# Patient Record
Sex: Male | Born: 1970
Health system: Southern US, Community
[De-identification: ages and names within clinical notes are randomized; demographics above are authoritative.]

## PROBLEM LIST (undated history)

## (undated) DIAGNOSIS — E663 Overweight: Secondary | ICD-10-CM

## (undated) DIAGNOSIS — R739 Hyperglycemia, unspecified: Secondary | ICD-10-CM

## (undated) DIAGNOSIS — I483 Typical atrial flutter: Secondary | ICD-10-CM

## (undated) DIAGNOSIS — I4891 Unspecified atrial fibrillation: Secondary | ICD-10-CM

## (undated) DIAGNOSIS — E785 Hyperlipidemia, unspecified: Secondary | ICD-10-CM

## (undated) DIAGNOSIS — I10 Essential (primary) hypertension: Secondary | ICD-10-CM

## (undated) HISTORY — DX: Overweight: E66.3

## (undated) HISTORY — PX: OTHER SURGICAL HISTORY: SHX169

## (undated) HISTORY — DX: Essential (primary) hypertension: I10

## (undated) HISTORY — DX: Hyperlipidemia, unspecified: E78.5

## (undated) HISTORY — DX: Typical atrial flutter: I48.3

## (undated) HISTORY — DX: Hyperglycemia, unspecified: R73.9

---

## 2000-09-11 ENCOUNTER — Emergency Department (HOSPITAL_COMMUNITY): Admission: EM | Admit: 2000-09-11 | Discharge: 2000-09-11 | Payer: Self-pay | Admitting: *Deleted

## 2001-10-15 ENCOUNTER — Emergency Department (HOSPITAL_COMMUNITY): Admission: EM | Admit: 2001-10-15 | Discharge: 2001-10-15 | Payer: Self-pay | Admitting: *Deleted

## 2001-10-15 ENCOUNTER — Encounter: Payer: Self-pay | Admitting: *Deleted

## 2008-09-03 ENCOUNTER — Emergency Department (HOSPITAL_COMMUNITY): Admission: EM | Admit: 2008-09-03 | Discharge: 2008-09-03 | Payer: Self-pay | Admitting: Emergency Medicine

## 2010-11-04 ENCOUNTER — Emergency Department (HOSPITAL_COMMUNITY)
Admission: EM | Admit: 2010-11-04 | Discharge: 2010-11-04 | Disposition: A | Discharge status: Home / Self Care | Payer: 59 | Attending: Emergency Medicine | Admitting: Emergency Medicine

## 2010-11-04 DIAGNOSIS — Y990 Civilian activity done for income or pay: Secondary | ICD-10-CM | POA: Insufficient documentation

## 2010-11-04 DIAGNOSIS — S01501A Unspecified open wound of lip, initial encounter: Secondary | ICD-10-CM | POA: Insufficient documentation

## 2010-11-04 DIAGNOSIS — W268XXA Contact with other sharp object(s), not elsewhere classified, initial encounter: Secondary | ICD-10-CM | POA: Insufficient documentation

## 2010-11-04 DIAGNOSIS — S0180XA Unspecified open wound of other part of head, initial encounter: Secondary | ICD-10-CM | POA: Insufficient documentation

## 2016-04-08 ENCOUNTER — Ambulatory Visit (INDEPENDENT_AMBULATORY_CARE_PROVIDER_SITE_OTHER): Payer: 59 | Admitting: Primary Care

## 2016-04-08 ENCOUNTER — Ambulatory Visit (INDEPENDENT_AMBULATORY_CARE_PROVIDER_SITE_OTHER)
Admission: RE | Admit: 2016-04-08 | Discharge: 2016-04-08 | Disposition: A | Discharge status: Home / Self Care | Payer: 59 | Source: Ambulatory Visit | Attending: Primary Care | Admitting: Primary Care

## 2016-04-08 ENCOUNTER — Encounter: Payer: Self-pay | Admitting: Primary Care

## 2016-04-08 VITALS — BP 152/92 | HR 94 | Temp 98.7°F | Ht 67.0 in | Wt 212.4 lb

## 2016-04-08 DIAGNOSIS — I10 Essential (primary) hypertension: Secondary | ICD-10-CM

## 2016-04-08 DIAGNOSIS — E119 Type 2 diabetes mellitus without complications: Secondary | ICD-10-CM | POA: Insufficient documentation

## 2016-04-08 DIAGNOSIS — R0781 Pleurodynia: Secondary | ICD-10-CM

## 2016-04-08 DIAGNOSIS — R739 Hyperglycemia, unspecified: Secondary | ICD-10-CM | POA: Diagnosis not present

## 2016-04-08 LAB — HEMOGLOBIN A1C: Hgb A1c MFr Bld: 8 % — ABNORMAL HIGH (ref 4.6–6.5)

## 2016-04-08 NOTE — Progress Notes (Signed)
Pre visit review using our clinic review tool, if applicable. No additional management support is needed unless otherwise documented below in the visit note. 

## 2016-04-08 NOTE — Progress Notes (Signed)
Subjective:    Patient ID: Kevin Branch, male    DOB: August 13, 1970, 46 y.o.   MRN: [id number]  HPI  Mr. Lamb is a 46 year old male who presents today to establish care and discuss the problems mentioned below. Will obtain old records.  1) Elevated Blood Pressure Reading: History of elevated blood pressure readings for the past two years. He has a physical through the fire department annually, had BP checked in February which was 140's/80's. He's checking his BP at home and is getting readings of 140-150's/90's. His BP in the clinic today is 152/92.   He endorses an unhealthy lifestyle for 2-3 years, but recently has changed his lifestyle over the past several weeks with healthy diet and exercise. He's already lost 10 pounds. He's recently stopped drinking soda, cut back on fast/fried foods, and is exercising. He denies chest pain, dizziness, fatigue, shortness of breath.  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 He's not sure if he was fasting during these labs. He denies polyuria, dizziness, numbness/tingling.  3) Rib Pain: Located to left rib since a fall during Thanksgiving 2017. He was diagnosed with a contusion. He continues to experience pain to the left lateral ribs. He did have a bad cold in February that caused increased pain to his rib. The pain to his rib is constant.   Review of Systems  Constitutional: Negative for fatigue.  Eyes: Negative for visual disturbance.  Respiratory: Negative for shortness of breath.   Cardiovascular: Negative for chest pain.  Musculoskeletal: Positive for arthralgias.  Neurological: Negative for dizziness and headaches.       Past Medical History:  Diagnosis Date  . Essential hypertension   . Hyperglycemia   . Hyperlipidemia      Social History   Social History  . Marital status: Married    Spouse name: N/A  . Number of children: N/A  . Years of education: N/A    Occupational History  . Not on file.   Social History Main Topics  . Smoking status: Smoker, Current Status Unknown    Types: Cigars  . Smokeless tobacco: Never Used  . Alcohol use Yes  . Drug use: Unknown  . Sexual activity: Not on file   Other Topics Concern  . Not on file   Social History Narrative   Married.   6 children.   Works for the [employer].   Enjoys hunting and fishing.     No past surgical history on file.  Family History  Problem Relation Age of Onset  . Hyperlipidemia Mother   . Heart disease Mother   . Diabetes Mother   . Hyperlipidemia Father     No Known Allergies  No current outpatient prescriptions on file prior to visit.   No current facility-administered medications on file prior to visit.     BP (!) 152/92   Pulse 94   Temp 98.7 F (37.1 C) (Oral)   Ht 5\' 7"  (1.702 m)   Wt 212 lb 6.4 oz (96.3 kg)   SpO2 95%   BMI 33.27 kg/m    Objective:   Physical Exam  Constitutional: He is oriented to person, place, and time. He appears well-nourished.  Neck: Neck supple.  Cardiovascular: Normal rate and regular rhythm.   Pulmonary/Chest: Effort normal and breath sounds normal. He has no wheezes. He has no rales.  Neurological: He is alert and oriented to person, place, and time.  Skin: Skin is warm and dry.  Psychiatric: He has a normal mood and affect.          Assessment & Plan:  Rib Pain:  Located to left lateral ribs since Fall 2017. Exam today with mild tenderness with deep palpation. Suspect recent viral illness exacerbated symptoms. Will check xray today.   Morrie Sheldonlark,Devani Odonnel Kendal, NP

## 2016-04-08 NOTE — Patient Instructions (Addendum)
Complete lab work and xray prior to leaving today. I will notify you of your results once received.   Continue to monitor your blood pressure, record your readings. Make sure you check your blood pressure around the same time of day no more than once daily.  Schedule a follow up visit in three months for re-evaluation of your blood pressure.   It was a pleasure to meet you today! Please don't hesitate to call me with any questions. Welcome to [facility]!   DASH Eating Plan DASH stands for "Dietary Approaches to Stop Hypertension." The DASH eating plan is a healthy eating plan that has been shown to reduce high blood pressure (hypertension). It may also reduce your risk for type 2 diabetes, heart disease, and stroke. The DASH eating plan may also help with weight loss. What are tips for following this plan? General guidelines   Avoid eating more than 2,300 mg (milligrams) of salt (sodium) a day. If you have hypertension, you may need to reduce your sodium intake to 1,500 mg a day.  Limit alcohol intake to no more than 1 drink a day for nonpregnant women and 2 drinks a day for men. One drink equals 12 oz of beer, 5 oz of wine, or 1 oz of hard liquor.  Work with your health care provider to maintain a healthy body weight or to lose weight. Ask what an ideal weight is for you.  Get at least 30 minutes of exercise that causes your heart to beat faster (aerobic exercise) most days of the week. Activities may include walking, swimming, or biking.  Work with your health care provider or diet and nutrition specialist (dietitian) to adjust your eating plan to your individual calorie needs. Reading food labels   Check food labels for the amount of sodium per serving. Choose foods with less than 5 percent of the Daily Value of sodium. Generally, foods with less than 300 mg of sodium per serving fit into this eating plan.  To find whole grains, look for the word "whole" as the first word in the  ingredient list. Shopping   Buy products labeled as "low-sodium" or "no salt added."  Buy fresh foods. Avoid canned foods and premade or frozen meals. Cooking   Avoid adding salt when cooking. Use salt-free seasonings or herbs instead of table salt or sea salt. Check with your health care provider or pharmacist before using salt substitutes.  Do not fry foods. Cook foods using healthy methods such as baking, boiling, grilling, and broiling instead.  Cook with heart-healthy oils, such as olive, canola, soybean, or sunflower oil. Meal planning    Eat a balanced diet that includes:  5 or more servings of fruits and vegetables each day. At each meal, try to fill half of your plate with fruits and vegetables.  Up to 6-8 servings of whole grains each day.  Less than 6 oz of lean meat, poultry, or fish each day. A 3-oz serving of meat is about the same size as a deck of cards. One egg equals 1 oz.  2 servings of low-fat dairy each day.  A serving of nuts, seeds, or beans 5 times each week.  Heart-healthy fats. Healthy fats called Omega-3 fatty acids are found in foods such as flaxseeds and coldwater fish, like sardines, salmon, and mackerel.  Limit how much you eat of the following:  Canned or prepackaged foods.  Food that is high in trans fat, such as fried foods.  Food that is high  in saturated fat, such as fatty meat.  Sweets, desserts, sugary drinks, and other foods with added sugar.  Full-fat dairy products.  Do not salt foods before eating.  Try to eat at least 2 vegetarian meals each week.  Eat more home-cooked food and less restaurant, buffet, and fast food.  When eating at a restaurant, ask that your food be prepared with less salt or no salt, if possible. What foods are recommended? The items listed may not be a complete list. Talk with your dietitian about what dietary choices are best for you. Grains  Whole-grain or whole-wheat bread. Whole-grain or  whole-wheat pasta. Brown rice. Kevin Branch. Quinoa. Bulgur. Whole-grain and low-sodium cereals. Pita bread. Low-fat, low-sodium crackers. Whole-wheat flour tortillas. Vegetables  Fresh or frozen vegetables (raw, steamed, roasted, or grilled). Low-sodium or reduced-sodium tomato and vegetable juice. Low-sodium or reduced-sodium tomato sauce and tomato paste. Low-sodium or reduced-sodium canned vegetables. Fruits  All fresh, dried, or frozen fruit. Canned fruit in natural juice (without added sugar). Meat and other protein foods  Skinless chicken or Kevin Branch. Ground chicken or Kevin Branch. Pork with fat trimmed off. Fish and seafood. Egg whites. Dried beans, peas, or lentils. Unsalted nuts, nut butters, and seeds. Unsalted canned beans. Lean cuts of beef with fat trimmed off. Low-sodium, lean deli meat. Dairy  Low-fat (1%) or fat-free (skim) milk. Fat-free, low-fat, or reduced-fat cheeses. Nonfat, low-sodium ricotta or cottage cheese. Low-fat or nonfat yogurt. Low-fat, low-sodium cheese. Fats and oils  Soft margarine without trans fats. Vegetable oil. Low-fat, reduced-fat, or light mayonnaise and salad dressings (reduced-sodium). Canola, safflower, olive, soybean, and sunflower oils. Avocado. Seasoning and other foods  Herbs. Spices. Seasoning mixes without salt. Unsalted popcorn and pretzels. Fat-free sweets. What foods are not recommended? The items listed may not be a complete list. Talk with your dietitian about what dietary choices are best for you. Grains  Baked goods made with fat, such as croissants, muffins, or some breads. Dry pasta or rice meal packs. Vegetables  Creamed or fried vegetables. Vegetables in a cheese sauce. Regular canned vegetables (not low-sodium or reduced-sodium). Regular canned tomato sauce and paste (not low-sodium or reduced-sodium). Regular tomato and vegetable juice (not low-sodium or reduced-sodium). Kevin Branch. Olives. Fruits  Canned fruit in a light or heavy syrup. Fried  fruit. Fruit in cream or butter sauce. Meat and other protein foods  Fatty cuts of meat. Ribs. Fried meat. Kevin Branch. Sausage. Bologna and other processed lunch meats. Salami. Fatback. Hotdogs. Bratwurst. Salted nuts and seeds. Canned beans with added salt. Canned or smoked fish. Whole eggs or egg yolks. Chicken or Kevin Branch with skin. Dairy  Whole or 2% milk, cream, and half-and-half. Whole or full-fat cream cheese. Whole-fat or sweetened yogurt. Full-fat cheese. Nondairy creamers. Whipped toppings. Processed cheese and cheese spreads. Fats and oils  Butter. Stick margarine. Lard. Shortening. Ghee. Bacon fat. Tropical oils, such as coconut, palm kernel, or palm oil. Seasoning and other foods  Salted popcorn and pretzels. Onion salt, garlic salt, seasoned salt, table salt, and sea salt. Worcestershire sauce. Tartar sauce. Barbecue sauce. Teriyaki sauce. Soy sauce, including reduced-sodium. Steak sauce. Canned and packaged gravies. Fish sauce. Oyster sauce. Cocktail sauce. Horseradish that you find on the shelf. Ketchup. Mustard. Meat flavorings and tenderizers. Bouillon cubes. Hot sauce and Tabasco sauce. Premade or packaged marinades. Premade or packaged taco seasonings. Relishes. Regular salad dressings. Where to find more information:  National Heart, Lung, and Blood Institute: PopSteam.iswww.nhlbi.nih.gov  American Heart Association: www.heart.org Summary  The DASH eating plan is a healthy  eating plan that has been shown to reduce high blood pressure (hypertension). It may also reduce your risk for type 2 diabetes, heart disease, and stroke.  With the DASH eating plan, you should limit salt (sodium) intake to 2,300 mg a day. If you have hypertension, you may need to reduce your sodium intake to 1,500 mg a day.  When on the DASH eating plan, aim to eat more fresh fruits and vegetables, whole grains, lean proteins, low-fat dairy, and heart-healthy fats.  Work with your health care provider or diet and  nutrition specialist (dietitian) to adjust your eating plan to your individual calorie needs. This information is not intended to replace advice given to you by your health care provider. Make sure you discuss any questions you have with your health care provider. Document Released: 12/18/2010 Document Revised: 12/23/2015 Document Reviewed: 12/23/2015 Elsevier Interactive Patient Education  2017 [employer].

## 2016-04-08 NOTE — Assessment & Plan Note (Signed)
Glucose of 153 on labs, thinks he might have fasted. Will check A1C today. Encouraged healthy lifestyle through exercise and diet. Continue to monitor.

## 2016-04-08 NOTE — Assessment & Plan Note (Signed)
Above goal during home readings and also today. He is very motivated to work on weight loss through healthy diet and exercise. Will allow him three months to work on this, follow up in three months for re-evaluation.

## 2016-04-09 ENCOUNTER — Other Ambulatory Visit: Payer: Self-pay | Admitting: Primary Care

## 2016-04-09 DIAGNOSIS — E119 Type 2 diabetes mellitus without complications: Secondary | ICD-10-CM

## 2016-04-09 MED ORDER — METFORMIN HCL 500 MG PO TABS: 500.0000 mg | ORAL_TABLET | Freq: Two times a day (BID) | ORAL | 1 refills | Status: DC

## 2016-07-03 ENCOUNTER — Ambulatory Visit (INDEPENDENT_AMBULATORY_CARE_PROVIDER_SITE_OTHER): Payer: 59 | Admitting: Primary Care

## 2016-07-03 ENCOUNTER — Encounter: Payer: Self-pay | Admitting: Primary Care

## 2016-07-03 VITALS — BP 142/82 | HR 83 | Temp 98.0°F | Ht 67.0 in | Wt 196.4 lb

## 2016-07-03 DIAGNOSIS — E119 Type 2 diabetes mellitus without complications: Secondary | ICD-10-CM

## 2016-07-03 DIAGNOSIS — E785 Hyperlipidemia, unspecified: Secondary | ICD-10-CM

## 2016-07-03 DIAGNOSIS — I10 Essential (primary) hypertension: Secondary | ICD-10-CM

## 2016-07-03 LAB — LIPID PANEL
Cholesterol: 151 mg/dL (ref 0–200)
HDL: 45.2 mg/dL (ref >39.00)
LDL Cholesterol: 92 mg/dL (ref 0–99)
NonHDL: 105.85
Total CHOL/HDL Ratio: 3
Triglycerides: 70 mg/dL (ref 0.0–149.0)
VLDL: 14 mg/dL (ref 0.0–40.0)

## 2016-07-03 LAB — MICROALBUMIN / CREATININE URINE RATIO
Creatinine,U: 26.7 mg/dL
Microalb Creat Ratio: 19.5 mg/g (ref 0.0–30.0)
Microalb, Ur: 5.2 mg/dL — ABNORMAL HIGH (ref 0.0–1.9)

## 2016-07-03 LAB — HEMOGLOBIN A1C: Hgb A1c MFr Bld: 6.2 % (ref 4.6–6.5)

## 2016-07-03 NOTE — Assessment & Plan Note (Signed)
Improved with weight loss, not quite at goal. If urine microalbumin negative then will have him continue to work on diet and exercise; if positive then add in ACE.  Follow up in 3 months for BP check. Home readings have significantly improved.

## 2016-07-03 NOTE — Patient Instructions (Signed)
Complete lab work prior to leaving today. I will notify you of your results once received.   Continue to monitor your blood pressure.  Congratulations on your weight loss!  Continue exercising. You should be getting 150 minutes of moderate intensity exercise weekly.  Continue to work on improvements in your diet.  Follow up in 3 months for blood pressure check.  It was a pleasure to see you today!

## 2016-07-03 NOTE — Assessment & Plan Note (Signed)
Due for repeat A1C today. Never filled Metformin. Commended him on his weight loss through healthy diet and exercise. Urine microalbumin pending. Foot exam unremarkable. Lipids pending.

## 2016-07-03 NOTE — Progress Notes (Signed)
Subjective:    Patient ID: Basilio CairoSidney R Gonce, male    DOB: 01-29-1970, 46 y.o.   MRN: [id number]  HPI  Mr. Elliot GurneyWoody is a 46 year old male who presents today for follow up.  1) Essential Hypertension: Evaluated last in late March 2018 with elevated readings. He wanted to work on lifestyle changes rather than starting medication and is here today for follow up.  His BP in the office today is 142/82. He's also lost 16 pounds through healthy diet and regular exercise. He is still very motivated to further lower his BP. He's been checking his BP at home which is running 130's/70-80's. He denies headaches, chest pain, shortness of breath.   Wt Readings from Last 3 Encounters:  07/03/16 196 lb 6.4 oz (89.1 kg)  04/08/16 212 lb 6.4 oz (96.3 kg)     BP Readings from Last 3 Encounters:  07/03/16 (!) 142/82  04/08/16 (!) 152/92    2) Type 2 Diabetes: Currently prescribed Metformin 500 mg BID. Last A1C of 8.0, no prior history of diabetes. He's working on lifestyle changes through healthy diet/exercise and has lost 16 pounds. He is due for repeat A1C today. He's checking his blood sugars twice daily. His fasting sugars were running 80-90's when working out, now running 110-115 when he doesn't work out. He's taking natural supplements. He never filled the Metformin as prescribed. He's stopped dipping tobacco and drinking alcohol since his last visit. He denies numbness/tingling, fatigue, polyuria.  Review of Systems  Constitutional: Negative for fatigue.  Respiratory: Negative for shortness of breath.   Cardiovascular: Negative for chest pain.  Neurological: Negative for dizziness, numbness and headaches.       Past Medical History:  Diagnosis Date  . Essential hypertension   . Hyperglycemia   . Hyperlipidemia      Social History   Social History  . Marital status: Married    Spouse name: N/A  . Number of children: N/A  . Years of education: N/A   Occupational History  . Not on file.     Social History Main Topics  . Smoking status: Former Smoker    Types: Cigars  . Smokeless tobacco: Never Used  . Alcohol use Yes  . Drug use: Unknown  . Sexual activity: Not on file   Other Topics Concern  . Not on file   Social History Narrative   Married.   6 children.   Works for the [employer].   Enjoys hunting and fishing.     No past surgical history on file.  Family History  Problem Relation Age of Onset  . Hyperlipidemia Mother   . Heart disease Mother   . Diabetes Mother   . Hyperlipidemia Father     No Known Allergies  Current Outpatient Prescriptions on File Prior to Visit  Medication Sig Dispense Refill  . metFORMIN (GLUCOPHAGE) 500 MG tablet Take 1 tablet (500 mg total) by mouth 2 (two) times daily with a meal. (Patient not taking: Reported on 07/03/2016) 180 tablet 1   No current facility-administered medications on file prior to visit.     BP (!) 142/82   Pulse 83   Temp 98 F (36.7 C) (Oral)   Ht 5\' 7"  (1.702 m)   Wt 196 lb 6.4 oz (89.1 kg)   SpO2 96%   BMI 30.76 kg/m    Objective:   Physical Exam  Constitutional: He appears well-nourished.  Neck: Neck supple.  Cardiovascular: Normal rate and regular rhythm.  Pulmonary/Chest: Effort normal and breath sounds normal.  Skin: Skin is warm and dry.  Fungal involvement to nearly all toenails          Assessment & Plan:

## 2016-07-03 NOTE — Assessment & Plan Note (Addendum)
LDL from labs in February 2018 slightly above goal. Repeat lipids pending.

## 2016-07-08 ENCOUNTER — Encounter: Payer: Self-pay | Admitting: *Deleted

## 2016-09-16 ENCOUNTER — Ambulatory Visit: Payer: 59 | Admitting: Primary Care

## 2016-09-22 ENCOUNTER — Encounter: Payer: Self-pay | Admitting: Primary Care

## 2016-09-22 ENCOUNTER — Ambulatory Visit (INDEPENDENT_AMBULATORY_CARE_PROVIDER_SITE_OTHER): Payer: 59 | Admitting: Primary Care

## 2016-09-22 VITALS — BP 140/78 | HR 75 | Temp 98.1°F | Ht 67.0 in | Wt 210.1 lb

## 2016-09-22 DIAGNOSIS — I10 Essential (primary) hypertension: Secondary | ICD-10-CM

## 2016-09-22 MED ORDER — LISINOPRIL 10 MG PO TABS: 10.0000 mg | ORAL_TABLET | Freq: Every day | ORAL | 0 refills | Status: DC

## 2016-09-22 NOTE — Progress Notes (Signed)
   Subjective:    Patient ID: Kevin Branch, male    DOB: 06/22/1970, 46 y.o.   MRN: [id number]  HPI  Kevin Branch is a 46 year old male who presents today who presents today for follow up of hypertension. Originally evaluated in March 2018 with numerous elevated readings, including home readings. He wanted to work on weight loss through diet and exercise rather than starting medication. During his follow up visit in June 2018 his blood pressure was still slightly above goal, but overall improved. It was recommended he start a low dose lisinopril given positive urine microalbumin, diabetes, and elevated blood pressure readings. He chose not to start lisinopril   His BP in the office today is 140/78. He has put on some weight since his last visit as he had a back injury and has not been exercising. He has also made poor choices with his diet. He's checking his BP infrequently at home, last reading two days ago was 138/82. He denies chest pain, dizziness, shortness of breath, headaches.  Review of Systems  Eyes: Negative for visual disturbance.  Respiratory: Negative for shortness of breath.   Cardiovascular: Negative for chest pain.  Neurological: Negative for dizziness and headaches.       Past Medical History:  Diagnosis Date  . Essential hypertension   . Hyperglycemia   . Hyperlipidemia      Social History   Social History  . Marital status: Married    Spouse name: N/A  . Number of children: N/A  . Years of education: N/A   Occupational History  . Not on file.   Social History Main Topics  . Smoking status: Former Smoker    Types: Cigars  . Smokeless tobacco: Never Used  . Alcohol use Yes  . Drug use: Unknown  . Sexual activity: Not on file   Other Topics Concern  . Not on file   Social History Narrative   Married.   6 children.   Works for the [employer].   Enjoys hunting and fishing.     No past surgical history on file.  Family History    Problem Relation Age of Onset  . Hyperlipidemia Mother   . Heart disease Mother   . Diabetes Mother   . Hyperlipidemia Father     No Known Allergies  No current outpatient prescriptions on file prior to visit.   No current facility-administered medications on file prior to visit.     BP 140/78   Pulse 75   Temp 98.1 F (36.7 C) (Oral)   Ht 5\' 7"  (1.702 m)   Wt 210 lb 1.9 oz (95.3 kg)   SpO2 96%   BMI 32.91 kg/m    Objective:   Physical Exam  Constitutional: He appears well-nourished.  Neck: Neck supple.  Cardiovascular: Normal rate and regular rhythm.   Pulmonary/Chest: Effort normal and breath sounds normal.  Skin: Skin is warm and dry.          Assessment & Plan:

## 2016-09-22 NOTE — Patient Instructions (Addendum)
Start lisinopril 10 mg tablets for high blood pressure and kidney protection. Take 1 tablet by mouth every morning.  Check your blood pressure daily, around the same time of day, for the next 2 weeks.  Ensure that you have rested for 30 minutes prior to checking your blood pressure. Record your readings and I'll call you in two weeks for readings.  Continue to work on weight loss through healthy diet and exercise.   Schedule a follow up visit in 3 months for re-evaluation of diabetes and blood pressure.  It was a pleasure to see you today!  Diabetes Mellitus and Food It is important for you to manage your blood sugar (glucose) level. Your blood glucose level can be greatly affected by what you eat. Eating healthier foods in the appropriate amounts throughout the day at about the same time each day will help you control your blood glucose level. It can also help slow or prevent worsening of your diabetes mellitus. Healthy eating may even help you improve the level of your blood pressure and reach or maintain a healthy weight. General recommendations for healthful eating and cooking habits include:  Eating meals and snacks regularly. Avoid going long periods of time without eating to lose weight.  Eating a diet that consists mainly of plant-based foods, such as fruits, vegetables, nuts, legumes, and whole grains.  Using low-heat cooking methods, such as baking, instead of high-heat cooking methods, such as deep frying.  Work with your dietitian to make sure you understand how to use the Nutrition Facts information on food labels. How can food affect me? Carbohydrates Carbohydrates affect your blood glucose level more than any other type of food. Your dietitian will help you determine how many carbohydrates to eat at each meal and teach you how to count carbohydrates. Counting carbohydrates is important to keep your blood glucose at a healthy level, especially if you are using insulin or taking  certain medicines for diabetes mellitus. Alcohol Alcohol can cause sudden decreases in blood glucose (hypoglycemia), especially if you use insulin or take certain medicines for diabetes mellitus. Hypoglycemia can be a life-threatening condition. Symptoms of hypoglycemia (sleepiness, dizziness, and disorientation) are similar to symptoms of having too much alcohol. If your health care provider has given you approval to drink alcohol, do so in moderation and use the following guidelines:  Women should not have more than one drink per day, and men should not have more than two drinks per day. One drink is equal to: ? 12 oz of beer. ? 5 oz of wine. ? 1 oz of hard liquor.  Do not drink on an empty stomach.  Keep yourself hydrated. Have water, diet soda, or unsweetened iced tea.  Regular soda, juice, and other mixers might contain a lot of carbohydrates and should be counted.  What foods are not recommended? As you make food choices, it is important to remember that all foods are not the same. Some foods have fewer nutrients per serving than other foods, even though they might have the same number of calories or carbohydrates. It is difficult to get your body what it needs when you eat foods with fewer nutrients. Examples of foods that you should avoid that are high in calories and carbohydrates but low in nutrients include:  Trans fats (most processed foods list trans fats on the Nutrition Facts label).  Regular soda.  Juice.  Candy.  Sweets, such as cake, pie, doughnuts, and cookies.  Fried foods.  What foods can I  eat? Eat nutrient-rich foods, which will nourish your body and keep you healthy. The food you should eat also will depend on several factors, including:  The calories you need.  The medicines you take.  Your weight.  Your blood glucose level.  Your blood pressure level.  Your cholesterol level.  You should eat a variety of foods, including:  Protein. ? Lean  cuts of meat. ? Proteins low in saturated fats, such as fish, egg whites, and beans. Avoid processed meats.  Fruits and vegetables. ? Fruits and vegetables that may help control blood glucose levels, such as apples, mangoes, and yams.  Dairy products. ? Choose fat-free or low-fat dairy products, such as milk, yogurt, and cheese.  Grains, bread, pasta, and rice. ? Choose whole grain products, such as multigrain bread, whole oats, and brown rice. These foods may help control blood pressure.  Fats. ? Foods containing healthful fats, such as nuts, avocado, olive oil, canola oil, and fish.  Does everyone with diabetes mellitus have the same meal plan? Because every person with diabetes mellitus is different, there is not one meal plan that works for everyone. It is very important that you meet with a dietitian who will help you create a meal plan that is just right for you. This information is not intended to replace advice given to you by your health care provider. Make sure you discuss any questions you have with your health care provider. Document Released: 09/25/2004 Document Revised: 06/06/2015 Document Reviewed: 11/25/2012 Elsevier Interactive Patient Education  2017 [employer]

## 2016-09-22 NOTE — Assessment & Plan Note (Signed)
Above goal in the office today, has not been exercising or eating healthy. Discussed long term effects of hypertension and kidney damage from diabetes. He is agreeable to starting lisinopril, sent to pharmacy. Will call him for readings in 2 weeks. BMP in 2 weeks. Follow up in 3 months.

## 2016-10-06 ENCOUNTER — Telehealth: Payer: Self-pay | Admitting: Primary Care

## 2016-10-06 NOTE — Telephone Encounter (Addendum)
-----   Message from Doreene Nest, NP sent at 09/22/2016  3:04 PM EDT ----- Regarding: BP Please check on patient's BP since we started Lisinopril.Marland Kitchen

## 2016-10-06 NOTE — Telephone Encounter (Signed)
Per DPR, left detail message of Kate's comments for patient to call back. 

## 2016-10-09 ENCOUNTER — Encounter: Payer: Self-pay | Admitting: *Deleted

## 2016-10-09 NOTE — Telephone Encounter (Signed)
Letter mailed

## 2017-01-14 ENCOUNTER — Ambulatory Visit: Payer: 59 | Admitting: Primary Care

## 2017-06-27 IMAGING — DX DG RIBS 2V*L*
4 series · 5 of 5 positions shown · non-contrast
Comparison: None.

CLINICAL DATA: Left rib pain after fall last year.

EXAM:
LEFT RIBS - 2 VIEW

[Series 2: rib obl · 0.14mm/px · 2 of 2 slices shown]
[im 1/2]
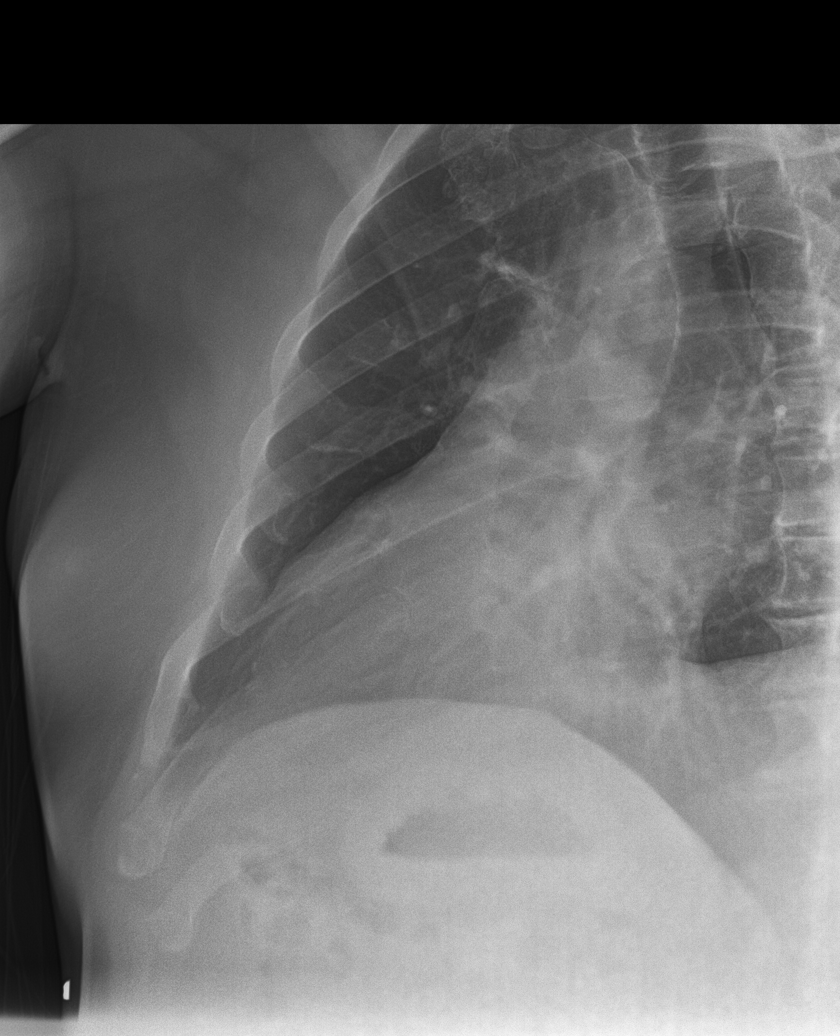
[im 2/2]
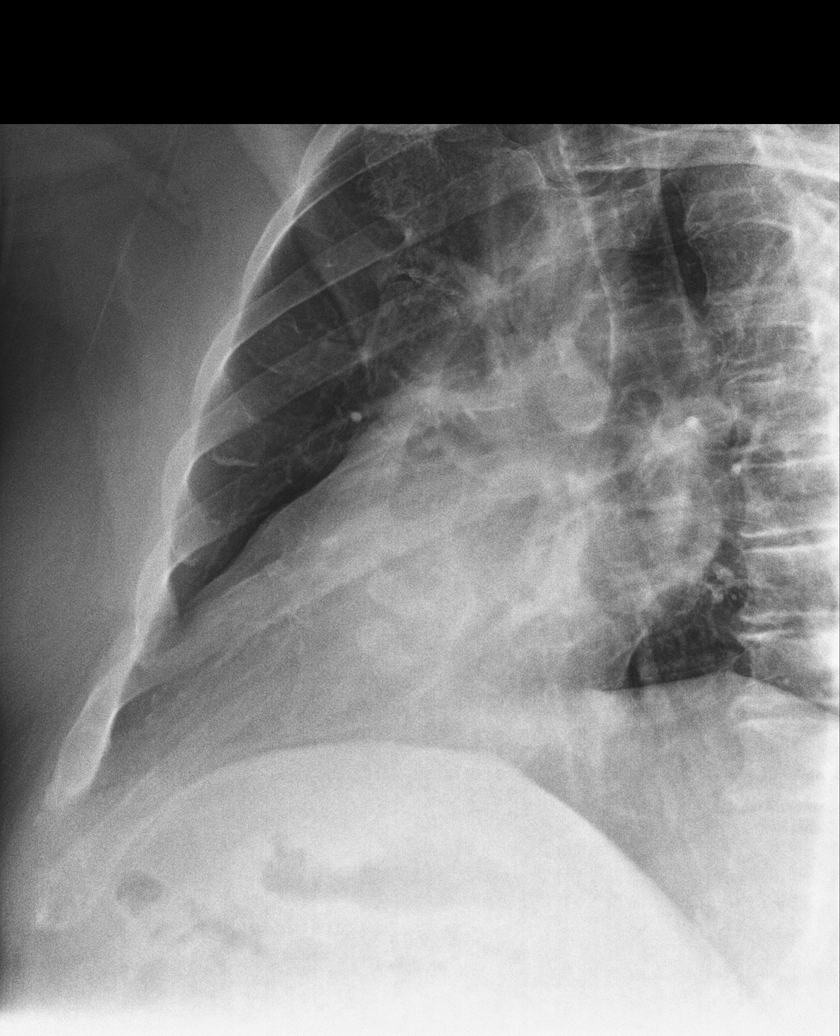

[chest pa]
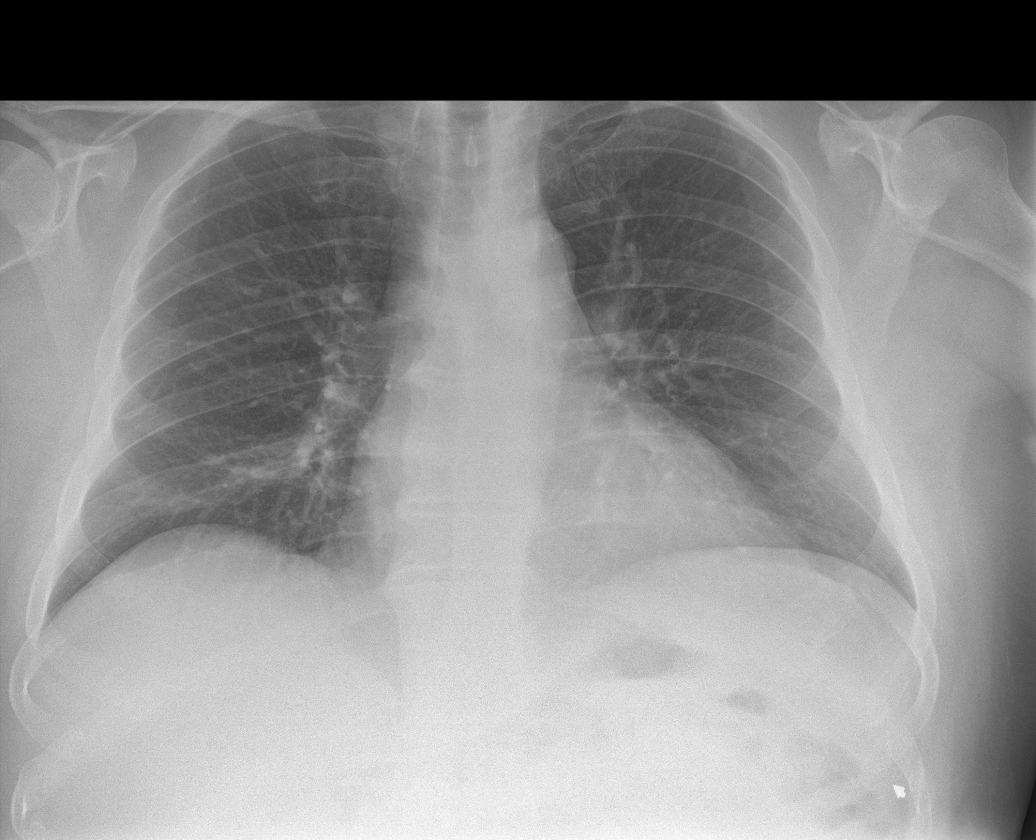

[rib ap (1 of 2)]
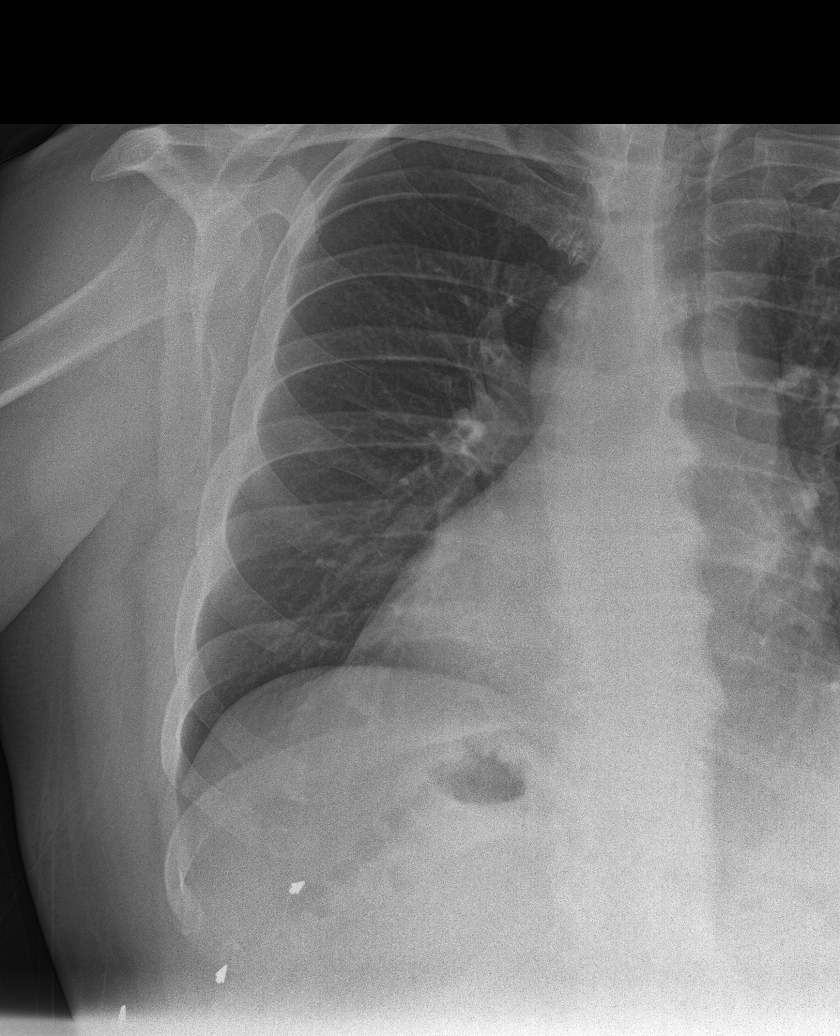

[rib ap (2 of 2)]
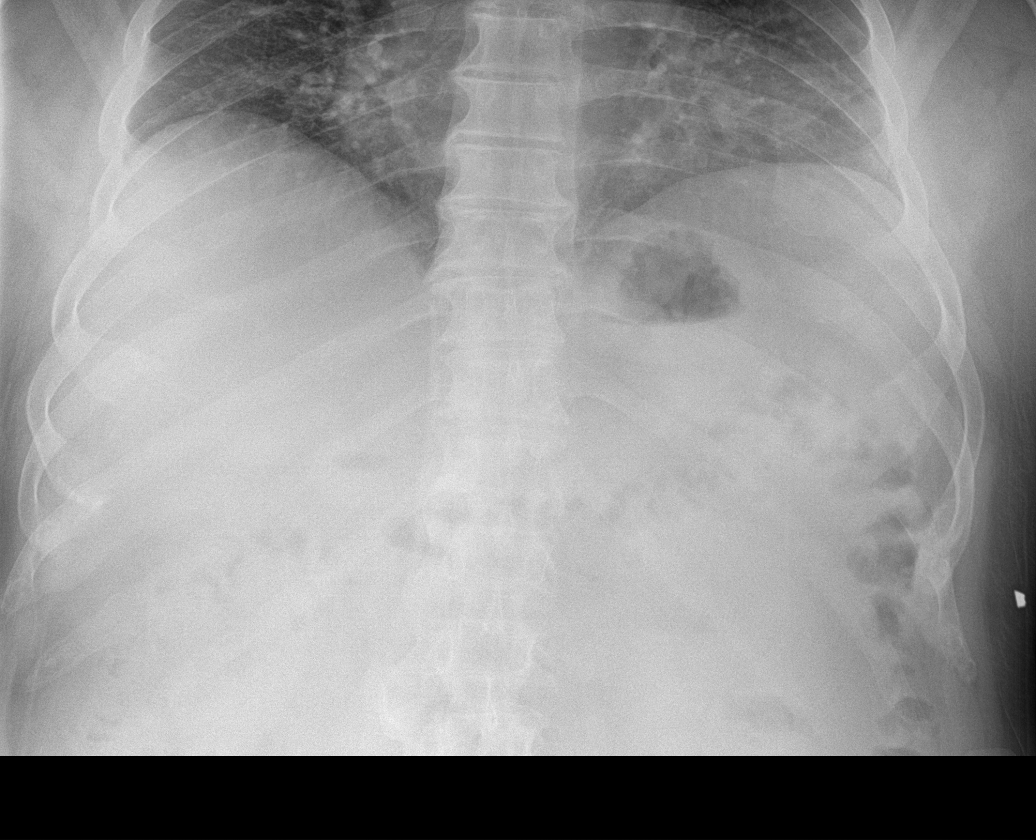

[5 of 5 positions shown; findings below may reference images not displayed]

FINDINGS: There is the suggestion of possibly minimally displaced fracture
involving the lateral portion of the left eleventh rib. No
pneumothorax or pleural effusion is noted.
IMPRESSION: Possible minimally displaced eleventh eleventh rib fracture of
indeterminate age.

## 2017-09-14 DIAGNOSIS — B0239 Other herpes zoster eye disease: Secondary | ICD-10-CM | POA: Diagnosis not present

## 2017-09-20 DIAGNOSIS — B0239 Other herpes zoster eye disease: Secondary | ICD-10-CM | POA: Diagnosis not present

## 2017-09-22 DIAGNOSIS — B0239 Other herpes zoster eye disease: Secondary | ICD-10-CM | POA: Diagnosis not present

## 2017-09-27 DIAGNOSIS — B0239 Other herpes zoster eye disease: Secondary | ICD-10-CM | POA: Diagnosis not present

## 2018-03-10 DIAGNOSIS — N529 Male erectile dysfunction, unspecified: Secondary | ICD-10-CM | POA: Diagnosis not present

## 2019-02-10 ENCOUNTER — Emergency Department (HOSPITAL_COMMUNITY): Payer: 59

## 2019-02-10 ENCOUNTER — Emergency Department (HOSPITAL_COMMUNITY)
Admission: EM | Admit: 2019-02-10 | Discharge: 2019-02-10 | Disposition: A | Discharge status: Home / Self Care | Payer: 59 | Attending: Emergency Medicine | Admitting: Emergency Medicine

## 2019-02-10 ENCOUNTER — Other Ambulatory Visit: Payer: Self-pay

## 2019-02-10 DIAGNOSIS — I483 Typical atrial flutter: Secondary | ICD-10-CM | POA: Insufficient documentation

## 2019-02-10 DIAGNOSIS — E119 Type 2 diabetes mellitus without complications: Secondary | ICD-10-CM | POA: Insufficient documentation

## 2019-02-10 DIAGNOSIS — I10 Essential (primary) hypertension: Secondary | ICD-10-CM | POA: Diagnosis not present

## 2019-02-10 DIAGNOSIS — Z79899 Other long term (current) drug therapy: Secondary | ICD-10-CM | POA: Insufficient documentation

## 2019-02-10 DIAGNOSIS — Z7901 Long term (current) use of anticoagulants: Secondary | ICD-10-CM | POA: Insufficient documentation

## 2019-02-10 DIAGNOSIS — R002 Palpitations: Secondary | ICD-10-CM | POA: Diagnosis present

## 2019-02-10 LAB — BASIC METABOLIC PANEL
Anion gap: 8 (ref 5–15)
BUN: 12 mg/dL (ref 6–20)
CO2: 25 mmol/L (ref 22–32)
Calcium: 8.6 mg/dL — ABNORMAL LOW (ref 8.9–10.3)
Chloride: 104 mmol/L (ref 98–111)
Creatinine, Ser: 0.47 mg/dL — ABNORMAL LOW (ref 0.61–1.24)
GFR calc Af Amer: >60 mL/min (ref >60)
GFR calc non Af Amer: >60 mL/min (ref >60)
Glucose, Bld: 248 mg/dL — ABNORMAL HIGH (ref 70–99)
Potassium: 3.6 mmol/L (ref 3.5–5.1)
Sodium: 137 mmol/L (ref 135–145)

## 2019-02-10 LAB — CBC
HCT: 39.6 % (ref 39.0–52.0)
Hemoglobin: 13.7 g/dL (ref 13.0–17.0)
MCH: 31.6 pg (ref 26.0–34.0)
MCHC: 34.6 g/dL (ref 30.0–36.0)
MCV: 91.5 fL (ref 80.0–100.0)
Platelets: 171 10*3/uL (ref 150–400)
RBC: 4.33 MIL/uL (ref 4.22–5.81)
RDW: 13.2 % (ref 11.5–15.5)
WBC: 5.4 10*3/uL (ref 4.0–10.5)
nRBC: 0 % (ref 0.0–0.2)

## 2019-02-10 LAB — TROPONIN I (HIGH SENSITIVITY): Troponin I (High Sensitivity): 17 ng/L (ref <18)

## 2019-02-10 LAB — MAGNESIUM: Magnesium: 1.9 mg/dL (ref 1.7–2.4)

## 2019-02-10 LAB — D-DIMER, QUANTITATIVE: D-Dimer, Quant: 0.43 ug/mL-FEU (ref 0.00–0.50)

## 2019-02-10 MED ORDER — SODIUM CHLORIDE 0.9% FLUSH
3.0000 mL | Freq: Once | INTRAVENOUS | Status: AC
  Administered 2019-02-10: 16:00:00 3 mL via INTRAVENOUS

## 2019-02-10 MED ORDER — METOPROLOL TARTRATE 25 MG PO TABS: 25.0000 mg | ORAL_TABLET | Freq: Once | ORAL | Status: DC

## 2019-02-10 MED ORDER — SODIUM CHLORIDE 0.9 % IV BOLUS
1000.0000 mL | Freq: Once | INTRAVENOUS | Status: AC
  Administered 2019-02-10: 1000 mL via INTRAVENOUS

## 2019-02-10 MED ORDER — ETOMIDATE 2 MG/ML IV SOLN
10.0000 mg | Freq: Once | INTRAVENOUS | Status: DC
  Filled 2019-02-10: qty 10

## 2019-02-10 MED ORDER — DILTIAZEM HCL 25 MG/5ML IV SOLN
20.0000 mg | Freq: Once | INTRAVENOUS | Status: DC
  Filled 2019-02-10: qty 5

## 2019-02-10 MED ORDER — ETOMIDATE 2 MG/ML IV SOLN
INTRAVENOUS | Status: AC | PRN
  Administered 2019-02-10: 10 mg via INTRAVENOUS

## 2019-02-10 MED ORDER — DILTIAZEM HCL ER COATED BEADS 120 MG PO CP24: 120.0000 mg | ORAL_CAPSULE | Freq: Every day | ORAL | 0 refills | Status: DC

## 2019-02-10 MED ORDER — DILTIAZEM HCL 30 MG PO TABS: 30.0000 mg | ORAL_TABLET | Freq: Two times a day (BID) | ORAL | 0 refills | Status: DC | PRN

## 2019-02-10 MED ORDER — APIXABAN 5 MG PO TABS: 5.0000 mg | ORAL_TABLET | Freq: Two times a day (BID) | ORAL | 0 refills | Status: DC

## 2019-02-10 MED ORDER — ADENOSINE 6 MG/2ML IV SOLN
6.0000 mg | Freq: Once | INTRAVENOUS | Status: AC
  Administered 2019-02-10: 6 mg via INTRAVENOUS
  Filled 2019-02-10: qty 2

## 2019-02-10 MED ORDER — APIXABAN 5 MG PO TABS
5.0000 mg | ORAL_TABLET | Freq: Two times a day (BID) | ORAL | Status: DC
  Filled 2019-02-10: qty 1

## 2019-02-10 NOTE — ED Triage Notes (Signed)
Pt here from home with c/o tachycardia and htn , no chest pain some sob ., just recently finished antibiotics

## 2019-02-10 NOTE — ED Provider Notes (Addendum)
Physical Exam  Ht 5\' 8"  (1.727 m)   Wt 88.9 kg   BMI 29.80 kg/m   Physical Exam  ED Course/Procedures   Clinical Course as of Feb 09 1798  Fri Feb 10, 2019  1615 After adenosine 6 mg, patient demonstrating flutter waves on strip, will give diltiazem bolus, c/s cards   [MT]  1619 Paged dr [name]   [MT]  [place] Chads vasc 1   [MT]  1648 Spoke to Dr. [name] who is okay with attempting cardioversion even with this time frame (3 days symptoms) given low chads vasc score (1).  Patient will need A/C after cardioversion if he is being discharged.  Patient consented for cardioversion and currently being consented for procedural sedation by Dr Kathrynn Humble. Labs resulted, Cr okay.   [MT]  2951 Trop suspect demand ischemia from tachycardia, low suspicion for ACS or PE given this clinical picture.   [MT]  1725 Successful cardioversion to sinus tachycardia rhythm HR 110   [MT]    Clinical Course User Index [MT] Wyvonnia Dusky, MD    .Sedation  Date/Time: 02/10/2019 6:01 PM Performed by: Varney Biles, MD Authorized by: Varney Biles, MD   Consent:    Consent obtained:  Written   Consent given by:  Patient   Risks discussed:  Allergic reaction, dysrhythmia, inadequate sedation, nausea, prolonged hypoxia resulting in organ damage, prolonged sedation necessitating reversal, respiratory compromise necessitating ventilatory assistance and intubation and vomiting   Alternatives discussed:  Analgesia without sedation, anxiolysis and regional anesthesia Universal protocol:    Procedure explained and questions answered to patient or proxy's satisfaction: yes     Relevant documents present and verified: yes     Test results available and properly labeled: yes     Imaging studies available: yes     Required blood products, implants, devices, and special equipment available: yes     Site/side marked: yes     Immediately prior to procedure a time out was called: yes     Patient  identity confirmation method:  Verbally with patient Indications:    Procedure performed:  Cardioversion   Procedure necessitating sedation performed by:  Different physician Pre-sedation assessment:    Time since last food or drink:  4 hours   ASA classification: class 2 - patient with mild systemic disease     Neck mobility: normal     Mouth opening:  3 or more finger widths   Thyromental distance:  4 finger widths   Mallampati score:  I - soft palate, uvula, fauces, pillars visible   Pre-sedation assessments completed and reviewed: airway patency, cardiovascular function, hydration status, mental status, nausea/vomiting, pain level, respiratory function and temperature     Pre-sedation assessment completed:  02/10/2019 5:10 PM Immediate pre-procedure details:    Reassessment: Patient reassessed immediately prior to procedure     Reviewed: vital signs, relevant labs/tests and NPO status     Verified: bag valve mask available, emergency equipment available, intubation equipment available, IV patency confirmed, oxygen available and suction available   Procedure details (see MAR for exact dosages):    Preoxygenation:  Nasal cannula   Sedation:  Propofol   Intended level of sedation: deep   Intra-procedure monitoring:  Blood pressure monitoring, cardiac monitor, continuous pulse oximetry, frequent LOC assessments, frequent vital sign checks and continuous capnometry   Intra-procedure events: none     Total Provider sedation time (minutes):  20 Post-procedure details:    Post-sedation assessment completed:  02/10/2019 6:01 PM   Attendance:  Constant attendance by certified staff until patient recovered     Recovery: Patient returned to pre-procedure baseline     Post-sedation assessments completed and reviewed: airway patency, cardiovascular function, hydration status, mental status, nausea/vomiting, pain level, respiratory function and temperature     Patient is stable for discharge or  admission: yes     Patient tolerance:  Tolerated well, no immediate complications    MDM   Assuming care of patient from Dr. Renaye Rakers.   Patient in the ED for palpitations and dib. Patient was given adenosine for his heart rate in the 150s and it appears that his underlying rhythm is a flutter.  Patient is still tachycardic.  Concerning findings are as following: None Important pending results are : All lab work-up and cardiology eval.  According to Dr. Renaye Rakers, plan is to get the labs and consult cardiology. He is amenable to patient getting cardioverted if cardiology approves. [place] vas score is 1.  Patient is quite sure that his symptoms started 3 days ago.  Patient had no complains, no concerns from the nursing side. Will continue to monitor.    Derwood Kaplan, MD 02/10/19 1633  6:02 PM Successfully cardioverted. We will order 25 metoprolol as patient is in sinus tachycardia now.  We will also order Eliquis.  Awaiting cardiology to see the patient.  Patient is neurovascularly intact.  Pharmacy has already counseled patient.   7 PM: Cardiology team has cleared the patient.  They recommend that we start him on diltiazem extended release with 30 mg tablet that can be taken as needed.  They are comfortable with the Eliquis that we have started in the ER.  They will give patient a follow-up.  He is stable for discharge at this time.    Derwood Kaplan, MD 02/10/19 [phone]

## 2019-02-10 NOTE — Discharge Instructions (Addendum)
You are seen in the ER for palpitations.  We have cardioverted you to normal rhythm.  Please take prescribed Eliquis to prevent stroke. Please take extended release diltiazem to keep your heart rate in control. Take the additional small dose diltiazem that is prescribed only if you are having palpitations with elevated heart rate.  Return to the ER immediately if you start having palpitations with severe chest pain, dizziness, near fainting.

## 2019-02-10 NOTE — ED Provider Notes (Addendum)
[facility] EMERGENCY DEPARTMENT Provider Note   CSN: [id number] Arrival date & time: 02/10/19  1504     History CC:  palpitations  Kevin Branch is a 49 y.o. male w/ hx of HTN presenting with palpitations and tachycardia.  Noted 3 days ago that his HR jumped to 150, per his apple watch.  It has been persistent at 150 since then.  Normally he runs 80bpm-130bpm with exercise.  No prior hx of arrhythmia or SVT or PE.  He does feel somewhat winded and more tired and notes some left sided chest pressure.  No hx of MI or angina.  No hemoptysis or asymmetric LE edema. Patient denies personal or family history of DVT or PE. No recent hormone use (including OCP); travel for >6 hours; prolonged immobilization for greater than 3 days; surgeries or trauma in the last 4 weeks; or malignancy with treatment within 6 months.  Does not smoke No family cardiac hx   HPI     Past Medical History:  Diagnosis Date  . Essential hypertension   . Hyperglycemia   . Hyperlipidemia     Patient Active Problem List   Diagnosis Date Noted  . Hyperlipidemia 07/03/2016  . Essential hypertension 04/08/2016  . Type 2 diabetes mellitus (HCC) 04/08/2016     Family History  Problem Relation Age of Onset  . Hyperlipidemia Mother   . Heart disease Mother   . Diabetes Mother   . Hyperlipidemia Father     Social History   Tobacco Use  . Smoking status: Former Smoker    Types: Cigars  . Smokeless tobacco: Never Used  Substance Use Topics  . Alcohol use: Yes  . Drug use: Not on file    Home Medications Prior to Admission medications   Medication Sig Start Date End Date Taking? Authorizing Provider  Ascorbic Acid (VITAMIN C) 1000 MG tablet Take 1,000 mg by mouth daily.   Yes [provider]  Multiple Vitamins-Minerals (ONE-A-DAY VITACRAVES ADULT PO) Take 1 tablet by mouth daily.   Yes [provider]  amoxicillin-clavulanate (AUGMENTIN) 875-125 MG tablet Take 1  tablet by mouth every 12 (twelve) hours. 01/31/19   [provider]  apixaban (ELIQUIS) 5 MG TABS tablet Take 1 tablet (5 mg total) by mouth 2 (two) times daily. 02/10/19 03/12/19  Derwood Kaplan, MD  diltiazem (CARDIZEM CD) 120 MG 24 hr capsule Take 1 capsule (120 mg total) by mouth daily. 02/10/19   Derwood Kaplan, MD  diltiazem (CARDIZEM) 30 MG tablet Take 1 tablet (30 mg total) by mouth 2 (two) times daily as needed (for palpitations with heart rate > 110 despite taking the extended release medicine). 02/10/19   Derwood Kaplan, MD  lisinopril (PRINIVIL,ZESTRIL) 10 MG tablet Take 1 tablet (10 mg total) by mouth daily. Patient not taking: Reported on 02/10/2019 09/22/16   Doreene Nest, NP    Allergies    Patient has no known allergies.  Review of Systems   Review of Systems  Constitutional: Negative for chills and fever.  Eyes: Negative for pain and visual disturbance.  Respiratory: Positive for shortness of breath. Negative for cough.   Cardiovascular: Positive for chest pain and palpitations. Negative for leg swelling.  Gastrointestinal: Negative for abdominal pain and vomiting.  Genitourinary: Negative for dysuria and hematuria.  Neurological: Negative for syncope and headaches.  Psychiatric/Behavioral: Negative for confusion.  All other systems reviewed and are negative.   Physical Exam Updated Vital Signs BP (!) 155/83   Pulse Marland Kitchen)  111   Temp 98.7 F (37.1 C) (Oral)   Resp (!) 29   Ht 5\' 8"  (1.727 m)   Wt 88.9 kg   SpO2 97%   BMI 29.80 kg/m   Physical Exam Vitals and nursing note reviewed.  Constitutional:      Appearance: He is well-developed.  HENT:     Head: Normocephalic and atraumatic.  Eyes:     Conjunctiva/sclera: Conjunctivae normal.  Cardiovascular:     Rate and Rhythm: Regular rhythm. Tachycardia present.     Pulses: Normal pulses.  Pulmonary:     Effort: Pulmonary effort is normal. No respiratory distress.     Breath sounds: Normal breath  sounds.  Abdominal:     Palpations: Abdomen is soft.     Tenderness: There is no abdominal tenderness.  Musculoskeletal:        General: No swelling or tenderness.     Cervical back: Neck supple.  Skin:    General: Skin is warm and dry.  Neurological:     General: No focal deficit present.     Mental Status: He is alert and oriented to person, place, and time.     ED Results / Procedures / Treatments   Labs (all labs ordered are listed, but only abnormal results are displayed) Labs Reviewed  BASIC METABOLIC PANEL - Abnormal; Notable for the following components:      Result Value   Glucose, Bld 248 (*)    Creatinine, Ser 0.47 (*)    Calcium 8.6 (*)    All other components within normal limits  CBC  MAGNESIUM  D-DIMER, QUANTITATIVE (NOT AT [facility])  TROPONIN I (HIGH SENSITIVITY)    EKG EKG Interpretation  Date/Time:  Friday February 10 2019 15:09:52 EST Ventricular Rate:  155 PR Interval:  84 QRS Duration: 92 QT Interval:  296 QTC Calculation: 475 R Axis:   31 Text Interpretation: Suspected A Flutter HR 150 Confirmed by Octaviano Glow [phone]) on 02/10/2019 3:16:12 PM   Radiology DG Chest 2 View  Result Date: 02/10/2019 CLINICAL DATA:  Shortness of breath and chest pain EXAM: CHEST - 2 VIEW COMPARISON:  April 08, 2016 FINDINGS: Lungs are clear. Heart size and pulmonary vascularity are normal. No adenopathy. Foci of degenerative change in the thoracic spine. IMPRESSION: Lungs clear.  Cardiac silhouette normal.  No adenopathy. Electronically Signed   By: Lowella Grip III M.D.   On: 02/10/2019 15:48    Procedures .Cardioversion  Date/Time: 02/10/2019 5:25 PM Performed by: Wyvonnia Dusky, MD Authorized by: Wyvonnia Dusky, MD   Consent:    Consent obtained:  Written   Consent given by:  Patient   Alternatives discussed:  Rate-control medication and alternative treatment Pre-procedure details:    Cardioversion basis:  Elective   Rhythm:  Atrial flutter    Electrode placement:  Anterior-posterior Patient sedated: Yes. Refer to sedation procedure documentation for details of sedation.  Attempt one:    Cardioversion mode:  Synchronous   Waveform:  Biphasic   Shock (Joules):  150   Shock outcome:  Conversion to normal sinus rhythm Post-procedure details:    Patient status:  Awake   Patient tolerance of procedure:  Tolerated well, no immediate complications   (including critical care time)  Medications Ordered in ED Medications  etomidate (AMIDATE) injection 10 mg (10 mg Intravenous Not Given 02/10/19 1722)  metoprolol tartrate (LOPRESSOR) tablet 25 mg (has no administration in time range)  apixaban (ELIQUIS) tablet 5 mg (has no administration in time range)  sodium chloride flush (NS) 0.9 % injection 3 mL (3 mLs Intravenous Given 02/10/19 1559)  adenosine (ADENOCARD) 6 MG/2ML injection 6 mg (6 mg Intravenous Given 02/10/19 1625)  sodium chloride 0.9 % bolus 1,000 mL (0 mLs Intravenous Stopped 02/10/19 1723)  etomidate (AMIDATE) injection (10 mg Intravenous Given 02/10/19 1717)    ED Course  I have reviewed the triage vital signs and the nursing notes.  Pertinent labs & imaging results that were available during my care of the patient were reviewed by me and considered in my medical decision making (see chart for details).  49 yo male presenting to ED with palpitations x 3 days (started Tuesday early afternoon), with HR 150 bpm at home on his apple watch.  Here with HR 150, likely A Flutter.  CHADSVASC score of 1.  Labs, electrolyte pending DDimer pending - lower risk for PE without hypoxia or tachypnea, but exertional dyspnea on history, will attempt r/u with dimer Trop ordered at intake, suspect some component of demand ischemia likely, but doubt ACS.  No cardiac risk factors  Low clinical suspicion for PTX or Aortic dissection  Clinical Course as of Feb 10 1903  Fri Feb 10, 2019  1615 After adenosine 6 mg, patient demonstrating  flutter waves on strip, will give diltiazem bolus, c/s cards   [MT]  1619 Paged dr Rosemary Holms   [MT]  1629 Chads vasc 1   [MT]  1648 Spoke to Dr. Rosemary Holms who is okay with attempting cardioversion even with this time frame (3 days symptoms) given low chads vasc score (1).  Patient will need A/C after cardioversion if he is being discharged.  Patient consented for cardioversion and currently being consented for procedural sedation by Dr Rhunette Croft. Labs resulted, Cr okay.   [MT]  1650 Trop suspect demand ischemia from tachycardia, low suspicion for ACS or PE given this clinical picture.   [MT]  1725 Successful cardioversion to sinus tachycardia rhythm HR 110   [MT]    Clinical Course User Index [MT] Travarus Trudo, Kermit Balo, MD    Final Clinical Impression(s) / ED Diagnoses Final diagnoses:  Typical atrial flutter [facility])    Rx / DC Orders ED Discharge Orders         Ordered    apixaban (ELIQUIS) 5 MG TABS tablet  2 times daily     02/10/19 1846    diltiazem (CARDIZEM CD) 120 MG 24 hr capsule  Daily     02/10/19 1846    diltiazem (CARDIZEM) 30 MG tablet  2 times daily PRN     02/10/19 1846           Terald Sleeper, MD 02/10/19 1856    Terald Sleeper, MD 02/10/19 1905

## 2019-02-10 NOTE — Progress Notes (Signed)
ANTICOAGULATION CONSULT NOTE - Initial Consult  Pharmacy Consult for Apixaban Indication: atrial fibrillation  No Known Allergies  Patient Measurements: Height: 5\' 8"  (172.7 cm) Weight: 196 lb (88.9 kg) IBW/kg (Calculated) : 68.4  Vital Signs: Temp: 98.7 F (37.1 C) (01/29 1714) Temp Source: Oral (01/29 1714) BP: 155/83 (01/29 1815) Pulse Rate: 111 (01/29 1815)  Labs: Recent Labs    02/10/19 1600  HGB 13.7  HCT 39.6  PLT 171  CREATININE 0.47*  TROPONINIHS 17    Estimated Creatinine Clearance: 121 mL/min (A) (by C-G formula based on SCr of 0.47 mg/dL (L)).   Medical History: Past Medical History:  Diagnosis Date  . Essential hypertension   . Hyperglycemia   . Hyperlipidemia     Assessment: 49 y.o. Male presenting with new onset of Afib. Patient was cardioverted and is now starting apixaban. Patient has never been on anticoagulation before. Patients CBC and Scr are all within normal limits. Education completed and AVS placed for new start apixaban.    Plan:  Apixaban 5 mg PO BID  Monitor CBC and renal function  53, PharmD  PGY1 Acute Care Pharmacy Resident 02/10/2019,6:28 PM
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
## 2019-02-22 ENCOUNTER — Encounter: Payer: Self-pay | Admitting: Cardiology

## 2019-02-22 ENCOUNTER — Ambulatory Visit: Payer: 59 | Admitting: Cardiology

## 2019-02-22 ENCOUNTER — Telehealth: Payer: Self-pay

## 2019-02-22 ENCOUNTER — Other Ambulatory Visit: Payer: Self-pay

## 2019-02-22 VITALS — BP 146/87 | HR 100 | Temp 98.7°F | Ht 68.0 in | Wt 206.0 lb

## 2019-02-22 DIAGNOSIS — I483 Typical atrial flutter: Secondary | ICD-10-CM | POA: Insufficient documentation

## 2019-02-22 DIAGNOSIS — Z13220 Encounter for screening for lipoid disorders: Secondary | ICD-10-CM | POA: Diagnosis not present

## 2019-02-22 DIAGNOSIS — I10 Essential (primary) hypertension: Secondary | ICD-10-CM | POA: Diagnosis not present

## 2019-02-22 MED ORDER — METOPROLOL TARTRATE 25 MG PO TABS: 25.0000 mg | ORAL_TABLET | Freq: Two times a day (BID) | ORAL | 3 refills | Status: DC

## 2019-02-22 MED ORDER — LISINOPRIL 10 MG PO TABS: 10.0000 mg | ORAL_TABLET | Freq: Every day | ORAL | 3 refills | Status: DC

## 2019-02-23 NOTE — Telephone Encounter (Signed)
LM for Kevin Branch at [facility] that the appt is stat

## 2019-02-28 ENCOUNTER — Other Ambulatory Visit (HOSPITAL_COMMUNITY)
Admission: RE | Admit: 2019-02-28 | Discharge: 2019-02-28 | Disposition: A | Discharge status: Home / Self Care | Payer: 59 | Source: Ambulatory Visit | Attending: Internal Medicine | Admitting: Internal Medicine

## 2019-02-28 ENCOUNTER — Telehealth (INDEPENDENT_AMBULATORY_CARE_PROVIDER_SITE_OTHER): Payer: 59 | Admitting: Internal Medicine

## 2019-02-28 ENCOUNTER — Other Ambulatory Visit: Payer: Self-pay

## 2019-02-28 ENCOUNTER — Encounter: Payer: Self-pay | Admitting: Internal Medicine

## 2019-02-28 VITALS — BP 136/86 | HR 87 | Ht 68.0 in | Wt 196.0 lb

## 2019-02-28 DIAGNOSIS — Z01812 Encounter for preprocedural laboratory examination: Secondary | ICD-10-CM | POA: Insufficient documentation

## 2019-02-28 DIAGNOSIS — Z20822 Contact with and (suspected) exposure to covid-19: Secondary | ICD-10-CM | POA: Insufficient documentation

## 2019-02-28 DIAGNOSIS — I483 Typical atrial flutter: Secondary | ICD-10-CM

## 2019-02-28 DIAGNOSIS — I10 Essential (primary) hypertension: Secondary | ICD-10-CM | POA: Diagnosis not present

## 2019-02-28 DIAGNOSIS — E663 Overweight: Secondary | ICD-10-CM | POA: Diagnosis not present

## 2019-02-28 LAB — SARS CORONAVIRUS 2 (TAT 6-24 HRS): SARS Coronavirus 2: NEGATIVE
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
## 2019-03-01 ENCOUNTER — Ambulatory Visit: Payer: 59

## 2019-03-01 ENCOUNTER — Other Ambulatory Visit: Payer: Self-pay

## 2019-03-01 DIAGNOSIS — I483 Typical atrial flutter: Secondary | ICD-10-CM

## 2019-03-02 NOTE — Progress Notes (Signed)
Attempted to call patient regarding instructions for tomorrows procedure.  No answer left voice mail for Instructions for the following items:  Arrival time 1200 Nothing to eat or drink after midnight No meds AM of procedure Responsible person to drive you home and stay with you for 24 hrs  Be sure take Eliquis doses today.

## 2019-03-03 ENCOUNTER — Ambulatory Visit (HOSPITAL_COMMUNITY): Payer: 59 | Admitting: Anesthesiology

## 2019-03-03 ENCOUNTER — Encounter (HOSPITAL_COMMUNITY)
Admission: RE | Disposition: A | Discharge status: Home / Self Care | Payer: Self-pay | Source: Home / Self Care | Attending: Internal Medicine

## 2019-03-03 ENCOUNTER — Ambulatory Visit (HOSPITAL_COMMUNITY)
Admission: RE | Admit: 2019-03-03 | Discharge: 2019-03-03 | Disposition: A | Discharge status: Home / Self Care | Payer: 59 | Attending: Internal Medicine | Admitting: Internal Medicine

## 2019-03-03 ENCOUNTER — Other Ambulatory Visit: Payer: Self-pay

## 2019-03-03 DIAGNOSIS — Z8249 Family history of ischemic heart disease and other diseases of the circulatory system: Secondary | ICD-10-CM | POA: Diagnosis not present

## 2019-03-03 DIAGNOSIS — F17290 Nicotine dependence, other tobacco product, uncomplicated: Secondary | ICD-10-CM | POA: Insufficient documentation

## 2019-03-03 DIAGNOSIS — Z6829 Body mass index (BMI) 29.0-29.9, adult: Secondary | ICD-10-CM | POA: Insufficient documentation

## 2019-03-03 DIAGNOSIS — Z79899 Other long term (current) drug therapy: Secondary | ICD-10-CM | POA: Diagnosis not present

## 2019-03-03 DIAGNOSIS — Z7901 Long term (current) use of anticoagulants: Secondary | ICD-10-CM | POA: Diagnosis not present

## 2019-03-03 DIAGNOSIS — I483 Typical atrial flutter: Secondary | ICD-10-CM | POA: Diagnosis not present

## 2019-03-03 DIAGNOSIS — E785 Hyperlipidemia, unspecified: Secondary | ICD-10-CM | POA: Diagnosis not present

## 2019-03-03 DIAGNOSIS — I10 Essential (primary) hypertension: Secondary | ICD-10-CM | POA: Diagnosis not present

## 2019-03-03 DIAGNOSIS — E663 Overweight: Secondary | ICD-10-CM | POA: Diagnosis not present

## 2019-03-03 DIAGNOSIS — I4892 Unspecified atrial flutter: Secondary | ICD-10-CM

## 2019-03-03 HISTORY — PX: A-FLUTTER ABLATION: EP1230

## 2019-03-03 SURGERY — A-FLUTTER ABLATION
Anesthesia: General

## 2019-03-03 MED ORDER — SODIUM CHLORIDE 0.9% FLUSH: 3.0000 mL | Freq: Two times a day (BID) | INTRAVENOUS | Status: DC

## 2019-03-03 MED ORDER — FENTANYL CITRATE (PF) 250 MCG/5ML IJ SOLN
INTRAMUSCULAR | Status: DC | PRN
  Administered 2019-03-03: 50 ug via INTRAVENOUS
  Administered 2019-03-03: 100 ug via INTRAVENOUS
  Administered 2019-03-03: 50 ug via INTRAVENOUS

## 2019-03-03 MED ORDER — PHENYLEPHRINE HCL-NACL 10-0.9 MG/250ML-% IV SOLN
INTRAVENOUS | Status: DC | PRN
  Administered 2019-03-03: 20 ug/min via INTRAVENOUS

## 2019-03-03 MED ORDER — SODIUM CHLORIDE 0.9 % IV SOLN: INTRAVENOUS | Status: DC | PRN

## 2019-03-03 MED ORDER — DEXAMETHASONE SODIUM PHOSPHATE 10 MG/ML IJ SOLN
INTRAMUSCULAR | Status: DC | PRN
  Administered 2019-03-03: 4 mg via INTRAVENOUS

## 2019-03-03 MED ORDER — SODIUM CHLORIDE 0.9% FLUSH: 3.0000 mL | INTRAVENOUS | Status: DC | PRN

## 2019-03-03 MED ORDER — LIDOCAINE 2% (20 MG/ML) 5 ML SYRINGE
INTRAMUSCULAR | Status: DC | PRN
  Administered 2019-03-03: 60 mg via INTRAVENOUS

## 2019-03-03 MED ORDER — PROPOFOL 500 MG/50ML IV EMUL
INTRAVENOUS | Status: DC | PRN
  Administered 2019-03-03: 150 ug/kg/min via INTRAVENOUS

## 2019-03-03 MED ORDER — ONDANSETRON HCL 4 MG/2ML IJ SOLN: 4.0000 mg | Freq: Four times a day (QID) | INTRAMUSCULAR | Status: DC | PRN

## 2019-03-03 MED ORDER — SODIUM CHLORIDE 0.9 % IV SOLN: 250.0000 mL | INTRAVENOUS | Status: DC | PRN

## 2019-03-03 MED ORDER — ACETAMINOPHEN 325 MG PO TABS
650.0000 mg | ORAL_TABLET | ORAL | Status: DC | PRN
  Filled 2019-03-03: qty 2

## 2019-03-03 MED ORDER — ONDANSETRON HCL 4 MG/2ML IJ SOLN
INTRAMUSCULAR | Status: DC | PRN
  Administered 2019-03-03: 4 mg via INTRAVENOUS

## 2019-03-03 MED ORDER — MIDAZOLAM HCL 2 MG/2ML IJ SOLN
INTRAMUSCULAR | Status: DC | PRN
  Administered 2019-03-03 (×2): 2 mg via INTRAVENOUS

## 2019-03-03 MED ORDER — SODIUM CHLORIDE 0.9 % IV SOLN: INTRAVENOUS | Status: DC

## 2019-03-03 MED ORDER — PROPOFOL 10 MG/ML IV BOLUS
INTRAVENOUS | Status: DC | PRN
  Administered 2019-03-03: 100 mg via INTRAVENOUS
  Administered 2019-03-03 (×2): 50 mg via INTRAVENOUS

## 2019-03-03 MED ORDER — HYDROCODONE-ACETAMINOPHEN 5-325 MG PO TABS
1.0000 | ORAL_TABLET | ORAL | Status: DC | PRN
  Administered 2019-03-03: 1 via ORAL

## 2019-03-03 MED ORDER — HYDROCODONE-ACETAMINOPHEN 5-325 MG PO TABS
ORAL_TABLET | ORAL | Status: AC
  Filled 2019-03-03: qty 1

## 2019-03-03 SURGICAL SUPPLY — 13 items
BAG SNAP BAND KOVER 36X36 (MISCELLANEOUS) ×2 IMPLANT
CATH EZ STEER NAV 8MM F-J CUR (ABLATOR) ×2 IMPLANT
CATH WEBSTER BI DIR CS D-F CRV (CATHETERS) ×2 IMPLANT
DEVICE CLOSURE PERCLS PRGLD 6F (VASCULAR PRODUCTS) ×2 IMPLANT
PACK EP LATEX FREE (CUSTOM PROCEDURE TRAY) ×1
PACK EP LF (CUSTOM PROCEDURE TRAY) ×1 IMPLANT
PAD PRO RADIOLUCENT 2001M-C (PAD) ×2 IMPLANT
PATCH CARTO3 (PAD) ×2 IMPLANT
PERCLOSE PROGLIDE 6F (VASCULAR PRODUCTS) ×4
SHEATH PINNACLE 7F 10CM (SHEATH) ×2 IMPLANT
SHEATH PINNACLE 8F 10CM (SHEATH) ×2 IMPLANT
SHEATH SWARTZ RAMP 8.5F 60CM (SHEATH) ×2 IMPLANT
SHIELD RADPAD SCOOP 12X17 (MISCELLANEOUS) ×2 IMPLANT

## 2019-03-03 NOTE — Interval H&P Note (Signed)
History and Physical Interval Note:  03/03/2019 8:51 AM  Kevin Branch  has presented today for surgery, with the diagnosis of Atrial flutter.  The various methods of treatment have been discussed with the patient and family. After consideration of risks, benefits and other options for treatment, the patient has consented to  Procedure(s): A-FLUTTER ABLATION (N/A) as a surgical intervention.  The patient's history has been reviewed, patient examined, no change in status, stable for surgery.  I have reviewed the patient's chart and labs.  Questions were answered to the patient's satisfaction.    He reports compliance with eliquis without interruption.  Hillis Range

## 2019-03-03 NOTE — Anesthesia Preprocedure Evaluation (Addendum)
Anesthesia Evaluation  Patient identified by MRN, date of birth, ID band Patient awake    Reviewed: Allergy & Precautions, NPO status , Patient's Chart, lab work & pertinent test results  Airway Mallampati: II  TM Distance: >3 FB Neck ROM: Full    Dental no notable dental hx.    Pulmonary neg pulmonary ROS, Current Smoker and Patient abstained from smoking.,    Pulmonary exam normal breath sounds clear to auscultation       Cardiovascular hypertension, Pt. on medications Normal cardiovascular exam+ dysrhythmias Atrial Fibrillation  Rhythm:Regular Rate:Normal     Neuro/Psych negative neurological ROS  negative psych ROS   GI/Hepatic negative GI ROS, Neg liver ROS,   Endo/Other  diabetes  Renal/GU negative Renal ROS  negative genitourinary   Musculoskeletal negative musculoskeletal ROS (+)   Abdominal   Peds negative pediatric ROS (+)  Hematology negative hematology ROS (+)   Anesthesia Other Findings   Reproductive/Obstetrics negative OB ROS                             Anesthesia Physical Anesthesia Plan  ASA: III  Anesthesia Plan: MAC   Post-op Pain Management:    Induction: Intravenous  PONV Risk Score and Plan: 1 and Ondansetron and Treatment may vary due to age or medical condition  Airway Management Planned: Simple Face Mask  Additional Equipment:   Intra-op Plan:   Post-operative Plan:   Informed Consent: I have reviewed the patients History and Physical, chart, labs and discussed the procedure including the risks, benefits and alternatives for the proposed anesthesia with the patient or authorized representative who has indicated his/her understanding and acceptance.     Dental advisory given  Plan Discussed with: CRNA  Anesthesia Plan Comments:        Anesthesia Quick Evaluation

## 2019-03-03 NOTE — Anesthesia Postprocedure Evaluation (Signed)
Anesthesia Post Note  Patient: Kevin Branch  Procedure(s) Performed: A-FLUTTER ABLATION (N/A )     Patient location during evaluation: PACU Anesthesia Type: General Level of consciousness: awake and alert Pain management: pain level controlled Vital Signs Assessment: post-procedure vital signs reviewed and stable Respiratory status: spontaneous breathing, nonlabored ventilation and respiratory function stable Cardiovascular status: blood pressure returned to baseline and stable Postop Assessment: no apparent nausea or vomiting Anesthetic complications: no    Last Vitals:  Vitals:   03/03/19 1152 03/03/19 1203  BP:  136/62  Pulse:  79  Resp:  10  Temp: 36.9 C   SpO2:  93%    Last Pain:  Vitals:   03/03/19 1204  TempSrc:   PainSc: 5                  Lowella Curb

## 2019-03-03 NOTE — Transfer of Care (Signed)
Immediate Anesthesia Transfer of Care Note  Patient: Kevin Branch  Procedure(s) Performed: A-FLUTTER ABLATION (N/A )  Patient Location: PACU and Cath Lab  Anesthesia Type:General  Level of Consciousness: awake, oriented and patient cooperative  Airway & Oxygen Therapy: Patient Spontanous Breathing and Patient connected to nasal cannula oxygen  Post-op Assessment: Report given to RN and Post -op Vital signs reviewed and stable  Post vital signs: Reviewed and stable  Last Vitals:  Vitals Value Taken Time  BP 132/60 03/03/19 1105  Temp    Pulse 80 03/03/19 1108  Resp 15 03/03/19 1108  SpO2 96 % 03/03/19 1108  Vitals shown include unvalidated device data.  Last Pain:  Vitals:   03/03/19 0808  TempSrc:   PainSc: 0-No pain      Patients Stated Pain Goal: 3 (03/03/19 9295)  Complications: No apparent anesthesia complications

## 2019-03-03 NOTE — Anesthesia Procedure Notes (Signed)
Procedure Name: LMA Insertion Date/Time: 03/03/2019 9:22 AM Performed by: Adria Dill, CRNA Pre-anesthesia Checklist: Patient identified, Emergency Drugs available, Suction available and Patient being monitored Patient Re-evaluated:Patient Re-evaluated prior to induction Oxygen Delivery Method: Circle system utilized Preoxygenation: Pre-oxygenation with 100% oxygen Induction Type: IV induction LMA: LMA inserted LMA Size: 4.0 Number of attempts: 1 Placement Confirmation: positive ETCO2 and breath sounds checked- equal and bilateral Tube secured with: Tape Dental Injury: Teeth and Oropharynx as per pre-operative assessment

## 2019-03-03 NOTE — Discharge Instructions (Signed)
Post procedure care instructions No driving for 4 days. No lifting over 5 lbs for 1 week. No vigorous or sexual activity for 1 week. You may return to work/your usual activities on 03/10/2019. Keep procedure site clean & dry. If you notice increased pain, swelling, bleeding or pus, call/return!  You may shower, but no soaking baths/hot tubs/pools for 1 week.     Cardiac Ablation, Care After  This sheet gives you information about how to care for yourself after your procedure. Your health care provider may also give you more specific instructions. If you have problems or questions, contact your health care provider. What can I expect after the procedure? After the procedure, it is common to have:  Bruising around your puncture site.  Tenderness around your puncture site.  Skipped heartbeats.  Tiredness (fatigue).  Follow these instructions at home: Puncture site care   Follow instructions from your health care provider about how to take care of your puncture site. Make sure you: ? If present, leave stitches (sutures), skin glue, or adhesive strips in place. These skin closures may need to stay in place for up to 2 weeks. If adhesive strip edges start to loosen and curl up, you may trim the loose edges. Do not remove adhesive strips completely unless your health care provider tells you to do that.  Check your puncture site every day for signs of infection. Check for: ? Redness, swelling, or pain. ? Fluid or blood. If your puncture site starts to bleed, lie down on your back, apply firm pressure to the area, and contact your health care provider. ? Warmth. ? Pus or a bad smell. Driving  Do not drive for at least 4 days after your procedure or however long your health care provider recommends. (Do not resume driving if you have previously been instructed not to drive for other health reasons.)  Do not drive or use heavy machinery while taking prescription pain  medicine. Activity  Avoid activities that take a lot of effort for at least 7 days after your procedure.  Do not lift anything that is heavier than 5 lb (4.5 kg) for one week.   No sexual activity for 1 week.   Return to your normal activities as told by your health care provider. Ask your health care provider what activities are safe for you. General instructions  Take over-the-counter and prescription medicines only as told by your health care provider.  Do not use any products that contain nicotine or tobacco, such as cigarettes and e-cigarettes. If you need help quitting, ask your health care provider.  You may shower after 24 hours, but Do not take baths, swim, or use a hot tub for 1 week.   Do not drink alcohol for 24 hours after your procedure.  Keep all follow-up visits as told by your health care provider. This is important. Contact a health care provider if:  You have redness, mild swelling, or pain around your puncture site.  You have fluid or blood coming from your puncture site that stops after applying firm pressure to the area.  Your puncture site feels warm to the touch.  You have pus or a bad smell coming from your puncture site.  You have a fever.  You have chest pain or discomfort that spreads to your neck, jaw, or arm.  You are sweating a lot.  You feel nauseous.  You have a fast or irregular heartbeat.  You have shortness of breath.  You are dizzy   or light-headed and feel the need to lie down.  You have pain or numbness in the arm or leg closest to your puncture site. Get help right away if:   Your puncture site suddenly swells.  Your puncture site is bleeding and the bleeding does not stop after applying firm pressure to the area. These symptoms may represent a serious problem that is an emergency. Do not wait to see if the symptoms will go away. Get medical help right away. Call your local emergency services (911 in the [place] Do not drive  yourself to the hospital. Summary  After the procedure, it is normal to have bruising and tenderness at the puncture site in your groin, neck, or forearm.  Check your puncture site every day for signs of infection.  Get help right away if your puncture site is bleeding and the bleeding does not stop after applying firm pressure to the area. This is a medical emergency. This information is not intended to replace advice given to you by your health care provider. Make sure you discuss any questions you have with your health care provider.   

## 2019-03-03 NOTE — Progress Notes (Signed)
Up and walked and tolerated well; right groin stable, no bleeding or hematoma 

## 2019-03-23 ENCOUNTER — Telehealth: Payer: Self-pay | Admitting: Internal Medicine

## 2019-03-23 NOTE — Telephone Encounter (Signed)
Patient calling the office for samples of medication:   1.  What medication and dosage are you requesting samples for?  apixaban (ELIQUIS) 5 MG TABS tablet(Expired)   2.  Are you currently out of this medication? No, runs out Sunday.

## 2019-03-24 MED ORDER — APIXABAN 5 MG PO TABS: 5.0000 mg | ORAL_TABLET | Freq: Two times a day (BID) | ORAL | 1 refills | Status: DC

## 2019-03-24 NOTE — Telephone Encounter (Signed)
Called patient. Advised I would send in Rx for 30 day supply of Eliquis. Can use Copay card to get cost to $10. Patient under the impression that he can stop anticoagulation next week. He underwent ablation about a month ago. Typically on anticoagulation for 90 days post ablation. I will clarify with Dr. Johney Frame if/when he can stop anticoagulation.

## 2019-03-27 ENCOUNTER — Telehealth: Payer: Self-pay

## 2019-03-29 ENCOUNTER — Telehealth (INDEPENDENT_AMBULATORY_CARE_PROVIDER_SITE_OTHER): Payer: 59 | Admitting: Internal Medicine

## 2019-03-29 ENCOUNTER — Encounter: Payer: Self-pay | Admitting: Internal Medicine

## 2019-03-29 ENCOUNTER — Other Ambulatory Visit: Payer: Self-pay

## 2019-03-29 VITALS — BP 136/86 | HR 88 | Ht 68.0 in | Wt 200.0 lb

## 2019-03-29 DIAGNOSIS — E663 Overweight: Secondary | ICD-10-CM

## 2019-03-29 DIAGNOSIS — I10 Essential (primary) hypertension: Secondary | ICD-10-CM | POA: Diagnosis not present

## 2019-03-29 DIAGNOSIS — I483 Typical atrial flutter: Secondary | ICD-10-CM | POA: Diagnosis not present

## 2019-03-29 DIAGNOSIS — Z6830 Body mass index (BMI) 30.0-30.9, adult: Secondary | ICD-10-CM | POA: Diagnosis not present

## 2019-03-29 NOTE — Telephone Encounter (Signed)
For his particular arrhythmia its just 30 days.  I discussed with him today.

## 2019-03-29 NOTE — Progress Notes (Signed)
Electrophysiology TeleHealth Note   Due to national recommendations of social distancing due to COVID 19, an audio/video telehealth visit is felt to be most appropriate for this patient at this time.  See MyChart message from today for the patient's consent to telehealth for [facility].  Date:  03/29/2019   ID:  Leonia Reader, DOB 1970/09/18, MRN [id number]  Location: patient's home  Provider location:  [facility]  Evaluation Performed: Follow-up visit  PCP:  [name], NP   Electrophysiologist:  Dr [name]  Chief Complaint:  palpitations  History of Present Illness:    DJANGO NGUYEN is a 49 y.o. male who presents via telehealth conferencing today.  Since last being seen in our clinic, the patient reports doing very well.  Today, he denies symptoms of palpitations, chest pain, shortness of breath,  lower extremity edema, dizziness, presyncope, or syncope.  The patient is otherwise without complaint today.  The patient denies symptoms of fevers, chills, cough, or new SOB worrisome for COVID 19.  Past Medical History:  Diagnosis Date  . Essential hypertension   . Hyperglycemia   . Hyperlipidemia   . Overweight   . Typical atrial flutter [facility])     Past Surgical History:  Procedure Laterality Date  . A-FLUTTER ABLATION N/A 03/03/2019   Procedure: A-FLUTTER ABLATION;  Surgeon: Thompson Grayer, MD;  Location: [facility];  Service: Cardiovascular;  Laterality: N/A;  . none      Current Outpatient Medications  Medication Sig Dispense Refill  . apixaban (ELIQUIS) 5 MG TABS tablet Take 1 tablet (5 mg total) by mouth 2 (two) times daily. 60 tablet 1  . ascorbic acid (VITAMIN C) 500 MG tablet Take 500 mg by mouth daily.    . calcium carbonate (TUMS - DOSED IN MG ELEMENTAL CALCIUM) 500 MG chewable tablet Chew 2 tablets by mouth daily as needed for indigestion or heartburn.    . diltiazem (CARDIZEM CD) 120 MG 24 hr capsule Take 1 capsule (120 mg total) by mouth  daily. 30 capsule 0  . diltiazem (CARDIZEM) 30 MG tablet Take 1 tablet (30 mg total) by mouth 2 (two) times daily as needed (for palpitations with heart rate > 110 despite taking the extended release medicine). 20 tablet 0  . ibuprofen (ADVIL) 200 MG tablet Take 400 mg by mouth every 6 (six) hours as needed for headache or moderate pain.    Marland Kitchen lisinopril (ZESTRIL) 10 MG tablet Take 1 tablet (10 mg total) by mouth daily. 30 tablet 3  . metoprolol tartrate (LOPRESSOR) 25 MG tablet Take 1 tablet (25 mg total) by mouth 2 (two) times daily. 60 tablet 3  . Multiple Vitamins-Minerals (ONE-A-DAY VITACRAVES ADULT PO) Take 1 tablet by mouth daily.     No current facility-administered medications for this visit.    Allergies:   Patient has no known allergies.   Social History:  The patient  reports that he has been smoking cigars. He has never used smokeless tobacco. He reports current alcohol use. He reports that he does not use drugs.   ROS:  Please see the history of present illness.   All other systems are personally reviewed and negative.    Exam:    Vital Signs:  BP 136/86   Pulse 88   Ht 5\' 8"  (1.727 m)   Wt 200 lb (90.7 kg)   BMI 30.41 kg/m   Well sounding and appearing, alert and conversant, regular work of breathing,  good  skin color Eyes- anicteric, neuro- grossly intact, skin- no apparent rash or lesions or cyanosis, mouth- oral mucosa is pink  Labs/Other Tests and Data Reviewed:    Recent Labs: 02/10/2019: BUN 12; Creatinine, Ser 0.47; Hemoglobin 13.7; Magnesium 1.9; Platelets 171; Potassium 3.6; Sodium 137   Wt Readings from Last 3 Encounters:  03/29/19 200 lb (90.7 kg)  03/03/19 196 lb (88.9 kg)  02/28/19 196 lb (88.9 kg)       ASSESSMENT & PLAN:    1. Typical atrial flutter Resolved post ablation Stop anticoagulation Reduce metoprolol to 12.5mg  BID x 2 weeks then discontinue metoprolol Stop diltiazem in 4 weeks  2. HTN Stable No change required today  3.  Overweight Lifestyle modification is advised  We discussed that patents with atrial flutter will sometimes have afib subsequently.  We discussed the importance of lifestyle modification in order to prevent this from happening.  Ok to return to full work related duties  Follow-up:  As needed   Patient Risk:  after full review of this patients clinical status, I feel that they are at moderate risk at this time.  Today, I have spent 15 minutes with the patient with telehealth technology discussing arrhythmia management .    Randolm Idol, MD  03/29/2019 3:22 PM     [facility] [address] [place] [address] [phone] (office) [phone] (fax)

## 2019-09-25 ENCOUNTER — Encounter: Payer: Self-pay | Admitting: Physician Assistant

## 2019-10-26 ENCOUNTER — Ambulatory Visit: Payer: 59 | Admitting: Physician Assistant

## 2020-01-26 ENCOUNTER — Other Ambulatory Visit: Payer: Self-pay | Admitting: Nurse Practitioner

## 2020-01-26 ENCOUNTER — Other Ambulatory Visit: Payer: Self-pay

## 2020-01-26 ENCOUNTER — Ambulatory Visit
Admission: RE | Admit: 2020-01-26 | Discharge: 2020-01-26 | Disposition: A | Discharge status: Home / Self Care | Payer: No Typology Code available for payment source | Source: Ambulatory Visit | Attending: Nurse Practitioner | Admitting: Nurse Practitioner

## 2020-01-26 DIAGNOSIS — Z0289 Encounter for other administrative examinations: Secondary | ICD-10-CM

## 2020-03-05 ENCOUNTER — Encounter (HOSPITAL_COMMUNITY): Payer: Self-pay

## 2020-03-05 ENCOUNTER — Emergency Department (HOSPITAL_COMMUNITY): Payer: 59

## 2020-03-05 ENCOUNTER — Emergency Department (HOSPITAL_COMMUNITY)
Admission: EM | Admit: 2020-03-05 | Discharge: 2020-03-05 | Disposition: A | Discharge status: Home / Self Care | Payer: 59 | Attending: Emergency Medicine | Admitting: Emergency Medicine

## 2020-03-05 DIAGNOSIS — Z7982 Long term (current) use of aspirin: Secondary | ICD-10-CM | POA: Diagnosis not present

## 2020-03-05 DIAGNOSIS — R002 Palpitations: Secondary | ICD-10-CM | POA: Diagnosis present

## 2020-03-05 DIAGNOSIS — I4891 Unspecified atrial fibrillation: Secondary | ICD-10-CM | POA: Insufficient documentation

## 2020-03-05 DIAGNOSIS — F17290 Nicotine dependence, other tobacco product, uncomplicated: Secondary | ICD-10-CM | POA: Insufficient documentation

## 2020-03-05 DIAGNOSIS — Z79899 Other long term (current) drug therapy: Secondary | ICD-10-CM | POA: Diagnosis not present

## 2020-03-05 DIAGNOSIS — I4819 Other persistent atrial fibrillation: Secondary | ICD-10-CM | POA: Diagnosis not present

## 2020-03-05 DIAGNOSIS — Z7901 Long term (current) use of anticoagulants: Secondary | ICD-10-CM | POA: Diagnosis not present

## 2020-03-05 DIAGNOSIS — E119 Type 2 diabetes mellitus without complications: Secondary | ICD-10-CM | POA: Diagnosis not present

## 2020-03-05 DIAGNOSIS — I10 Essential (primary) hypertension: Secondary | ICD-10-CM | POA: Insufficient documentation

## 2020-03-05 DIAGNOSIS — R21 Rash and other nonspecific skin eruption: Secondary | ICD-10-CM | POA: Diagnosis not present

## 2020-03-05 LAB — BASIC METABOLIC PANEL
Anion gap: 11 (ref 5–15)
BUN: 14 mg/dL (ref 6–20)
CO2: 24 mmol/L (ref 22–32)
Calcium: 8.7 mg/dL — ABNORMAL LOW (ref 8.9–10.3)
Chloride: 101 mmol/L (ref 98–111)
Creatinine, Ser: 0.62 mg/dL (ref 0.61–1.24)
GFR, Estimated: >60 mL/min (ref >60)
Glucose, Bld: 143 mg/dL — ABNORMAL HIGH (ref 70–99)
Potassium: 4.1 mmol/L (ref 3.5–5.1)
Sodium: 136 mmol/L (ref 135–145)

## 2020-03-05 LAB — CBC
HCT: 40.7 % (ref 39.0–52.0)
Hemoglobin: 13.5 g/dL (ref 13.0–17.0)
MCH: 31.5 pg (ref 26.0–34.0)
MCHC: 33.2 g/dL (ref 30.0–36.0)
MCV: 94.9 fL (ref 80.0–100.0)
Platelets: 204 10*3/uL (ref 150–400)
RBC: 4.29 MIL/uL (ref 4.22–5.81)
RDW: 15 % (ref 11.5–15.5)
WBC: 9.3 10*3/uL (ref 4.0–10.5)
nRBC: 0 % (ref 0.0–0.2)

## 2020-03-05 MED ORDER — METOPROLOL TARTRATE 5 MG/5ML IV SOLN: 5.0000 mg | Freq: Once | INTRAVENOUS | Status: DC

## 2020-03-05 MED ORDER — APIXABAN 5 MG PO TABS: 5.0000 mg | ORAL_TABLET | Freq: Two times a day (BID) | ORAL | 1 refills | Status: DC

## 2020-03-05 MED ORDER — DILTIAZEM HCL-DEXTROSE 125-5 MG/125ML-% IV SOLN (PREMIX)
5.0000 mg/h | INTRAVENOUS | Status: DC
  Administered 2020-03-05: 5 mg/h via INTRAVENOUS
  Filled 2020-03-05: qty 125

## 2020-03-05 MED ORDER — APIXABAN 5 MG PO TABS
5.0000 mg | ORAL_TABLET | Freq: Two times a day (BID) | ORAL | Status: DC
  Administered 2020-03-05: 5 mg via ORAL
  Filled 2020-03-05: qty 1

## 2020-03-05 MED ORDER — DILTIAZEM HCL ER COATED BEADS 180 MG PO CP24
180.0000 mg | ORAL_CAPSULE | Freq: Every day | ORAL | Status: DC
  Administered 2020-03-05: 180 mg via ORAL
  Filled 2020-03-05: qty 1

## 2020-03-05 MED ORDER — SODIUM CHLORIDE 0.9 % IV BOLUS
1000.0000 mL | Freq: Once | INTRAVENOUS | Status: AC
  Administered 2020-03-05: 1000 mL via INTRAVENOUS

## 2020-03-05 MED ORDER — DILTIAZEM HCL 30 MG PO TABS: 30.0000 mg | ORAL_TABLET | Freq: Three times a day (TID) | ORAL | 3 refills | Status: DC | PRN

## 2020-03-05 MED ORDER — DILTIAZEM HCL ER COATED BEADS 180 MG PO CP24: 180.0000 mg | ORAL_CAPSULE | Freq: Every day | ORAL | 6 refills | Status: DC

## 2020-03-05 NOTE — Consult Note (Addendum)
ELECTROPHYSIOLOGY CONSULT NOTE    Patient ID: Kevin Branch MRN: [id number], DOB/AGE: 51-Jul-1972 51 y.o.  Admit date: 03/05/2020 Date of Consult: 03/05/2020  Primary Physician: Doreene Nest, NP Primary Cardiologist: No primary care provider on file.  Electrophysiologist: Dr. Johney Frame  Referring Provider: Dr. Pilar Plate  Patient Profile: Kevin Branch is a 50 y.o. male with a history of COVID-19, atrial flutter s/p ablation in 2/21, DM2, HLD, HTN who is being seen today for the evaluation of AF with RVR at the request of Dr. Pilar Plate.  HPI:  Kevin Branch is a 50 y.o. male with medical history as above who presented to ED with palpitations x 2 days. Given history of atrial flutter and monitoring with his watch, he knew to seek care. He states it was tolerable up until yesterday evening when his HR got up to 190 and he vomited. Otherwise, he denies chest pain, but has had SOB and fatigue.   He has had gastrointestinal upset that has gradually improved over the past several weeks after a COVID infection. He also had a non-specific rash that he thought may have been related to gluten. He changed his diet and that has also improved.   He denies fever, chills, back pain, neck pain, visual changes, dysuria, leg swelling, or orthopnea.  He did try "bearing down" at home without improvement of his palpitations.  He is NOT anticoagulated.   He feels OK currently. HRs have improved to 100-110s on IV diltiazem. Pt has had palpitations since at least Saturday. He has had 4-6 breakthrough episodes of palpitations since his ablation last year. This is the longest, and the worst he has felt.   He retired from his job recently at the [employer]. He had COVID in January of this year. He had nausea, vomiting, diarrhea for a couple of weeks after this, as well as a rash on his legs. He felt like this improved as he began to cut out gluten, as he has a family history of gluten sensitivity.    Past Medical  History:  Diagnosis Date  . Essential hypertension   . Hyperglycemia   . Hyperlipidemia   . Overweight   . Typical atrial flutter [facility])      Surgical History:  Past Surgical History:  Procedure Laterality Date  . A-FLUTTER ABLATION N/A 03/03/2019   Procedure: A-FLUTTER ABLATION;  Surgeon: Hillis Range, MD;  Location: [facility];  Service: Cardiovascular;  Laterality: N/A;  . none       (Not in a hospital admission)   Inpatient Medications:   Allergies: No Known Allergies  Social History   Socioeconomic History  . Marital status: Married    Spouse name: Not on file  . Number of children: 6  . Years of education: Not on file  . Highest education level: Not on file  Occupational History  . Not on file  Tobacco Use  . Smoking status: Current Some Day Smoker    Types: Cigars  . Smokeless tobacco: Never Used  Vaping Use  . Vaping Use: Never used  Substance and Sexual Activity  . Alcohol use: Yes    Comment: 1-2 beers per day when off duty  . Drug use: Never  . Sexual activity: Not on file  Other Topics Concern  . Not on file  Social History Narrative   Married.   6 children.   Works for the [employer].   Lives in [place].   Enjoys hunting and  fishing.    Social Determinants of Health   Financial Resource Strain: Not on file  Food Insecurity: Not on file  Transportation Needs: Not on file  Physical Activity: Not on file  Stress: Not on file  Social Connections: Not on file  Intimate Partner Violence: Not on file     Family History  Problem Relation Age of Onset  . Hyperlipidemia Mother   . Heart disease Mother   . Diabetes Mother   . Hyperlipidemia Father   . Hypertension Sister   . Hyperlipidemia Sister      Review of Systems: All other systems reviewed and are otherwise negative except as noted above.  Physical Exam: Vitals:   03/05/20 0958 03/05/20 0959 03/05/20 1000 03/05/20 1015  BP:    125/70  Pulse: (!) 115  (!) 117 (!) 115 (!) 112  Resp:    18  Temp:      TempSrc:      SpO2: 98% 99% 97% 97%    GEN- The patient is well appearing, alert and oriented x 3 today.   HEENT: normocephalic, atraumatic; sclera clear, conjunctiva pink; hearing intact; oropharynx clear; neck supple Lungs- Clear to ausculation bilaterally, normal work of breathing.  No wheezes, rales, rhonchi Heart- Regular rate and rhythm, no murmurs, rubs or gallops GI- soft, non-tender, non-distended, bowel sounds present Extremities- no clubbing, cyanosis, or edema; DP/PT/radial pulses 2+ bilaterally MS- no significant deformity or atrophy Skin- warm and dry, no rash or lesion Psych- euthymic mood, full affect Neuro- strength and sensation are intact  Labs:   Lab Results  Component Value Date   WBC 9.3 03/05/2020   HGB 13.5 03/05/2020   HCT 40.7 03/05/2020   MCV 94.9 03/05/2020   PLT 204 03/05/2020    Recent Labs  Lab 03/05/20 0348  NA 136  K 4.1  CL 101  CO2 24  BUN 14  CREATININE 0.62  CALCIUM 8.7*  GLUCOSE 143*      Radiology/Studies: DG Chest Portable 1 View  Result Date: 03/05/2020 CLINICAL DATA:  50 year old male with chest pain. Intermittent atrial fibrillation for 3 days. High heart rate. Vomited. EXAM: PORTABLE CHEST 1 VIEW COMPARISON:  Chest radiographs 01/26/2020 and earlier. FINDINGS: Portable AP semi upright view at 0356 hours. Lung volumes and mediastinal contours remain within normal limits. Visualized tracheal air column is within normal limits. Lung markings appear stable. Allowing for portable technique the lungs are clear. No pneumothorax or pleural effusion. Negative visible bowel gas and osseous structures. IMPRESSION: Negative portable chest. Electronically Signed   By: Odessa Fleming M.D.   On: 03/05/2020 04:02    EKG: AF with RVR in 150s (personally reviewed)  TELEMETRY: AF with HRs 100-140s (personally reviewed)  Assessment/Plan: 1.  Atrial flutter s/p ablation 02/2019 -> Atrial fib with  RVR Pt presents with >48 hours of palpitations and found to be in AF with RVR.  Rates have improved with IV diltiazem. Symptoms include fatigue and DOE. He did have vomiting last night when his HR was up in the 190s.  Electrolytes OK.  Restart Eliquis 5 mg BID for CHA2DS2VASC of 2 and plans to cardiovert.  Start Diltiazem 180 mg daily.  Will also send him with prn dilt 30 mg tablets.  Close follow up in AF clinic to discuss [facility]. Ideally, he would like to avoid TEE if possible.   2. HTN Stable currently.  He has been off lisinopril. Consider resuming this as he stabilizes.   Will dose oral diltiazem and wean  IV. Plan will be home this afternoon/early evening if rates remain controlled and he continues to feel overall well. Will schedule close AF clinic follow up.   ADDENDUM Dr. Johney Frame has seen the patient. His HR has stabilized off dilt gtt. He is stable for discharge from an EP perspective.   Cardiac meds for home:  Eliquis 5 mg BID Diltiazem ER 180 mg daily Diltiazem IR 30 mg q 8 hrs as needed for HRs > 110   He will follow up very closely in AF clinic 2/25.  He does not have known CAD, so may consider Flecainide once he is appropriate anticoagulated.   For questions or updates, please contact [facility] Please consult [email or web address] for contact info under Cardiology/STEMI.  Signed, Graciella Freer, PA-C  03/05/2020 10:40 AM   I have seen, examined the patient, and reviewed the above assessment and plan.  Changes to above are made where necessary.  On exam, iRRR.   The patient presents with new onset atrial fibrillation but more than 48 hours in duration.  On review of his [place] Knife, he has actually had episodes of afib since December.   Will start [facility] therapy for chads2vasc score of 1.  Rate control with oral diltiazem and discharge with close follow-up in the AF clinic.  Would plan to start flecainide after 3 weeks of anticoagulation with possible subsequent  cardioversion.  Co Sign: Hillis Range, MD 03/05/2020 9:38 PM

## 2020-03-05 NOTE — ED Notes (Signed)
Cards paged @ [phone]-per Asher Muir, RN (Charge) paged by [employer] MD plans for D/C, if Heart Rate remains stable.

## 2020-03-05 NOTE — ED Provider Notes (Addendum)
[facility] Provider Note   CSN: [id number] Arrival date & time: 03/05/20  0330     History Chief Complaint  Patient presents with  . Atrial Fibrillation    Kevin Branch is a 50 y.o. male with a history of COVID-19 (1/20), atrial flutter s/p partial ablation in 2/21, diabetes mellitus, hyperlipidemia, HTN who presents to the emergency department with palpitations.  The patient reports that he has had a rapid heart rate for the last 2 days and suspects that he may be in atrial flutter due to the persistent palpitations and to the notifications on his smart watch.  He reports that the rate has been increasing and decreasing since onset and has been manageable until just prior to arrival his heart rate increased to 190 and he had 1 episode of nonbloody, nonbilious vomiting and dizziness.  The symptoms have since resolved and his heart rate has slightly improved since he was in the ER.  Aside from palpitations, he denies chest pain, but has had some mild shortness of breath and fatigue.  He reports that he was diagnosed with COVID-19 on January 9, just before he retired from the [employer].  He reports that he had diarrhea and poor appetite with COVID-19, but this is improved over the last few weeks.  He does note that he also developed a rash on his bilateral lower legs around the time that is scabbed over and slightly improved since onset.  States that he did some research online and given his family history of celiac disease, he was concerned that he may have diagnosed celiac disease so he has been avoiding gluten.  Reports that he has been feeling much better since changing his diet about a week and a half ago and has had no vomiting or diarrhea since that time until he vomited tonight.  He denies fever, chills, back pain, neck pain, visual changes, dysuria, leg swelling, orthopnea.  Reports that he tried Valsalva maneuvers at home for his symptoms  without improvement.  He is not anticoagulated.  Was previously on Eliquis, but this was discontinued after his ablation.  The history is provided by the patient and medical records. No language interpreter was used.       Past Medical History:  Diagnosis Date  . Essential hypertension   . Hyperglycemia   . Hyperlipidemia   . Overweight   . Typical atrial flutter [facility](HCC)     Patient Active Problem List   Diagnosis Date Noted  . Screening cholesterol level 02/22/2019  . Typical atrial flutter (HCC) 02/22/2019  . Hyperlipidemia 07/03/2016  . Essential hypertension 04/08/2016  . Type 2 diabetes mellitus (HCC) 04/08/2016    Past Surgical History:  Procedure Laterality Date  . A-FLUTTER ABLATION N/A 03/03/2019   Procedure: A-FLUTTER ABLATION;  Surgeon: Hillis RangeAllred, James, MD;  Location: [facility] INVASIVE CV LAB;  Service: Cardiovascular;  Laterality: N/A;  . none         Family History  Problem Relation Age of Onset  . Hyperlipidemia Mother   . Heart disease Mother   . Diabetes Mother   . Hyperlipidemia Father   . Hypertension Sister   . Hyperlipidemia Sister     Social History   Tobacco Use  . Smoking status: Current Some Day Smoker    Types: Cigars  . Smokeless tobacco: Never Used  Vaping Use  . Vaping Use: Never used  Substance Use Topics  . Alcohol use: Yes    Comment: 1-2 beers  per day when off duty  . Drug use: Never    Home Medications Prior to Admission medications   Medication Sig Start Date End Date Taking? Authorizing Provider  ascorbic acid (VITAMIN C) 500 MG tablet Take 500 mg by mouth daily.   Yes [provider]  aspirin EC 81 MG tablet Take 162 mg by mouth every 4 (four) hours as needed for mild pain. Swallow whole.   Yes [provider]  calcium carbonate (TUMS - DOSED IN MG ELEMENTAL CALCIUM) 500 MG chewable tablet Chew 2 tablets by mouth daily as needed for indigestion or heartburn.   Yes [provider]  ibuprofen (ADVIL)  200 MG tablet Take 400 mg by mouth every 6 (six) hours as needed for headache or moderate pain.   Yes [provider]  lisinopril (ZESTRIL) 10 MG tablet Take 1 tablet (10 mg total) by mouth daily. 02/22/19  Yes Patwardhan, Manish J, MD  Multiple Vitamins-Minerals (ONE-A-DAY VITACRAVES ADULT PO) Take 1 tablet by mouth daily.   Yes [provider]  Pseudoephedrine-APAP-DM (DAYQUIL PO) Take 2 capsules by mouth daily as needed (congestion).   Yes [provider]  apixaban (ELIQUIS) 5 MG TABS tablet Take 1 tablet (5 mg total) by mouth 2 (two) times daily. Patient not taking: Reported on 03/05/2020 03/24/19   Hillis Range, MD  diltiazem (CARDIZEM CD) 120 MG 24 hr capsule Take 1 capsule (120 mg total) by mouth daily. Patient not taking: Reported on 03/05/2020 02/10/19   Derwood Kaplan, MD  diltiazem (CARDIZEM) 30 MG tablet Take 1 tablet (30 mg total) by mouth 2 (two) times daily as needed (for palpitations with heart rate > 110 despite taking the extended release medicine). Patient not taking: Reported on 03/05/2020 02/10/19   Derwood Kaplan, MD  metoprolol tartrate (LOPRESSOR) 25 MG tablet Take 1 tablet (25 mg total) by mouth 2 (two) times daily. Patient not taking: Reported on 03/05/2020 02/22/19 05/23/19  Elder Negus, MD    Allergies    Patient has no known allergies.  Review of Systems   Review of Systems  Constitutional: Negative for appetite change and fever.  Respiratory: Negative for cough, shortness of breath and wheezing.   Cardiovascular: Positive for palpitations. Negative for chest pain and leg swelling.  Gastrointestinal: Negative for abdominal pain, blood in stool, constipation, diarrhea, nausea and vomiting.  Genitourinary: Negative for dysuria.  Musculoskeletal: Negative for arthralgias, back pain, gait problem, myalgias, neck pain and neck stiffness.  Skin: Positive for rash. Negative for wound.  Allergic/Immunologic: Negative for immunocompromised  state.  Neurological: Negative for seizures, syncope, weakness, numbness and headaches.  Psychiatric/Behavioral: Negative for confusion.    Physical Exam Updated Vital Signs BP 133/85   Pulse (!) 149   Temp 98.2 F (36.8 C) (Oral)   Resp (!) 24   SpO2 98%   Physical Exam Vitals and nursing note reviewed.  Constitutional:      General: He is not in acute distress.    Appearance: He is well-developed. He is not ill-appearing, toxic-appearing or diaphoretic.     Comments: Well-appearing.  No acute distress.  HENT:     Head: Normocephalic.     Mouth/Throat:     Mouth: Mucous membranes are moist.  Eyes:     Extraocular Movements: Extraocular movements intact.     Conjunctiva/sclera: Conjunctivae normal.     Pupils: Pupils are equal, round, and reactive to light.  Cardiovascular:     Rate and Rhythm: Tachycardia present. Rhythm irregularly irregular.  Pulses:          Radial pulses are 2+ on the right side and 2+ on the left side.       Dorsalis pedis pulses are 2+ on the right side and 2+ on the left side.       Posterior tibial pulses are 2+ on the right side and 2+ on the left side.     Heart sounds: No murmur heard.   Pulmonary:     Effort: Pulmonary effort is normal. No respiratory distress.     Breath sounds: No stridor. No wheezing, rhonchi or rales.  Chest:     Chest wall: No tenderness.  Abdominal:     General: There is no distension.     Palpations: Abdomen is soft. There is no mass.     Tenderness: There is no abdominal tenderness. There is no right CVA tenderness, left CVA tenderness, guarding or rebound.     Hernia: No hernia is present.  Musculoskeletal:     Cervical back: Neck supple.     Right lower leg: No edema.     Left lower leg: No edema.  Skin:    General: Skin is warm and dry.     Comments: Scattered, circular, scabbed rash noted to the bilateral lower legs.  Neurological:     General: No focal deficit present.     Mental Status: He is  alert.  Psychiatric:        Behavior: Behavior normal.     ED Results / Procedures / Treatments   Labs (all labs ordered are listed, but only abnormal results are displayed) Labs Reviewed  BASIC METABOLIC PANEL - Abnormal; Notable for the following components:      Result Value   Glucose, Bld 143 (*)    Calcium 8.7 (*)    All other components within normal limits  CBC    EKG EKG Interpretation  Date/Time:  Tuesday March 05 2020 03:40:30 EST Ventricular Rate:  152 PR Interval:    QRS Duration: 78 QT Interval:  286 QTC Calculation: 454 R Axis:   17 Text Interpretation: Atrial fibrillation with rapid ventricular response Nonspecific T wave abnormality Abnormal ECG Confirmed by Kennis Carina [phone]) on 03/05/2020 7:13:30 AM     Radiology DG Chest Portable 1 View  Result Date: 03/05/2020 CLINICAL DATA:  50 year old male with chest pain. Intermittent atrial fibrillation for 3 days. High heart rate. Vomited. EXAM: PORTABLE CHEST 1 VIEW COMPARISON:  Chest radiographs 01/26/2020 and earlier. FINDINGS: Portable AP semi upright view at 0356 hours. Lung volumes and mediastinal contours remain within normal limits. Visualized tracheal air column is within normal limits. Lung markings appear stable. Allowing for portable technique the lungs are clear. No pneumothorax or pleural effusion. Negative visible bowel gas and osseous structures. IMPRESSION: Negative portable chest. Electronically Signed   By: Odessa Fleming M.D.   On: 03/05/2020 04:02    Procedures .Critical Care Performed by: Barkley Boards, PA-C Authorized by: Barkley Boards, PA-C   Critical care provider statement:    Critical care time (minutes):  40   Critical care time was exclusive of:  Separately billable procedures and treating other patients and teaching time   Critical care was necessary to treat or prevent imminent or life-threatening deterioration of the following conditions: A fib with RVR.   Critical care was  time spent personally by me on the following activities:  Development of treatment plan with patient or surrogate, ordering and performing treatments and interventions, ordering  and review of laboratory studies, ordering and review of radiographic studies, pulse oximetry, re-evaluation of patient's condition, review of old charts, discussions with consultants, obtaining history from patient or surrogate, examination of patient and evaluation of patient's response to treatment   I assumed direction of critical care for this patient from another provider in my specialty: no     Care discussed with: admitting provider     Care discussed with comment:  Cardiology     Medications Ordered in ED Medications  diltiazem (CARDIZEM) 125 mg in dextrose 5% 125 mL (1 mg/mL) infusion (5 mg/hr Intravenous New Bag/Given 03/05/20 0647)  sodium chloride 0.9 % bolus 1,000 mL (0 mLs Intravenous Stopped 03/05/20 0526)    ED Course  I have reviewed the triage vital signs and the nursing notes.  Pertinent labs & imaging results that were available during my care of the patient were reviewed by me and considered in my medical decision making (see chart for details).    MDM Rules/Calculators/A&P                         CHA2DS2/VAS Stroke Risk Points  Current as of 16 minutes ago     2 >= 2 Points: High Risk  1 - 1.99 Points: Medium Risk  0 Points: Low Risk    Last Change: N/A      Details    This score determines the patient's risk of having a stroke if the  patient has atrial fibrillation.       Points Metrics  0 Has Congestive Heart Failure:  No    Current as of 16 minutes ago  0 Has Vascular Disease:  No    Current as of 16 minutes ago  1 Has Hypertension:  Yes    Current as of 16 minutes ago  0 Age:  89    Current as of 16 minutes ago  1 Has Diabetes:  Yes    Current as of 16 minutes ago  0 Had Stroke:  No  Had TIA:  No  Had Thromboembolism:  No    Current as of 16 minutes ago  0 Male:  No     Current as of 16 minutes ago    Moved here from home with a history of atrial flutter s/p partial ablation in 2/21, diabetes mellitus, hyperlipidemia, HTN who presents to the emergency department with a chief complaint of palpitations.  Patient reports that he has been an irregular heart rhythm for more than 48 hours.  He has a history of a flutter and states that his symptoms feel similar.  Reports that his smart watch also told him that he has been in a regular rhythm.  Rate has been fluctuating between normal and elevated since onset, but earlier tonight increased to 190 and he was feeling very dizzy and have 1 episode of vomiting.  He adamantly denies chest pain.  He is not anticoagulated.   The patient was discussed with Dr. Pilar Plate, attending physician.  Labs and imaging have been reviewed and independently interpreted by me.  EKG with A. fib with RVR.  Unfortunately, since symptoms have persisted for more than 48 hours and he is not anticoagulated currently, he is not a candidate for cardioversion.    Patient does report poor p.o. intake over the last few weeks given recent COVID-19 infection.  Attempted Valsalva maneuvers at bedside.  He had a brief improvement in rates into the 120s.  Also attempted  ice packs at bedside with no improvement.  Trialed IV fluids given his history of poor p.o. intake, but there was no improvement in rate or rhythm.  We will start diltiazem infusion.  Chest x-ray is reassuring.  No metabolic derangements.  Consult to cardiology since the patient is requiring diltiazem infusion.  Spoke with Laverda Page, NP, who will accept the patient for admission. The patient appears reasonably stabilized for admission considering the current resources, flow, and capabilities available in the ED at this time, and I doubt any other [facility] requiring further screening and/or treatment in the ED prior to admission.  Final Clinical Impression(s) / ED Diagnoses Final diagnoses:  Atrial  fibrillation with RVR [facility])    Rx / DC Orders ED Discharge Orders    None       Barkley Boards, PA-C 03/05/20 0715    Sabas Sous, MD 03/05/20 0716    Barkley Boards, PA-C 03/05/20 6151    Sabas Sous, MD 03/05/20 [phone]

## 2020-03-05 NOTE — ED Provider Notes (Signed)
Patient was seen last night for heart palpitation was found to be in atrial fibrillation with RVR.  Patient was seen and evaluated by nighttime provider please see her notes for complete H&P.  Patient subsequently was signed out to cardiology who was seen and evaluated patient.  At this time cardiology felt patient is stable for discharge and patient will be prescribed medication for rate control along with close outpatient follow-up.  His heart rate is now 105 and is stable for discharge discharge given.  BP (!) 151/72 (BP Location: Right Arm)   Pulse (!) 105   Temp 98 F (36.7 C) (Oral)   Resp 18   SpO2 98%   Results for orders placed or performed during the hospital encounter of 03/05/20  Basic metabolic panel  Result Value Ref Range   Sodium 136 135 - 145 mmol/L   Potassium 4.1 3.5 - 5.1 mmol/L   Chloride 101 98 - 111 mmol/L   CO2 24 22 - 32 mmol/L   Glucose, Bld 143 (H) 70 - 99 mg/dL   BUN 14 6 - 20 mg/dL   Creatinine, Ser 8.09 0.61 - 1.24 mg/dL   Calcium 8.7 (L) 8.9 - 10.3 mg/dL   GFR, Estimated >98 >33 mL/min   Anion gap 11 5 - 15  CBC  Result Value Ref Range   WBC 9.3 4.0 - 10.5 K/uL   RBC 4.29 4.22 - 5.81 MIL/uL   Hemoglobin 13.5 13.0 - 17.0 g/dL   HCT 82.5 05.3 - 97.6 %   MCV 94.9 80.0 - 100.0 fL   MCH 31.5 26.0 - 34.0 pg   MCHC 33.2 30.0 - 36.0 g/dL   RDW 73.4 19.3 - 79.0 %   Platelets 204 150 - 400 K/uL   nRBC 0.0 0.0 - 0.2 %   DG Chest Portable 1 View  Result Date: 03/05/2020 CLINICAL DATA:  50 year old male with chest pain. Intermittent atrial fibrillation for 3 days. High heart rate. Vomited. EXAM: PORTABLE CHEST 1 VIEW COMPARISON:  Chest radiographs 01/26/2020 and earlier. FINDINGS: Portable AP semi upright view at 0356 hours. Lung volumes and mediastinal contours remain within normal limits. Visualized tracheal air column is within normal limits. Lung markings appear stable. Allowing for portable technique the lungs are clear. No pneumothorax or pleural effusion.  Negative visible bowel gas and osseous structures. IMPRESSION: Negative portable chest. Electronically Signed   By: Odessa Fleming M.D.   On: 03/05/2020 04:02      Fayrene Helper, PA-C 03/05/20 1418    Melene Plan, DO 03/05/20 1420

## 2020-03-05 NOTE — ED Notes (Signed)
Ice pack applied to patient at this time.

## 2020-03-05 NOTE — ED Triage Notes (Signed)
Pt states that he has been in and out of afib since Sat, can tell when he goes in it, hx of afib and flutter, woke up tonight and his watch told him that his HR was up to 190, vomited x 1

## 2020-03-08 ENCOUNTER — Ambulatory Visit (HOSPITAL_COMMUNITY)
Admission: RE | Admit: 2020-03-08 | Discharge: 2020-03-08 | Disposition: A | Discharge status: Home / Self Care | Payer: 59 | Source: Ambulatory Visit | Attending: Nurse Practitioner | Admitting: Nurse Practitioner

## 2020-03-08 ENCOUNTER — Other Ambulatory Visit: Payer: Self-pay

## 2020-03-08 ENCOUNTER — Encounter (HOSPITAL_COMMUNITY): Payer: Self-pay | Admitting: Nurse Practitioner

## 2020-03-08 VITALS — BP 138/82 | HR 120 | Ht 68.0 in | Wt 196.0 lb

## 2020-03-08 DIAGNOSIS — Z8616 Personal history of COVID-19: Secondary | ICD-10-CM | POA: Insufficient documentation

## 2020-03-08 DIAGNOSIS — Z8249 Family history of ischemic heart disease and other diseases of the circulatory system: Secondary | ICD-10-CM | POA: Diagnosis not present

## 2020-03-08 DIAGNOSIS — Z79899 Other long term (current) drug therapy: Secondary | ICD-10-CM | POA: Diagnosis not present

## 2020-03-08 DIAGNOSIS — Z7901 Long term (current) use of anticoagulants: Secondary | ICD-10-CM | POA: Diagnosis not present

## 2020-03-08 DIAGNOSIS — D6869 Other thrombophilia: Secondary | ICD-10-CM

## 2020-03-08 DIAGNOSIS — I4819 Other persistent atrial fibrillation: Secondary | ICD-10-CM | POA: Diagnosis not present

## 2020-03-08 DIAGNOSIS — F17290 Nicotine dependence, other tobacco product, uncomplicated: Secondary | ICD-10-CM | POA: Diagnosis not present

## 2020-03-08 MED ORDER — DILTIAZEM HCL ER COATED BEADS 180 MG PO CP24: 180.0000 mg | ORAL_CAPSULE | Freq: Two times a day (BID) | ORAL | 6 refills | Status: DC

## 2020-03-08 NOTE — Progress Notes (Signed)
Primary Care Physician: Doreene Nest, NP Referring Physician: [facility] ER f/u EP: Dr. Hinton Dyer is a 50 y.o. male with a h/o  COVID-19 (02/01/20), atrial flutter s/p partial ablation in 2/21, diabetes mellitus, hyperlipidemia, HTN who presented to the emergency department, 2/22 with palpitations. Present x one week. Pt states that he was having some issues with irregular heart beat back in December.  He was found to have new onset afib with RVR. He was seen by Dr. Johney Frame and placed back on eliquis 5 mg bid for CHA2DS2VASc score of 2. (stopped after aflutter ablation). He was also started on diltiazem 180 mg daily. He had been off lisinopril.(This was not restarted since CCB was added but may need to be restarted if HTN elevates.) It was suggested to add flecainide after he was adequately  anticoagulated with possible subsequent cardioversion.   In the clinic today, EKG shows afib with RVR in the 120's. He is taking eliquis, first full day of anticoagualtion is today as he had only been taking once a day since Wednesday. He is taking 180 mg Cardizem in the am but has been taking the 30 mg Cardizem 3x a day to keep HR less than 100.   Today, he denies symptoms of palpitations, chest pain, shortness of breath, orthopnea, PND, lower extremity edema, dizziness, presyncope, syncope, or neurologic sequela. The patient is tolerating medications without difficulties and is otherwise without complaint today.   Past Medical History:  Diagnosis Date  . Essential hypertension   . Hyperglycemia   . Hyperlipidemia   . Overweight   . Typical atrial flutter [facility])    Past Surgical History:  Procedure Laterality Date  . A-FLUTTER ABLATION N/A 03/03/2019   Procedure: A-FLUTTER ABLATION;  Surgeon: Hillis Range, MD;  Location: [facility] INVASIVE CV LAB;  Service: Cardiovascular;  Laterality: N/A;  . none      Current Outpatient Medications  Medication Sig Dispense Refill  . apixaban (ELIQUIS) 5 MG  TABS tablet Take 1 tablet (5 mg total) by mouth 2 (two) times daily. 60 tablet 1  . ascorbic acid (VITAMIN C) 500 MG tablet Take 500 mg by mouth daily.    Marland Kitchen aspirin EC 81 MG tablet Take 162 mg by mouth every 4 (four) hours as needed for mild pain. Swallow whole.    . calcium carbonate (TUMS - DOSED IN MG ELEMENTAL CALCIUM) 500 MG chewable tablet Chew 2 tablets by mouth daily as needed for indigestion or heartburn.    . diltiazem (CARDIZEM CD) 180 MG 24 hr capsule Take 1 capsule (180 mg total) by mouth daily. 60 capsule 6  . diltiazem (CARDIZEM) 30 MG tablet Take 1 tablet (30 mg total) by mouth 3 (three) times daily as needed (for palpitations with heart rate > 110 despite taking the extended release medicine). 30 tablet 3  . ibuprofen (ADVIL) 200 MG tablet Take 400 mg by mouth every 6 (six) hours as needed for headache or moderate pain.    . Multiple Vitamins-Minerals (ONE-A-DAY VITACRAVES ADULT PO) Take 1 tablet by mouth daily.    . Pseudoephedrine-APAP-DM (DAYQUIL PO) Take 2 capsules by mouth daily as needed (congestion).     No current facility-administered medications for this encounter.    No Known Allergies  Social History   Socioeconomic History  . Marital status: Married    Spouse name: Not on file  . Number of children: 6  . Years of education: Not on file  . Highest education  level: Not on file  Occupational History  . Not on file  Tobacco Use  . Smoking status: Current Some Day Smoker    Types: Cigars  . Smokeless tobacco: Never Used  Vaping Use  . Vaping Use: Never used  Substance and Sexual Activity  . Alcohol use: Yes    Comment: 1-2 beers per day when off duty  . Drug use: Never  . Sexual activity: Not on file  Other Topics Concern  . Not on file  Social History Narrative   Married.   6 children.   Works for the [employer].   Lives in [place].   Enjoys hunting and fishing.    Social Determinants of Health   Financial Resource Strain:  Not on file  Food Insecurity: Not on file  Transportation Needs: Not on file  Physical Activity: Not on file  Stress: Not on file  Social Connections: Not on file  Intimate Partner Violence: Not on file    Family History  Problem Relation Age of Onset  . Hyperlipidemia Mother   . Heart disease Mother   . Diabetes Mother   . Hyperlipidemia Father   . Hypertension Sister   . Hyperlipidemia Sister     ROS- All systems are reviewed and negative except as per the HPI above  Physical Exam: There were no vitals filed for this visit. Wt Readings from Last 3 Encounters:  03/29/19 90.7 kg  03/03/19 88.9 kg  02/28/19 88.9 kg    Labs: Lab Results  Component Value Date   NA 136 03/05/2020   K 4.1 03/05/2020   CL 101 03/05/2020   CO2 24 03/05/2020   GLUCOSE 143 (H) 03/05/2020   BUN 14 03/05/2020   CREATININE 0.62 03/05/2020   CALCIUM 8.7 (L) 03/05/2020   MG 1.9 02/10/2019   No results found for: INR Lab Results  Component Value Date   CHOL 151 07/03/2016   HDL 45.20 07/03/2016   LDLCALC 92 07/03/2016   TRIG 70.0 07/03/2016     GEN- The patient is well appearing, alert and oriented x 3 today.   Head- normocephalic, atraumatic Eyes-  Sclera clear, conjunctiva pink Ears- hearing intact Oropharynx- clear Neck- supple, no JVP Lymph- no cervical lymphadenopathy Lungs- Clear to ausculation bilaterally, normal work of breathing Heart- Regular rate and rhythm, no murmurs, rubs or gallops, PMI not laterally displaced GI- soft, NT, ND, + BS Extremities- no clubbing, cyanosis, or edema MS- no significant deformity or atrophy Skin- no rash or lesion Psych- euthymic mood, full affect Neuro- strength and sensation are intact  EKG-  Afib with rvr at 120 bpm, qrs int 84 ms, qt 452 ms    Assessment and Plan: 1. New onset  persistent afib  Still with RVR Will stop frequent 30 mg cardizem and add 180 mg Cardizem in pm to his am 180 mg diltiazem He will watch his BP/HR at home  to ascertain he does have not  hypotension with the extra dose but has been tolerating at least an extra prn  120 mg daily   He has stopped dipping/ alcohol and caffeine since the ER visit, also stopped prn pseudoephedrine   2. CHA2DS2VASc score of 2(DM, HTN)  This will be first full day of anticoagulation since he was only taking once a day for the last 2 days  He will be eligible for cardioversion after 3/17 if no further missed doses   I will see back next week for BP/ekg visit   Lupita Leash  Eulah Pont, [facility] [address] [place], [place] [address] [phone]

## 2020-03-08 NOTE — Patient Instructions (Signed)
Increase cardizem (diltiazem) 180mg  twice a day

## 2020-03-11 ENCOUNTER — Encounter (HOSPITAL_COMMUNITY): Payer: Self-pay | Admitting: Nurse Practitioner

## 2020-03-11 ENCOUNTER — Ambulatory Visit (HOSPITAL_COMMUNITY)
Admission: RE | Admit: 2020-03-11 | Discharge: 2020-03-11 | Disposition: A | Discharge status: Home / Self Care | Payer: 59 | Source: Ambulatory Visit | Attending: Nurse Practitioner | Admitting: Nurse Practitioner

## 2020-03-11 ENCOUNTER — Other Ambulatory Visit: Payer: Self-pay

## 2020-03-11 VITALS — BP 158/60 | HR 98

## 2020-03-11 DIAGNOSIS — I517 Cardiomegaly: Secondary | ICD-10-CM | POA: Diagnosis not present

## 2020-03-11 DIAGNOSIS — R9431 Abnormal electrocardiogram [ECG] [EKG]: Secondary | ICD-10-CM | POA: Insufficient documentation

## 2020-03-11 DIAGNOSIS — I4819 Other persistent atrial fibrillation: Secondary | ICD-10-CM

## 2020-03-11 DIAGNOSIS — I4891 Unspecified atrial fibrillation: Secondary | ICD-10-CM | POA: Diagnosis present

## 2020-03-11 NOTE — Progress Notes (Signed)
Pt in for repeat EKG as he had  RVR on last visit, and rate control was increased. On Sunday, his phone was reporting SR. EKG today shows NSR at 98 bpm. He will continue current anticoagulation, rate control. I will see him back in 2 weeks.

## 2020-03-13 ENCOUNTER — Encounter (HOSPITAL_COMMUNITY): Payer: Self-pay

## 2020-03-14 ENCOUNTER — Other Ambulatory Visit: Payer: Self-pay

## 2020-03-14 ENCOUNTER — Ambulatory Visit (HOSPITAL_COMMUNITY)
Admission: RE | Admit: 2020-03-14 | Discharge: 2020-03-14 | Disposition: A | Discharge status: Home / Self Care | Payer: 59 | Source: Ambulatory Visit | Attending: Physician Assistant | Admitting: Physician Assistant

## 2020-03-14 ENCOUNTER — Encounter (HOSPITAL_COMMUNITY): Payer: Self-pay | Admitting: Physician Assistant

## 2020-03-14 VITALS — BP 132/68 | HR 90 | Ht 68.0 in | Wt 202.8 lb

## 2020-03-14 DIAGNOSIS — E785 Hyperlipidemia, unspecified: Secondary | ICD-10-CM | POA: Insufficient documentation

## 2020-03-14 DIAGNOSIS — I480 Paroxysmal atrial fibrillation: Secondary | ICD-10-CM | POA: Diagnosis not present

## 2020-03-14 DIAGNOSIS — Z8616 Personal history of COVID-19: Secondary | ICD-10-CM | POA: Insufficient documentation

## 2020-03-14 DIAGNOSIS — Z7901 Long term (current) use of anticoagulants: Secondary | ICD-10-CM | POA: Insufficient documentation

## 2020-03-14 DIAGNOSIS — R600 Localized edema: Secondary | ICD-10-CM | POA: Insufficient documentation

## 2020-03-14 DIAGNOSIS — I10 Essential (primary) hypertension: Secondary | ICD-10-CM | POA: Diagnosis not present

## 2020-03-14 DIAGNOSIS — D6869 Other thrombophilia: Secondary | ICD-10-CM | POA: Insufficient documentation

## 2020-03-14 DIAGNOSIS — I4892 Unspecified atrial flutter: Secondary | ICD-10-CM | POA: Diagnosis not present

## 2020-03-14 DIAGNOSIS — Z87891 Personal history of nicotine dependence: Secondary | ICD-10-CM | POA: Diagnosis not present

## 2020-03-14 DIAGNOSIS — I491 Atrial premature depolarization: Secondary | ICD-10-CM | POA: Diagnosis not present

## 2020-03-14 DIAGNOSIS — I4891 Unspecified atrial fibrillation: Secondary | ICD-10-CM | POA: Diagnosis present

## 2020-03-14 MED ORDER — FLECAINIDE ACETATE 50 MG PO TABS: 50.0000 mg | ORAL_TABLET | Freq: Two times a day (BID) | ORAL | 3 refills | Status: DC

## 2020-03-14 MED ORDER — DILTIAZEM HCL ER COATED BEADS 180 MG PO CP24: 180.0000 mg | ORAL_CAPSULE | Freq: Every day | ORAL | 6 refills | Status: DC

## 2020-03-14 MED ORDER — FUROSEMIDE 20 MG PO TABS: ORAL_TABLET | ORAL | 0 refills | Status: DC

## 2020-03-14 NOTE — Patient Instructions (Signed)
Decrease cardizem (diltiazem) to 180mg  once a day  Start flecainide 50mg  twice a day  Start lasix 20mg  once a day until we see you back on Monday

## 2020-03-14 NOTE — Progress Notes (Signed)
Primary Care Physician: Doreene Nest, NP Referring Physician: [facility] f/u EP: Kevin Branch is a 50 y.o. male with a h/o  COVID-19 (02/01/20), atrial flutter s/p partial ablation in 2/21, diabetes mellitus, hyperlipidemia, HTN who presented to the emergency department, 2/22 with palpitations. Present x one week. Pt states that he was having some issues with irregular heart beat back in December.  He was found to have new onset afib with RVR. He was seen by Kevin Branch and placed back on eliquis 5 mg bid for CHA2DS2VASc score of 2. (stopped after aflutter ablation). He was also started on diltiazem 180 mg daily. He had been off lisinopril.(This was not restarted since CCB was added but may need to be restarted if HTN elevates.) It was suggested to add flecainide after he was adequately  anticoagulated with possible subsequent cardioversion.   In the clinic 03/08/20, EKG shows afib with RVR in the 120's. He is taking eliquis, first full day of anticoagualtion is today as he had only been taking once a day since Wednesday. He is taking 180 mg Cardizem in the am but has been taking the 30 mg Cardizem 3x a day to keep HR less than 100. Patient seen 03/11/20 after increasing diltiazem and he was in SR.   Follow up in the AF clinic 03/14/20. Patient reports that he has had several episodes of tachypalpitations since his last visit despite increasing diltiazem. He has also noted much more lower extremity edema and an 8 lbs weight gain. He denies any bleeding issues on anticoagulation.   Today, he denies symptoms of chest pain, shortness of breath, orthopnea, PND, dizziness, presyncope, syncope, or neurologic sequela. The patient is tolerating medications without difficulties and is otherwise without complaint today.   Past Medical History:  Diagnosis Date  . Essential hypertension   . Hyperglycemia   . Hyperlipidemia   . Overweight   . Typical atrial flutter [facility])    Past Surgical  History:  Procedure Laterality Date  . A-FLUTTER ABLATION N/A 03/03/2019   Procedure: A-FLUTTER ABLATION;  Surgeon: Hillis Range, MD;  Location: [facility] INVASIVE CV LAB;  Service: Cardiovascular;  Laterality: N/A;  . none      Current Outpatient Medications  Medication Sig Dispense Refill  . apixaban (ELIQUIS) 5 MG TABS tablet Take 1 tablet (5 mg total) by mouth 2 (two) times daily. 60 tablet 1  . ascorbic acid (VITAMIN C) 500 MG tablet Take 500 mg by mouth daily.    . calcium carbonate (TUMS - DOSED IN MG ELEMENTAL CALCIUM) 500 MG chewable tablet Chew 2 tablets by mouth daily as needed for indigestion or heartburn.    . diltiazem (CARDIZEM CD) 180 MG 24 hr capsule Take 1 capsule (180 mg total) by mouth in the morning and at bedtime. 60 capsule 6  . diltiazem (CARDIZEM) 30 MG tablet Take 1 tablet (30 mg total) by mouth 3 (three) times daily as needed (for palpitations with heart rate > 110 despite taking the extended release medicine). 30 tablet 3  . ibuprofen (ADVIL) 200 MG tablet Take 400 mg by mouth every 6 (six) hours as needed for headache or moderate pain.    . Multiple Vitamins-Minerals (ONE-A-DAY VITACRAVES ADULT PO) Take 1 tablet by mouth daily.    . Omega-3 Fatty Acids (FISH OIL ADULT GUMMIES PO) Take by mouth.     No current facility-administered medications for this encounter.    No Known Allergies  Social History  Socioeconomic History  . Marital status: Married    Spouse name: Not on file  . Number of children: 6  . Years of education: Not on file  . Highest education level: Not on file  Occupational History  . Not on file  Tobacco Use  . Smoking status: Former Smoker    Types: Cigars  . Smokeless tobacco: Former Neurosurgeon    Types: Snuff  Vaping Use  . Vaping Use: Never used  Substance and Sexual Activity  . Alcohol use: Not Currently    Comment: 1-2 beers per day when off duty  . Drug use: Never  . Sexual activity: Not on file  Other Topics Concern  . Not on file   Social History Narrative   Married.   6 children.   Works for the [employer].   Lives in [place].   Enjoys hunting and fishing.    Social Determinants of Health   Financial Resource Strain: Not on file  Food Insecurity: Not on file  Transportation Needs: Not on file  Physical Activity: Not on file  Stress: Not on file  Social Connections: Not on file  Intimate Partner Violence: Not on file    Family History  Problem Relation Age of Onset  . Hyperlipidemia Mother   . Heart disease Mother   . Diabetes Mother   . Hyperlipidemia Father   . Hypertension Sister   . Hyperlipidemia Sister     ROS- All systems are reviewed and negative except as per the HPI above  Physical Exam: Vitals:   03/14/20 1008  BP: 132/68  Pulse: 90  Weight: 92 kg  Height: 5\' 8"  (1.727 m)   Wt Readings from Last 3 Encounters:  03/14/20 92 kg  03/08/20 88.9 kg  03/29/19 90.7 kg    Labs: Lab Results  Component Value Date   NA 136 03/05/2020   K 4.1 03/05/2020   CL 101 03/05/2020   CO2 24 03/05/2020   GLUCOSE 143 (H) 03/05/2020   BUN 14 03/05/2020   CREATININE 0.62 03/05/2020   CALCIUM 8.7 (L) 03/05/2020   MG 1.9 02/10/2019   No results found for: INR Lab Results  Component Value Date   CHOL 151 07/03/2016   HDL 45.20 07/03/2016   LDLCALC 92 07/03/2016   TRIG 70.0 07/03/2016   GEN- The patient is a well appearing obese male, alert and oriented x 3 today.   HEENT-head normocephalic, atraumatic, sclera clear, conjunctiva pink, hearing intact, trachea midline. Lungs- Clear to ausculation bilaterally, normal work of breathing Heart- Regular rate and rhythm, no murmurs, rubs or gallops  GI- soft, NT, ND, + BS Extremities- no clubbing, cyanosis. 1-2+ bilateral lower extremity edema MS- no significant deformity or atrophy Skin- no rash or lesion Psych- euthymic mood, full affect Neuro- strength and sensation are intact   EKG-  SR, PAC Vent. rate 90 BPM PR  interval 152 ms QRS duration 88 ms QT/QTc 366/447 ms   Assessment and Plan: 1. Paroxysmal afib/typical atrial flutter Patient in SR in office today but he reports several episodes with rapid heart rates since his last visit.  Will start flecainide 50 mg BID Decrease diltiazem to 180 mg Continue Eliquis 5 mg BID  2. CHA2DS2VASc score of 2(DM, HTN)  Continue Eliquis 5 mg BID  3. HTN Stable, med changes as above.  4. Lower extremity edema Suspect secondary to rapid rates and high dose CCB. Will decrease diltiazem as above.  Start Lasix 20 mg daily until follow up.  Follow up in AF clinic early next week.    Kevin Loa PA-C Afib Clinic [facility] [address] [place], [place] [address] [phone]

## 2020-03-18 ENCOUNTER — Encounter (HOSPITAL_COMMUNITY): Payer: Self-pay | Admitting: Physician Assistant

## 2020-03-18 ENCOUNTER — Other Ambulatory Visit (HOSPITAL_COMMUNITY): Payer: Self-pay | Admitting: *Deleted

## 2020-03-18 ENCOUNTER — Ambulatory Visit (HOSPITAL_COMMUNITY)
Admission: RE | Admit: 2020-03-18 | Discharge: 2020-03-18 | Disposition: A | Discharge status: Home / Self Care | Payer: 59 | Source: Ambulatory Visit | Attending: Nurse Practitioner | Admitting: Nurse Practitioner

## 2020-03-18 ENCOUNTER — Other Ambulatory Visit: Payer: Self-pay

## 2020-03-18 VITALS — BP 120/60 | HR 96 | Ht 68.0 in | Wt 203.0 lb

## 2020-03-18 DIAGNOSIS — E785 Hyperlipidemia, unspecified: Secondary | ICD-10-CM | POA: Insufficient documentation

## 2020-03-18 DIAGNOSIS — I10 Essential (primary) hypertension: Secondary | ICD-10-CM | POA: Diagnosis not present

## 2020-03-18 DIAGNOSIS — Z8616 Personal history of COVID-19: Secondary | ICD-10-CM | POA: Insufficient documentation

## 2020-03-18 DIAGNOSIS — Z7901 Long term (current) use of anticoagulants: Secondary | ICD-10-CM | POA: Insufficient documentation

## 2020-03-18 DIAGNOSIS — R600 Localized edema: Secondary | ICD-10-CM | POA: Diagnosis not present

## 2020-03-18 DIAGNOSIS — I483 Typical atrial flutter: Secondary | ICD-10-CM | POA: Diagnosis not present

## 2020-03-18 DIAGNOSIS — Z79899 Other long term (current) drug therapy: Secondary | ICD-10-CM | POA: Insufficient documentation

## 2020-03-18 DIAGNOSIS — Z87891 Personal history of nicotine dependence: Secondary | ICD-10-CM | POA: Insufficient documentation

## 2020-03-18 DIAGNOSIS — I480 Paroxysmal atrial fibrillation: Secondary | ICD-10-CM | POA: Diagnosis not present

## 2020-03-18 DIAGNOSIS — D6869 Other thrombophilia: Secondary | ICD-10-CM

## 2020-03-18 LAB — COMPREHENSIVE METABOLIC PANEL
ALT: 34 U/L (ref 0–44)
AST: 54 U/L — ABNORMAL HIGH (ref 15–41)
Albumin: 2.4 g/dL — ABNORMAL LOW (ref 3.5–5.0)
Alkaline Phosphatase: 117 U/L (ref 38–126)
Anion gap: 10 (ref 5–15)
BUN: 7 mg/dL (ref 6–20)
CO2: 27 mmol/L (ref 22–32)
Calcium: 8.5 mg/dL — ABNORMAL LOW (ref 8.9–10.3)
Chloride: 99 mmol/L (ref 98–111)
Creatinine, Ser: 0.4 mg/dL — ABNORMAL LOW (ref 0.61–1.24)
GFR, Estimated: >60 mL/min (ref >60)
Glucose, Bld: 93 mg/dL (ref 70–99)
Potassium: 3.4 mmol/L — ABNORMAL LOW (ref 3.5–5.1)
Sodium: 136 mmol/L (ref 135–145)
Total Bilirubin: 2.4 mg/dL — ABNORMAL HIGH (ref 0.3–1.2)
Total Protein: 6.8 g/dL (ref 6.5–8.1)

## 2020-03-18 MED ORDER — POTASSIUM CHLORIDE ER 10 MEQ PO TBCR: 10.0000 meq | EXTENDED_RELEASE_TABLET | Freq: Every day | ORAL | 3 refills | Status: DC

## 2020-03-18 MED ORDER — FUROSEMIDE 20 MG PO TABS: 20.0000 mg | ORAL_TABLET | Freq: Every day | ORAL | 1 refills | Status: DC

## 2020-03-18 NOTE — Patient Instructions (Signed)
Lasix --- take 40mg  once a day for the next 3 days then go back to 20mg  once a day everyday.  Follow up with [facility] 1 week

## 2020-03-18 NOTE — Progress Notes (Signed)
Primary Care Physician: Doreene Nest, NP Referring Physician: [facility] f/u EP: Dr. Johney Frame  Primary Cardiology: Dr Clarisa Kindred is a 50 y.o. male with a h/o  COVID-19 (02/01/20), atrial flutter s/p partial ablation in 2/21, diabetes mellitus, hyperlipidemia, HTN who presented to the emergency department, 2/22 with palpitations. Present x one week. Pt states that he was having some issues with irregular heart beat back in December.  He was found to have new onset afib with RVR. He was seen by Dr. Johney Frame and placed back on eliquis 5 mg bid for CHA2DS2VASc score of 2. (stopped after aflutter ablation). He was also started on diltiazem 180 mg daily. He had been off lisinopril.(This was not restarted since CCB was added but may need to be restarted if HTN elevates.) It was suggested to add flecainide after he was adequately  anticoagulated with possible subsequent cardioversion.   In the clinic 03/08/20, EKG shows afib with RVR in the 120's. He is taking eliquis, first full day of anticoagualtion is today as he had only been taking once a day since Wednesday. He is taking 180 mg Cardizem in the am but has been taking the 30 mg Cardizem 3x a day to keep HR less than 100. Patient seen 03/11/20 after increasing diltiazem and he was in SR.   Follow up in the AF clinic 03/14/20. Patient reports that he has had several episodes of tachypalpitations since his last visit despite increasing diltiazem. He has also noted much more lower extremity edema and an 8 lbs weight gain. He denies any bleeding issues on anticoagulation.   Follow up in the AF clinic 03/18/20. Patient reports that he has not had tachypalpitations since starting flecainide. However, his lower extremity edema has not improved. He denies any bleeding issues on anticoagulation.   Today, he denies symptoms of palpitations, chest pain, shortness of breath, orthopnea, PND, dizziness, presyncope, syncope, or neurologic sequela. The  patient is tolerating medications without difficulties and is otherwise without complaint today.   Past Medical History:  Diagnosis Date  . Essential hypertension   . Hyperglycemia   . Hyperlipidemia   . Overweight   . Typical atrial flutter [facility])    Past Surgical History:  Procedure Laterality Date  . A-FLUTTER ABLATION N/A 03/03/2019   Procedure: A-FLUTTER ABLATION;  Surgeon: Hillis Range, MD;  Location: [facility] INV[facility] CV LAB;  Service: Cardiovascular;  Laterality: N/A;  . none      Current Outpatient Medications  Medication Sig Dispense Refill  . apixaban (ELIQUIS) 5 MG TABS tablet Take 1 tablet (5 mg total) by mouth 2 (two) times daily. 60 tablet 1  . ascorbic acid (VITAMIN C) 500 MG tablet Take 500 mg by mouth daily.    . calcium carbonate (TUMS - DOSED IN MG ELEMENTAL CALCIUM) 500 MG chewable tablet Chew 2 tablets by mouth daily as needed for indigestion or heartburn.    . diltiazem (CARDIZEM CD) 180 MG 24 hr capsule Take 1 capsule (180 mg total) by mouth daily. 60 capsule 6  . diltiazem (CARDIZEM) 30 MG tablet Take 1 tablet (30 mg total) by mouth 3 (three) times daily as needed (for palpitations with heart rate > 110 despite taking the extended release medicine). 30 tablet 3  . flecainide (TAMBOCOR) 50 MG tablet Take 1 tablet (50 mg total) by mouth 2 (two) times daily. 60 tablet 3  . furosemide (LASIX) 20 MG tablet Take 1 tablet by mouth daily for the next 5  days then only as needed for swelling/wt gain 10 tablet 0  . ibuprofen (ADVIL) 200 MG tablet Take 400 mg by mouth every 6 (six) hours as needed for headache or moderate pain.    . Multiple Vitamins-Minerals (ONE-A-DAY VITACRAVES ADULT PO) Take 1 tablet by mouth daily.    . Omega-3 Fatty Acids (FISH OIL ADULT GUMMIES PO) Take by mouth.     No current facility-administered medications for this visit.    No Known Allergies  Social History   Socioeconomic History  . Marital status: Married    Spouse name: Not on file  .  Number of children: 6  . Years of education: Not on file  . Highest education level: Not on file  Occupational History  . Not on file  Tobacco Use  . Smoking status: Former Smoker    Types: Cigars  . Smokeless tobacco: Former Neurosurgeon    Types: Snuff  Vaping Use  . Vaping Use: Never used  Substance and Sexual Activity  . Alcohol use: Not Currently    Comment: 1-2 beers per day when off duty  . Drug use: Never  . Sexual activity: Not on file  Other Topics Concern  . Not on file  Social History Narrative   Married.   6 children.   Works for the [employer].   Lives in [place].   Enjoys hunting and fishing.    Social Determinants of Health   Financial Resource Strain: Not on file  Food Insecurity: Not on file  Transportation Needs: Not on file  Physical Activity: Not on file  Stress: Not on file  Social Connections: Not on file  Intimate Partner Violence: Not on file    Family History  Problem Relation Age of Onset  . Hyperlipidemia Mother   . Heart disease Mother   . Diabetes Mother   . Hyperlipidemia Father   . Hypertension Sister   . Hyperlipidemia Sister     ROS- All systems are reviewed and negative except as per the HPI above  Physical Exam: There were no vitals filed for this visit. Wt Readings from Last 3 Encounters:  03/14/20 92 kg  03/08/20 88.9 kg  03/29/19 90.7 kg    Labs: Lab Results  Component Value Date   NA 136 03/05/2020   K 4.1 03/05/2020   CL 101 03/05/2020   CO2 24 03/05/2020   GLUCOSE 143 (H) 03/05/2020   BUN 14 03/05/2020   CREATININE 0.62 03/05/2020   CALCIUM 8.7 (L) 03/05/2020   MG 1.9 02/10/2019   No results found for: INR Lab Results  Component Value Date   CHOL 151 07/03/2016   HDL 45.20 07/03/2016   LDLCALC 92 07/03/2016   TRIG 70.0 07/03/2016   GEN- The patient is a well appearing obese male, alert and oriented x 3 today.   HEENT-head normocephalic, atraumatic, sclera clear, conjunctiva pink,  hearing intact, trachea midline. Lungs- Clear to ausculation bilaterally, normal work of breathing Heart- Regular rate and rhythm, no murmurs, rubs or gallops  GI- soft, NT, ND, + BS Extremities- no clubbing, cyanosis. 2+ bilateral edema MS- no significant deformity or atrophy Skin- no rash or lesion Psych- euthymic mood, full affect Neuro- strength and sensation are intact   EKG-   SR Vent. rate 96 BPM PR interval 146 ms QRS duration 86 ms QT/QTc 372/469 ms   Assessment and Plan: 1. Paroxysmal afib/typical atrial flutter Patient in SR since starting flecainide.  Continue flecainide 50 mg BID Continue diltiazem 180  mg Continue Eliquis 5 mg BID  2. CHA2DS2VASc score of 2(DM, HTN)  Continue Eliquis 5 mg BID  3. HTN Stable, no changes today.  4. Lower extremity edema Not improved despite SR and decreased dose of CCB.  Check Cmet Increase Lasix to 40 mg x 3 days then decrease back to 20 mg daily.   Follow up with his general cardiologist Dr Rosemary Holms.   Jorja Loa PA-C Afib Clinic [facility] [address] [place], [place] [address] [phone]

## 2020-03-25 ENCOUNTER — Other Ambulatory Visit: Payer: Self-pay

## 2020-03-25 ENCOUNTER — Encounter: Payer: Self-pay | Admitting: Student

## 2020-03-25 ENCOUNTER — Inpatient Hospital Stay (HOSPITAL_COMMUNITY)
Admission: EM | Admit: 2020-03-25 | Discharge: 2020-03-28 | DRG: 291 | Disposition: A | Discharge status: Home / Self Care | Payer: 59 | Attending: Internal Medicine | Admitting: Internal Medicine

## 2020-03-25 ENCOUNTER — Emergency Department (HOSPITAL_COMMUNITY): Payer: 59

## 2020-03-25 ENCOUNTER — Encounter (HOSPITAL_COMMUNITY): Payer: Self-pay | Admitting: Emergency Medicine

## 2020-03-25 ENCOUNTER — Ambulatory Visit: Payer: 59 | Admitting: Student

## 2020-03-25 VITALS — BP 131/66 | HR 90 | Temp 98.2°F | Resp 16 | Ht 68.0 in | Wt 201.0 lb

## 2020-03-25 DIAGNOSIS — D649 Anemia, unspecified: Secondary | ICD-10-CM | POA: Diagnosis present

## 2020-03-25 DIAGNOSIS — E119 Type 2 diabetes mellitus without complications: Secondary | ICD-10-CM | POA: Diagnosis present

## 2020-03-25 DIAGNOSIS — I4892 Unspecified atrial flutter: Secondary | ICD-10-CM | POA: Diagnosis present

## 2020-03-25 DIAGNOSIS — Z79899 Other long term (current) drug therapy: Secondary | ICD-10-CM

## 2020-03-25 DIAGNOSIS — R5383 Other fatigue: Secondary | ICD-10-CM

## 2020-03-25 DIAGNOSIS — Z83438 Family history of other disorder of lipoprotein metabolism and other lipidemia: Secondary | ICD-10-CM

## 2020-03-25 DIAGNOSIS — I10 Essential (primary) hypertension: Secondary | ICD-10-CM | POA: Diagnosis present

## 2020-03-25 DIAGNOSIS — I480 Paroxysmal atrial fibrillation: Secondary | ICD-10-CM

## 2020-03-25 DIAGNOSIS — Z833 Family history of diabetes mellitus: Secondary | ICD-10-CM

## 2020-03-25 DIAGNOSIS — E0590 Thyrotoxicosis, unspecified without thyrotoxic crisis or storm: Secondary | ICD-10-CM | POA: Diagnosis present

## 2020-03-25 DIAGNOSIS — Z20822 Contact with and (suspected) exposure to covid-19: Secondary | ICD-10-CM | POA: Diagnosis present

## 2020-03-25 DIAGNOSIS — E785 Hyperlipidemia, unspecified: Secondary | ICD-10-CM | POA: Diagnosis present

## 2020-03-25 DIAGNOSIS — R0602 Shortness of breath: Secondary | ICD-10-CM

## 2020-03-25 DIAGNOSIS — R252 Cramp and spasm: Secondary | ICD-10-CM

## 2020-03-25 DIAGNOSIS — Z8249 Family history of ischemic heart disease and other diseases of the circulatory system: Secondary | ICD-10-CM

## 2020-03-25 DIAGNOSIS — F17210 Nicotine dependence, cigarettes, uncomplicated: Secondary | ICD-10-CM | POA: Diagnosis present

## 2020-03-25 DIAGNOSIS — R0601 Orthopnea: Secondary | ICD-10-CM

## 2020-03-25 DIAGNOSIS — Z7901 Long term (current) use of anticoagulants: Secondary | ICD-10-CM

## 2020-03-25 DIAGNOSIS — I110 Hypertensive heart disease with heart failure: Secondary | ICD-10-CM | POA: Diagnosis not present

## 2020-03-25 DIAGNOSIS — E876 Hypokalemia: Secondary | ICD-10-CM | POA: Diagnosis present

## 2020-03-25 DIAGNOSIS — I5031 Acute diastolic (congestive) heart failure: Secondary | ICD-10-CM | POA: Diagnosis present

## 2020-03-25 DIAGNOSIS — I509 Heart failure, unspecified: Secondary | ICD-10-CM

## 2020-03-25 DIAGNOSIS — I248 Other forms of acute ischemic heart disease: Secondary | ICD-10-CM | POA: Diagnosis present

## 2020-03-25 MED ORDER — FUROSEMIDE 20 MG PO TABS: 40.0000 mg | ORAL_TABLET | Freq: Every day | ORAL | 1 refills | Status: DC

## 2020-03-25 NOTE — ED Triage Notes (Addendum)
Patient reports central chest tightness/SOB and palpitations Afib this evening with lower legs edema , occasional productive cough/orthopnea.

## 2020-03-25 NOTE — Progress Notes (Signed)
Patient referred by [name], NP for atrial flutter  Subjective:   Kevin Branch, male    DOB: 02-10-1970, 50 y.o.   MRN: [id number]   Chief Complaint  Patient presents with  . Atrial Fibrillation  . Follow-up   50 y.o. Caucasian male with hypertension, presented to [name] ED on 02/10/2019 with typical atrial flutter with RVR.  Given onset of symptoms of only 2-3 days, and event successful direct-current cardioversion with conversion to normal rhythm.   Patient was last seen in our office by Dr. [name] 02/22/2019.  Since that visit patient presented to the emergency department in typical atrial flutter, he subsequently underwent a flutter ablation by Dr. [name] on 03/03/2019.  Patient then again presented to the [facility] emergency department 03/05/2020 in A. fib with RVR, he was therefore referred to the atrial fibrillation clinic.  Patient is now on flecainide, diltiazem, and Eliquis and has been maintaining sinus rhythm for approximately 2 weeks.  He now presents for follow-up.  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  He states that over the last 6 to 8 months he has noticed increased swelling in his legs and abdomen as well as fatigue.  He denies chest pain, dyspnea, symptoms suggestive of TIA/CVA, syncope, near syncope.  He does report orthopnea.  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  Notably patient quit using smokeless tobacco approximately 2 weeks ago, however he was previously a daily user.  Patient is presently telling Eliquis without bleeding diathesis.  He also complains of symptoms of claudication.  Current Outpatient Medications on File Prior to Visit  Medication Sig Dispense Refill  . acetaminophen (TYLENOL) 325 MG tablet Take 650 mg by mouth every 6 (six) hours as needed.    Marland Kitchen apixaban (ELIQUIS) 5 MG TABS tablet Take 1 tablet (5 mg total) by mouth 2 (two) times daily. 60 tablet 1  . ascorbic  acid (VITAMIN C) 500 MG tablet Take 500 mg by mouth daily.    . calcium carbonate (TUMS - DOSED IN MG ELEMENTAL CALCIUM) 500 MG chewable tablet Chew 2 tablets by mouth daily as needed for indigestion or heartburn.    . diltiazem (CARDIZEM CD) 180 MG 24 hr capsule Take 1 capsule (180 mg total) by mouth daily. 60 capsule 6  . diltiazem (CARDIZEM) 30 MG tablet Take 1 tablet (30 mg total) by mouth 3 (three) times daily as needed (for palpitations with heart rate > 110 despite taking the extended release medicine). 30 tablet 3  . flecainide (TAMBOCOR) 50 MG tablet Take 1 tablet (50 mg total) by mouth 2 (two) times daily. 60 tablet 3  . Multiple Vitamins-Minerals (ONE-A-DAY VITACRAVES ADULT PO) Take 1 tablet by mouth daily.    . Omega-3 Fatty Acids (FISH OIL ADULT GUMMIES PO) Take by mouth.    . potassium chloride (KLOR-CON) 10 MEQ tablet Take 1 tablet (10 mEq total) by mouth daily. 30 tablet 3   No current facility-administered medications on file prior to visit.    Cardiovascular and other pertinent studies: EKG 03/25/2020:  Sinus rhythm at a rate of 86 bpm.  Left atrial abnormality.  Normal axis.  Incomplete right bundle branch block. Non-specific ST-T changes, cannot exclude anteroseptal ischemia.   A flutter ablation: 03/03/2019  Cardioversion: 02/10/2019  EKG 02/21/2018: Typical atrial flutter 102 bpm Variable conduction  EKG 02/10/2019: Typical atrial flutter 155 bpm   Recent labs: 03/18/2020: Sodium 136, potassium 3.4, BUN 17 creatinine  0.4, AST 54, ALT 34, alk phos 117, GFR >60  03/05/2020:  Hemoglobin 13.5, hematocrit 40.7, MCV 94.9, platelets 204 Sodium 136, potassium 4.1, glucose 143, BUN 14, creatinine 0.62, GFR >60  02/10/2019: Glucose 248, BUN/Cr 12/0.47. EGFR >60. Na/K 137/3.6.  Calcium: 8.6 H/H 13.7/39.6. MCV 91.5. Platelets 171 D-Dimer 0.43  06/2016: Chol 151, TG 70, HDL 45, LDL 92   Review of Systems  Constitutional: Positive for malaise/fatigue. Negative for weight  gain.  Cardiovascular: Positive for leg swelling and orthopnea. Negative for chest pain, claudication, dyspnea on exertion, near-syncope, palpitations, paroxysmal nocturnal dyspnea and syncope.  Respiratory: Positive for shortness of breath.   Hematologic/Lymphatic: Does not bruise/bleed easily.  Gastrointestinal: Negative for melena.  Neurological: Negative for dizziness and weakness.         Vitals:   03/25/20 0838  BP: 131/66  Pulse: 90  Resp: 16  Temp: 98.2 F (36.8 C)  SpO2: 97%     Body mass index is 30.56 kg/m. Filed Weights   03/25/20 0838  Weight: 201 lb (91.2 kg)     Objective:   Physical Exam Vitals and nursing note reviewed.  Constitutional:      Appearance: He is well-developed.  Neck:     Vascular: No JVD.  Cardiovascular:     Rate and Rhythm: Normal rate and regular rhythm.     Pulses: Intact distal pulses.          Carotid pulses are 2+ on the right side and 2+ on the left side.      Femoral pulses are 1+ on the right side and 1+ on the left side.      Popliteal pulses are 1+ on the right side and 1+ on the left side.       Dorsalis pedis pulses are 0 on the right side and 0 on the left side.       Posterior tibial pulses are 1+ on the right side and 1+ on the left side.     Heart sounds: Normal heart sounds, S1 normal and S2 normal. No murmur heard.     Comments: No JVD.  Pulmonary:     Effort: Pulmonary effort is normal.     Breath sounds: Normal breath sounds. No wheezing or rales.  Abdominal:     General: Bowel sounds are normal. There is no distension.     Palpations: Abdomen is soft.  Musculoskeletal:     Right lower leg: Edema (trace) present.     Left lower leg: Edema (trace) present.  Skin:    General: Skin is warm and dry.         Assessment & Recommendations:    50 y.o. Caucasian male with hypertension, paroxysmal atrial flutter, history of tobacco use, diabetes mellitus, hyperlipidemia.  Also has history of atrial  fibrillation/flutter, status post atrial flutter ablation.  Paroxysmal atrial flutter: S/p cardioversion 02/10/19.  Status post atrial flutter ablation 03/03/2019. Presently maintaining sinus rhythm.  Continue flecainide, diltiazem, and Eliquis. This patients CHA2DS2-VASc Score 2 (HTN, DM) and yearly risk of stroke 2.2%.   Bilateral lower leg edema, orthopnea/ shortness of breath: Obtain echocardiogram. Obtain lab work including BNP, BMP, TSH, CBC, lipid profile testing  Continue furosemide 40 mg daily Obtain nuclear stress testing, also in view of non-specific ST-T changes mildly more prominent compared to previous EKG.   Fatigue: Evaluation per above.   Claudication:  Will obtain ABI   Hypertension:  Well controlled Continue diltiazem, furosemide  Counseled patient regarding signs and symptoms  that would warrant emergent evaluation, he verbalized understanding and agreement.   Follow-up in 4 weeks, sooner if needed, for results of cardiac testing and paroxysmal atrial fibrillation/flutter.    Alethia Berthold, PA-C 03/25/2020, 9:44 AM Office: [phone]

## 2020-03-26 ENCOUNTER — Inpatient Hospital Stay (HOSPITAL_COMMUNITY): Payer: 59

## 2020-03-26 ENCOUNTER — Ambulatory Visit (HOSPITAL_COMMUNITY): Payer: 59 | Admitting: Nurse Practitioner

## 2020-03-26 ENCOUNTER — Encounter (HOSPITAL_COMMUNITY): Payer: Self-pay | Admitting: Internal Medicine

## 2020-03-26 DIAGNOSIS — F17210 Nicotine dependence, cigarettes, uncomplicated: Secondary | ICD-10-CM | POA: Diagnosis not present

## 2020-03-26 DIAGNOSIS — R609 Edema, unspecified: Secondary | ICD-10-CM | POA: Diagnosis not present

## 2020-03-26 DIAGNOSIS — E785 Hyperlipidemia, unspecified: Secondary | ICD-10-CM | POA: Diagnosis not present

## 2020-03-26 DIAGNOSIS — I4892 Unspecified atrial flutter: Secondary | ICD-10-CM

## 2020-03-26 DIAGNOSIS — I509 Heart failure, unspecified: Secondary | ICD-10-CM | POA: Diagnosis not present

## 2020-03-26 DIAGNOSIS — Z833 Family history of diabetes mellitus: Secondary | ICD-10-CM | POA: Diagnosis not present

## 2020-03-26 DIAGNOSIS — I480 Paroxysmal atrial fibrillation: Secondary | ICD-10-CM | POA: Diagnosis present

## 2020-03-26 DIAGNOSIS — Z8249 Family history of ischemic heart disease and other diseases of the circulatory system: Secondary | ICD-10-CM | POA: Diagnosis not present

## 2020-03-26 DIAGNOSIS — E119 Type 2 diabetes mellitus without complications: Secondary | ICD-10-CM | POA: Diagnosis not present

## 2020-03-26 DIAGNOSIS — Z79899 Other long term (current) drug therapy: Secondary | ICD-10-CM | POA: Diagnosis not present

## 2020-03-26 DIAGNOSIS — I248 Other forms of acute ischemic heart disease: Secondary | ICD-10-CM | POA: Diagnosis present

## 2020-03-26 DIAGNOSIS — D649 Anemia, unspecified: Secondary | ICD-10-CM | POA: Diagnosis not present

## 2020-03-26 DIAGNOSIS — Z20822 Contact with and (suspected) exposure to covid-19: Secondary | ICD-10-CM | POA: Diagnosis not present

## 2020-03-26 DIAGNOSIS — Z7901 Long term (current) use of anticoagulants: Secondary | ICD-10-CM | POA: Diagnosis not present

## 2020-03-26 DIAGNOSIS — E876 Hypokalemia: Secondary | ICD-10-CM | POA: Diagnosis not present

## 2020-03-26 DIAGNOSIS — R0602 Shortness of breath: Secondary | ICD-10-CM | POA: Diagnosis present

## 2020-03-26 DIAGNOSIS — E0590 Thyrotoxicosis, unspecified without thyrotoxic crisis or storm: Secondary | ICD-10-CM | POA: Diagnosis not present

## 2020-03-26 DIAGNOSIS — I5031 Acute diastolic (congestive) heart failure: Secondary | ICD-10-CM | POA: Diagnosis present

## 2020-03-26 DIAGNOSIS — Z83438 Family history of other disorder of lipoprotein metabolism and other lipidemia: Secondary | ICD-10-CM | POA: Diagnosis not present

## 2020-03-26 DIAGNOSIS — I110 Hypertensive heart disease with heart failure: Secondary | ICD-10-CM | POA: Diagnosis not present

## 2020-03-26 LAB — BASIC METABOLIC PANEL
Anion gap: 7 (ref 5–15)
BUN: 9 mg/dL (ref 6–20)
CO2: 26 mmol/L (ref 22–32)
Calcium: 8.4 mg/dL — ABNORMAL LOW (ref 8.9–10.3)
Chloride: 100 mmol/L (ref 98–111)
Creatinine, Ser: 0.38 mg/dL — ABNORMAL LOW (ref 0.61–1.24)
GFR, Estimated: >60 mL/min (ref >60)
Glucose, Bld: 112 mg/dL — ABNORMAL HIGH (ref 70–99)
Potassium: 3.3 mmol/L — ABNORMAL LOW (ref 3.5–5.1)
Sodium: 133 mmol/L — ABNORMAL LOW (ref 135–145)

## 2020-03-26 LAB — ECHOCARDIOGRAM COMPLETE
Height: 68 in
S' Lateral: 3.7 cm
Single Plane A4C EF: 49.3 %
Weight: 3245.17 oz

## 2020-03-26 LAB — TROPONIN I (HIGH SENSITIVITY)
Troponin I (High Sensitivity): 16 ng/L (ref <18)
Troponin I (High Sensitivity): 17 ng/L (ref <18)
Troponin I (High Sensitivity): 19 ng/L — ABNORMAL HIGH (ref <18)
Troponin I (High Sensitivity): 21 ng/L — ABNORMAL HIGH (ref <18)

## 2020-03-26 LAB — PROTIME-INR
INR: 1.6 — ABNORMAL HIGH (ref 0.8–1.2)
Prothrombin Time: 18.8 seconds — ABNORMAL HIGH (ref 11.4–15.2)

## 2020-03-26 LAB — CBC
HCT: 33.5 % — ABNORMAL LOW (ref 39.0–52.0)
Hemoglobin: 11.2 g/dL — ABNORMAL LOW (ref 13.0–17.0)
MCH: 32.1 pg (ref 26.0–34.0)
MCHC: 33.4 g/dL (ref 30.0–36.0)
MCV: 96 fL (ref 80.0–100.0)
Platelets: 261 10*3/uL (ref 150–400)
RBC: 3.49 MIL/uL — ABNORMAL LOW (ref 4.22–5.81)
RDW: 15.3 % (ref 11.5–15.5)
WBC: 9 10*3/uL (ref 4.0–10.5)
nRBC: 0 % (ref 0.0–0.2)

## 2020-03-26 LAB — MAGNESIUM: Magnesium: 1.9 mg/dL (ref 1.7–2.4)

## 2020-03-26 LAB — BRAIN NATRIURETIC PEPTIDE: B Natriuretic Peptide: 886.5 pg/mL — ABNORMAL HIGH (ref 0.0–100.0)

## 2020-03-26 LAB — RESP PANEL BY RT-PCR (FLU A&B, COVID) ARPGX2
Influenza A by PCR: NEGATIVE
Influenza B by PCR: NEGATIVE
SARS Coronavirus 2 by RT PCR: NEGATIVE

## 2020-03-26 LAB — TSH: TSH: 0 u[IU]/mL — ABNORMAL LOW (ref 0.350–4.500)

## 2020-03-26 LAB — T4, FREE: Free T4: >5.5 ng/dL — ABNORMAL HIGH (ref 0.61–1.12)

## 2020-03-26 LAB — HEMOGLOBIN A1C
Hgb A1c MFr Bld: 5.5 % (ref 4.8–5.6)
Mean Plasma Glucose: 111.15 mg/dL

## 2020-03-26 LAB — HIV ANTIBODY (ROUTINE TESTING W REFLEX): HIV Screen 4th Generation wRfx: NONREACTIVE

## 2020-03-26 MED ORDER — PROPRANOLOL HCL 20 MG PO TABS
20.0000 mg | ORAL_TABLET | Freq: Three times a day (TID) | ORAL | Status: DC
  Administered 2020-03-26 – 2020-03-28 (×6): 20 mg via ORAL
  Filled 2020-03-26 (×6): qty 1

## 2020-03-26 MED ORDER — POTASSIUM CHLORIDE CRYS ER 20 MEQ PO TBCR
40.0000 meq | EXTENDED_RELEASE_TABLET | Freq: Once | ORAL | Status: AC
  Administered 2020-03-26: 40 meq via ORAL
  Filled 2020-03-26: qty 2

## 2020-03-26 MED ORDER — METHIMAZOLE 10 MG PO TABS
20.0000 mg | ORAL_TABLET | Freq: Every day | ORAL | Status: DC
  Administered 2020-03-26: 20 mg via ORAL
  Filled 2020-03-26 (×2): qty 2

## 2020-03-26 MED ORDER — DILTIAZEM HCL 25 MG/5ML IV SOLN
10.0000 mg | Freq: Once | INTRAVENOUS | Status: AC
  Administered 2020-03-26: 10 mg via INTRAVENOUS
  Filled 2020-03-26: qty 5

## 2020-03-26 MED ORDER — DILTIAZEM HCL-DEXTROSE 125-5 MG/125ML-% IV SOLN (PREMIX)
5.0000 mg/h | INTRAVENOUS | Status: DC
  Administered 2020-03-26: 5 mg/h via INTRAVENOUS
  Administered 2020-03-26: 10 mg/h via INTRAVENOUS
  Administered 2020-03-26: 15 mg/h via INTRAVENOUS
  Filled 2020-03-26 (×3): qty 125

## 2020-03-26 MED ORDER — FLECAINIDE ACETATE 50 MG PO TABS
100.0000 mg | ORAL_TABLET | Freq: Two times a day (BID) | ORAL | Status: DC
  Administered 2020-03-26 – 2020-03-28 (×5): 100 mg via ORAL
  Filled 2020-03-26: qty 1
  Filled 2020-03-26 (×4): qty 2
  Filled 2020-03-26: qty 1

## 2020-03-26 MED ORDER — ACETAMINOPHEN 650 MG RE SUPP: 650.0000 mg | Freq: Four times a day (QID) | RECTAL | Status: DC | PRN

## 2020-03-26 MED ORDER — ONDANSETRON HCL 4 MG/2ML IJ SOLN
4.0000 mg | Freq: Four times a day (QID) | INTRAMUSCULAR | Status: DC | PRN
  Administered 2020-03-26: 4 mg via INTRAVENOUS
  Filled 2020-03-26: qty 2

## 2020-03-26 MED ORDER — PERFLUTREN LIPID MICROSPHERE
1.0000 mL | INTRAVENOUS | Status: AC | PRN
  Administered 2020-03-26: 2 mL via INTRAVENOUS
  Filled 2020-03-26: qty 10

## 2020-03-26 MED ORDER — APIXABAN 5 MG PO TABS
5.0000 mg | ORAL_TABLET | Freq: Two times a day (BID) | ORAL | Status: DC
  Administered 2020-03-26 – 2020-03-28 (×5): 5 mg via ORAL
  Filled 2020-03-26 (×5): qty 1

## 2020-03-26 MED ORDER — ACETAMINOPHEN 325 MG PO TABS: 650.0000 mg | ORAL_TABLET | Freq: Four times a day (QID) | ORAL | Status: DC | PRN

## 2020-03-26 MED ORDER — FLECAINIDE ACETATE 50 MG PO TABS
50.0000 mg | ORAL_TABLET | Freq: Two times a day (BID) | ORAL | Status: DC
  Filled 2020-03-26: qty 1

## 2020-03-26 MED ORDER — POTASSIUM CHLORIDE CRYS ER 10 MEQ PO TBCR
10.0000 meq | EXTENDED_RELEASE_TABLET | Freq: Every day | ORAL | Status: DC
  Administered 2020-03-26 – 2020-03-28 (×3): 10 meq via ORAL
  Filled 2020-03-26 (×3): qty 1

## 2020-03-26 MED ORDER — DILTIAZEM HCL ER COATED BEADS 180 MG PO CP24
180.0000 mg | ORAL_CAPSULE | Freq: Every day | ORAL | Status: DC
  Administered 2020-03-26 – 2020-03-28 (×3): 180 mg via ORAL
  Filled 2020-03-26 (×3): qty 1

## 2020-03-26 MED ORDER — FUROSEMIDE 10 MG/ML IJ SOLN
40.0000 mg | Freq: Two times a day (BID) | INTRAMUSCULAR | Status: DC
  Administered 2020-03-26 – 2020-03-27 (×3): 40 mg via INTRAVENOUS
  Filled 2020-03-26 (×3): qty 4

## 2020-03-26 NOTE — Progress Notes (Signed)
Bilateral lower extremity venous study completed.      Please see CV Proc for preliminary results.   Mailee Klaas, RVT  

## 2020-03-26 NOTE — Care Plan (Signed)
This 50 years old male with PMH significant for atrial flutter status post ablation in February 2021, who had recurrence of A. fib last month and was seen by cardiology and was started on Cardizem, Eliquis and flecainide.  Patient started experiencing palpitations  associated with chest tightness, orthopnea and bilateral lower extremity edema.  Patient had follow-up appointment with cardiologist yesterday and the plan was to schedule a stress test and venous duplex of lower extremities.  Patient Lasix dose was increased.  Patient presented in the ED later in the day with worsening symptoms,  found to have atrial flutter with RVR.  Patient was started on Cardizem drip.  Cardiology consulted.  Heart rate is controlled after flecainide was increased to 200 twice daily.  Plan was cardioversion in the morning but TSH found to be undetectable.  At this time cardiologist recommended hyperthyroidism management.  Patient was seen and examined in the ED.  Patient is lying comfortably,  denies any chest pain.

## 2020-03-26 NOTE — H&P (Signed)
History and Physical    Kevin CairoSidney R Haislip ZDG:[id number] DOB: July 25, 1970 DOA: 03/25/2020  PCP: Doreene Nestlark, Katherine K, NP  Patient coming from: Home.  Chief Complaint: Palpitations.  HPI: Kevin Branch is a 50 y.o. male with history of atrial flutter status post ablation in February 2021 who had recurrence of the A. fib last month was seen by cardiology clinic and was started on Cardizem Eliquis and flecainide started experiencing palpitations as last night around 8 PM.  Has some chest tightness.  Also has been experiencing increasing shortness of breath and orthopnea with bilateral lower extremity edema over the last few weeks.  Patient had followed up with patient's cardiologist yesterday at [facility] cardiology and plan was to have stress test and Dopplers of the lower extremity.  Patient's Lasix dose was recently increased from 20 mg daily to 40 mg daily.  ED Course: In the ER patient is found to be in atrial flutter with RVR started on Cardizem infusion of her Cardizem bolus.  ER physician discussed with cardiologist on-call for [facility] cardiology who recommended continue with Cardizem infusion and they will be seeing patient in consult.  On exam patient has significant bilateral lower extremity edema and also patient states he gets short of breath with exertion.  Chest x-ray was unremarkable Covid test is negative.  Patient admitted for atrial flutter with RVR with acute CHF.  Labs show hemoglobin of 11.2 potassium 3.3.  High sensitive troponin was 21 and 19.  Review of Systems: As per HPI, rest all negative.   Past Medical History:  Diagnosis Date  . Essential hypertension   . Hyperglycemia   . Hyperlipidemia   . Overweight   . Typical atrial flutter [facility](HCC)     Past Surgical History:  Procedure Laterality Date  . A-FLUTTER ABLATION N/A 03/03/2019   Procedure: A-FLUTTER ABLATION;  Surgeon: Hillis RangeAllred, James, MD;  Location: [facility] INVASIVE CV LAB;  Service: Cardiovascular;  Laterality: N/A;  .  none       reports that he has been smoking cigars. He has quit using smokeless tobacco.  His smokeless tobacco use included snuff. He reports previous alcohol use. He reports that he does not use drugs.  No Known Allergies  Family History  Problem Relation Age of Onset  . Hyperlipidemia Mother   . Heart disease Mother   . Diabetes Mother   . Hyperlipidemia Father   . Hypertension Sister   . Hyperlipidemia Sister     Prior to Admission medications   Medication Sig Start Date End Date Taking? Authorizing Provider  acetaminophen (TYLENOL) 325 MG tablet Take 650 mg by mouth every 6 (six) hours as needed for mild pain.   Yes [provider]  apixaban (ELIQUIS) 5 MG TABS tablet Take 1 tablet (5 mg total) by mouth 2 (two) times daily. 03/05/20  Yes Graciella Freerillery, Michael Andrew, PA-C  calcium carbonate (TUMS - DOSED IN MG ELEMENTAL CALCIUM) 500 MG chewable tablet Chew 2 tablets by mouth daily as needed for indigestion or heartburn.   Yes [provider]  diltiazem (CARDIZEM CD) 180 MG 24 hr capsule Take 1 capsule (180 mg total) by mouth daily. 03/14/20  Yes Fenton, Clint R, PA  diltiazem (CARDIZEM) 30 MG tablet Take 1 tablet (30 mg total) by mouth 3 (three) times daily as needed (for palpitations with heart rate > 110 despite taking the extended release medicine). 03/05/20  Yes Graciella Freerillery, Michael Andrew, PA-C  flecainide (TAMBOCOR) 50 MG tablet Take 1 tablet (50 mg total) by  mouth 2 (two) times daily. 03/14/20  Yes Fenton, Clint R, PA  furosemide (LASIX) 20 MG tablet Take 2 tablets (40 mg total) by mouth daily. May take extra tablet daily for weight gain/swelling Patient taking differently: Take 20 mg by mouth 2 (two) times daily. May take extra tablet daily for weight gain/swelling 03/25/20  Yes Cantwell, Celeste C, PA-C  Multiple Vitamins-Minerals (ONE-A-DAY VITACRAVES ADULT PO) Take 1 tablet by mouth daily.   Yes [provider]  potassium chloride (KLOR-CON) 10 MEQ tablet Take 1  tablet (10 mEq total) by mouth daily. 03/18/20 06/16/20 Yes Fenton, Clint R, PA    Physical Exam: Constitutional: Moderately built and nourished. Vitals:   03/26/20 0500 03/26/20 0530 03/26/20 0545 03/26/20 0600  BP: 128/62 (!) 107/54 136/76 (!) 123/56  Pulse: (!) 115 (!) 131 (!) 130 (!) 127  Resp: (!) 25 (!) 25 (!) 25 (!) 26  Temp:      TempSrc:      SpO2: 97% 94% 97% 95%  Weight:      Height:       Eyes: Anicteric no pallor. ENMT: No discharge from the ears eyes nose or mouth. Neck: JVD elevated no mass felt. Respiratory: No rhonchi or crepitations. Cardiovascular: S1-S2 heard. Abdomen: Soft nontender bowel sounds present. Musculoskeletal: Bilateral lower extremity edema present. Skin: No rash. Neurologic: Alert awake oriented to time place and person.  Moves all extremities. Psychiatric: Appears normal.  Normal affect.   Labs on Admission: I have personally reviewed following labs and imaging studies  CBC: Recent Labs  Lab 03/25/20 2335  WBC 9.0  HGB 11.2*  HCT 33.5*  MCV 96.0  PLT 261   Basic Metabolic Panel: Recent Labs  Lab 03/25/20 2335  NA 133*  K 3.3*  CL 100  CO2 26  GLUCOSE 112*  BUN 9  CREATININE 0.38*  CALCIUM 8.4*   GFR: Estimated Creatinine Clearance: 121.6 mL/min (A) (by C-G formula based on SCr of 0.38 mg/dL (L)). Liver Function Tests: No results for input(s): AST, ALT, ALKPHOS, BILITOT, PROT, ALBUMIN in the last 168 hours. No results for input(s): LIPASE, AMYLASE in the last 168 hours. No results for input(s): AMMONIA in the last 168 hours. Coagulation Profile: Recent Labs  Lab 03/25/20 2335  INR 1.6*   Cardiac Enzymes: No results for input(s): CKTOTAL, CKMB, CKMBINDEX, TROPONINI in the last 168 hours. BNP (last 3 results) No results for input(s): PROBNP in the last 8760 hours. HbA1C: No results for input(s): HGBA1C in the last 72 hours. CBG: No results for input(s): GLUCAP in the last 168 hours. Lipid Profile: No results for  input(s): CHOL, HDL, LDLCALC, TRIG, CHOLHDL, LDLDIRECT in the last 72 hours. Thyroid Function Tests: No results for input(s): TSH, T4TOTAL, FREET4, T3FREE, THYROIDAB in the last 72 hours. Anemia Panel: No results for input(s): VITAMINB12, FOLATE, FERRITIN, TIBC, IRON, RETICCTPCT in the last 72 hours. Urine analysis: No results found for: COLORURINE, APPEARANCEUR, LABSPEC, PHURINE, GLUCOSEU, HGBUR, BILIRUBINUR, KETONESUR, PROTEINUR, UROBILINOGEN, NITRITE, LEUKOCYTESUR Sepsis Labs: @LABRCNTIP (procalcitonin:4,lacticidven:4) ) Recent Results (from the past 240 hour(s))  Resp Panel by RT-PCR (Flu A&B, Covid) Nasopharyngeal Swab     Status: None   Collection Time: 03/26/20  4:36 AM   Specimen: Nasopharyngeal Swab; Nasopharyngeal(NP) swabs in vial transport medium  Result Value Ref Range Status   SARS Coronavirus 2 by RT PCR NEGATIVE NEGATIVE Final    Comment: (NOTE) SARS-CoV-2 target nucleic acids are NOT DETECTED.  The SARS-CoV-2 RNA is generally detectable in upper respiratory specimens during the  acute phase of infection. The lowest concentration of SARS-CoV-2 viral copies this assay can detect is 138 copies/mL. A negative result does not preclude SARS-Cov-2 infection and should not be used as the sole basis for treatment or other patient management decisions. A negative result may occur with  improper specimen collection/handling, submission of specimen other than nasopharyngeal swab, presence of viral mutation(s) within the areas targeted by this assay, and inadequate number of viral copies(<138 copies/mL). A negative result must be combined with clinical observations, patient history, and epidemiological information. The expected result is Negative.  Fact Sheet for Patients:  BloggerCourse.com  Fact Sheet for Healthcare Providers:  SeriousBroker.it  This test is no t yet approved or cleared by the [place] FDA and  has been  authorized for detection and/or diagnosis of SARS-CoV-2 by FDA under an Emergency Use Authorization (EUA). This EUA will remain  in effect (meaning this test can be used) for the duration of the COVID-19 declaration under Section 564(b)(1) of the Act, 21 U.S.C.section 360bbb-3(b)(1), unless the authorization is terminated  or revoked sooner.       Influenza A by PCR NEGATIVE NEGATIVE Final   Influenza B by PCR NEGATIVE NEGATIVE Final    Comment: (NOTE) The Xpert Xpress SARS-CoV-2/FLU/RSV plus assay is intended as an aid in the diagnosis of influenza from Nasopharyngeal swab specimens and should not be used as a sole basis for treatment. Nasal washings and aspirates are unacceptable for Xpert Xpress SARS-CoV-2/FLU/RSV testing.  Fact Sheet for Patients: BloggerCourse.com  Fact Sheet for Healthcare Providers: SeriousBroker.it  This test is not yet approved or cleared by the [place] FDA and has been authorized for detection and/or diagnosis of SARS-CoV-2 by FDA under an Emergency Use Authorization (EUA). This EUA will remain in effect (meaning this test can be used) for the duration of the COVID-19 declaration under Section 564(b)(1) of the Act, 21 U.S.C. section 360bbb-3(b)(1), unless the authorization is terminated or revoked.  Performed at [facility], [address] [place], [place] [address]      Radiological Exams on Admission: DG Chest 2 View  Result Date: 03/25/2020 CLINICAL DATA:  Chest tightness and shortness of breath with palpitations. EXAM: CHEST - 2 VIEW COMPARISON:  March 05, 2020 FINDINGS: The heart size and mediastinal contours are within normal limits. Low lung volumes are noted. Both lungs are clear. The visualized skeletal structures are unremarkable. IMPRESSION: No active cardiopulmonary disease. Electronically Signed   By: Aram Candela M.D.   On: 03/25/2020 23:45    EKG: Independently  reviewed.  Atrial flutter with RVR.  Assessment/Plan Principal Problem:   Atrial flutter with rapid ventricular response (HCC) Active Problems:   Essential hypertension   Acute CHF (congestive heart failure) (HCC)    1. Atrial flutter with RVR presently on Cardizem infusion.  Dr. Odis Hollingshead of [facility] cardiology has been notified.  We will continue patient's home dose of flecainide Cardizem p.o. Eliquis check thyroid function test. 2. Decompensated CHF unknown EF will check 2D echo.  We will place patient on Lasix 40 mg IV every 12.  Follow intake output metabolic panel daily weights.  Likely decompensated from a flutter with RVR.  Since patient has significant bilateral lower extremity edema will check Dopplers. 3. Hypertension on Cardizem. 4. Mild hypokalemia replace and recheck.  Check magnesium levels. 5. Anemia -follow CBC. 6. Diabetes mellitus type 2 is mentioned in the chart.  Patient not on any medications.  Will check hemoglobin A1c per  Since patient has  atrial flutter with RVR and also decompensated CHF will need inpatient status.   DVT prophylaxis: Apixaban. Code Status: Full code. Family Communication: Discussed with patient. Disposition Plan: Home. Consults called: ER physician discussed with cardiologist. Admission status: Inpatient.   Eduard Clos MD [facility] Pager [phone].  If 7PM-7AM, please contact night-coverage [email or web address] Password [facility]  03/26/2020, 6:15 AM

## 2020-03-26 NOTE — Telephone Encounter (Signed)
Yes, thank you for sending this along. MP has seen him in the ER today and we are following along. May end of rescheduling stress test. I will leave that to MP to decide when patient is d/c from hosptial. Thank you

## 2020-03-26 NOTE — Progress Notes (Signed)
  Echocardiogram 2D Echocardiogram has been performed.  Stark Bray Swaim 03/26/2020, 11:17 AM

## 2020-03-26 NOTE — Progress Notes (Signed)
Heart Failure Stewardship Pharmacist Progress Note   PCP: Doreene Nest, NP PCP-Cardiologist: No primary care provider on file.    HPI:  50 yo M with PMH of atrial flutter, HTN, HLD, and obesity. He presented to the ED on 03/25/20 with palpitations, chest tightness, shortness of breath, LE edema, and orthopnea. An ECHO was done on 03/26/20 and LVEF was 50-55%. Cardioversion planned for 3/16 AM.  Current HF Medications:  Furosemide 40 mg IV BID  Prior to admission HF Medications: Furosemide 20 mg BID  Pertinent Lab Values: . Serum creatinine 0.38, BUN 9, Potassium 3.3, Sodium 133, BNP 886.5, Magnesium 1.9   Vital Signs: . Weight: 202 lbs (admission weight: 202 lbs) . Blood pressure: 110-120/60s  . Heart rate: 80s   Medication Assistance / Insurance Benefits Check: Does the patient have prescription insurance?  Yes Type of insurance plan: [employer] - commercial insurance  Outpatient Pharmacy:  Prior to admission [facility] Is the patient willing to use [facility] TOC pharmacy at discharge? Yes Is the patient willing to transition their outpatient pharmacy to utilize a [facility] outpatient pharmacy?   Pending    Assessment: 1. Acute diastolic CHF (EF 03-47%), due to unknown etiology. Stress test not yet completed, also with afib. NYHA class III symptoms. - Continue furosemide 40 mg IV BID. Potassium replaced.  - Consider starting Entresto 24/26 mg BID for new indication of LVEF <57% - Consider starting Farxiga or Jardiance 10 mg daily for HFpEF   Plan: 1) Medication changes recommended at this time: - Start Entresto 24/26 mg BID  2) Patient assistance: - Entresto copay $200 per month - can enroll in monthly copay card to lower to $10 per 1-3 month supply - Jardiance copay $200 per month - can enroll in monthly copay card to lower to $10 per 1-3 month supply - Farxiga copay $75 per month - can enroll in monthly copay card to lower to $0 per month  3)   Education  - To be completed prior to discharge  Sharen Hones, PharmD, BCPS Heart Failure Stewardship Pharmacist Phone [phone]

## 2020-03-26 NOTE — ED Notes (Signed)
Pt transported to vascular.  °

## 2020-03-26 NOTE — ED Provider Notes (Signed)
[facility] EMERGENCY DEPARTMENT Provider Note   CSN: [id number] Arrival date & time: 03/25/20  2304     History Chief Complaint  Patient presents with  . Chest Pain    Afib/SOB    Kevin Branch is a 50 y.o. male.  The history is provided by the patient.  Palpitations Palpitations quality:  Fast Onset quality:  Sudden Duration:  4 hours Timing:  Constant Progression:  Worsening Chronicity:  New Relieved by:  None tried Worsened by:  Nothing Associated symptoms: chest pain, lower extremity edema, shortness of breath and vomiting   Associated symptoms: no syncope   Patient with history of hypertension, hyperlipidemia, atrial flutter presents with palpitations.  Patient reports at approximately 8:30 PM his heart started racing.  He checked his monitor and he appeared to be in atrial fib. He has been taking his anticoagulation regularly.  He did not take his flecainide He reports brief chest pain and fast heart rate.  He also reports shortness of breath.  He reports recent orthopnea and increased lower extremity edema. He has been on anticoagulation since around February 22.  He has had previous ablations and cardioversions previously     Past Medical History:  Diagnosis Date  . Essential hypertension   . Hyperglycemia   . Hyperlipidemia   . Overweight   . Typical atrial flutter [facility])     Patient Active Problem List   Diagnosis Date Noted  . Paroxysmal atrial fibrillation (HCC) 03/14/2020  . Secondary hypercoagulable state (HCC) 03/14/2020  . Screening cholesterol level 02/22/2019  . Typical atrial flutter (HCC) 02/22/2019  . Hyperlipidemia 07/03/2016  . Essential hypertension 04/08/2016  . Type 2 diabetes mellitus (HCC) 04/08/2016    Past Surgical History:  Procedure Laterality Date  . A-FLUTTER ABLATION N/A 03/03/2019   Procedure: A-FLUTTER ABLATION;  Surgeon: Hillis Range, MD;  Location: [facility] INVASIVE CV LAB;  Service: Cardiovascular;   Laterality: N/A;  . none         Family History  Problem Relation Age of Onset  . Hyperlipidemia Mother   . Heart disease Mother   . Diabetes Mother   . Hyperlipidemia Father   . Hypertension Sister   . Hyperlipidemia Sister     Social History   Tobacco Use  . Smoking status: Current Some Day Smoker    Types: Cigars  . Smokeless tobacco: Former Neurosurgeon    Types: Snuff  . Tobacco comment: once a year  Vaping Use  . Vaping Use: Never used  Substance Use Topics  . Alcohol use: Not Currently  . Drug use: Never    Home Medications Prior to Admission medications   Medication Sig Start Date End Date Taking? Authorizing Provider  acetaminophen (TYLENOL) 325 MG tablet Take 650 mg by mouth every 6 (six) hours as needed.    [provider]  apixaban (ELIQUIS) 5 MG TABS tablet Take 1 tablet (5 mg total) by mouth 2 (two) times daily. 03/05/20   Graciella Freer, PA-C  ascorbic acid (VITAMIN C) 500 MG tablet Take 500 mg by mouth daily.    [provider]  calcium carbonate (TUMS - DOSED IN MG ELEMENTAL CALCIUM) 500 MG chewable tablet Chew 2 tablets by mouth daily as needed for indigestion or heartburn.    [provider]  diltiazem (CARDIZEM CD) 180 MG 24 hr capsule Take 1 capsule (180 mg total) by mouth daily. 03/14/20   Fenton, Clint R, PA  diltiazem (CARDIZEM) 30 MG tablet Take 1 tablet (  30 mg total) by mouth 3 (three) times daily as needed (for palpitations with heart rate > 110 despite taking the extended release medicine). 03/05/20   Graciella Freer, PA-C  flecainide (TAMBOCOR) 50 MG tablet Take 1 tablet (50 mg total) by mouth 2 (two) times daily. 03/14/20   Fenton, Clint R, PA  furosemide (LASIX) 20 MG tablet Take 2 tablets (40 mg total) by mouth daily. May take extra tablet daily for weight gain/swelling 03/25/20   Cantwell, Celeste C, PA-C  Multiple Vitamins-Minerals (ONE-A-DAY VITACRAVES ADULT PO) Take 1 tablet by mouth daily.    [provider]  Omega-3 Fatty Acids (FISH OIL ADULT GUMMIES PO) Take by mouth.    [provider]  potassium chloride (KLOR-CON) 10 MEQ tablet Take 1 tablet (10 mEq total) by mouth daily. 03/18/20 06/16/20  Fenton, Clint R, PA    Allergies    Patient has no known allergies.  Review of Systems   Review of Systems  Constitutional: Negative for fever.  Respiratory: Positive for shortness of breath.   Cardiovascular: Positive for chest pain, palpitations and leg swelling. Negative for syncope.  Gastrointestinal: Positive for vomiting. Negative for abdominal pain.  All other systems reviewed and are negative.   Physical Exam Updated Vital Signs BP 122/70   Pulse (!) 125   Temp 98.2 F (36.8 C) (Oral)   Resp (!) 26   Ht 1.727 m (5\' 8" )   Wt 92 kg   SpO2 95%   BMI 30.84 kg/m   Physical Exam CONSTITUTIONAL: Well developed/well nourished HEAD: Normocephalic/atraumatic EYES: EOMI/PERRL ENMT: Mucous membranes moist NECK: supple no meningeal signs SPINE/BACK:entire spine nontender CV: Tachycardic and irregular LUNGS: Lungs are clear to auscultation bilaterally, no apparent distress ABDOMEN: soft, nontender, no rebound or guarding, bowel sounds noted throughout abdomen GU:no cva tenderness NEURO: Pt is awake/alert/appropriate, moves all extremitiesx4.  No facial droop.   EXTREMITIES: pulses normal/equal, full ROM, lower extremity edema noted SKIN: warm, color normal PSYCH: Mildly anxious  ED Results / Procedures / Treatments   Labs (all labs ordered are listed, but only abnormal results are displayed) Labs Reviewed  BASIC METABOLIC PANEL - Abnormal; Notable for the following components:      Result Value   Sodium 133 (*)    Potassium 3.3 (*)    Glucose, Bld 112 (*)    Creatinine, Ser 0.38 (*)    Calcium 8.4 (*)    All other components within normal limits  CBC - Abnormal; Notable for the following components:   RBC 3.49 (*)    Hemoglobin 11.2 (*)    HCT 33.5 (*)     All other components within normal limits  PROTIME-INR - Abnormal; Notable for the following components:   Prothrombin Time 18.8 (*)    INR 1.6 (*)    All other components within normal limits  TROPONIN I (HIGH SENSITIVITY) - Abnormal; Notable for the following components:   Troponin I (High Sensitivity) 21 (*)    All other components within normal limits  TROPONIN I (HIGH SENSITIVITY)    EKG EKG Interpretation  Date/Time:  Monday March 25 2020 23:13:00 EDT Ventricular Rate:  136 PR Interval:    QRS Duration: 82 QT Interval:  276 QTC Calculation: 415 R Axis:   59 Text Interpretation: Atrial flutter with variable A-V block ST & T wave abnormality, consider lateral ischemia Abnormal ECG Confirmed by [id number] (Zadie Rhine) on 03/26/2020 1:38:40 AM   Radiology DG Chest 2 View  Result Date: 03/25/2020 CLINICAL  DATA:  Chest tightness and shortness of breath with palpitations. EXAM: CHEST - 2 VIEW COMPARISON:  March 05, 2020 FINDINGS: The heart size and mediastinal contours are within normal limits. Low lung volumes are noted. Both lungs are clear. The visualized skeletal structures are unremarkable. IMPRESSION: No active cardiopulmonary disease. Electronically Signed   By: Aram Candela M.D.   On: 03/25/2020 23:45    Procedures .Critical Care Performed by: Zadie Rhine, MD Authorized by: Zadie Rhine, MD   Critical care provider statement:    Critical care time (minutes):  45   Critical care start time:  03/26/2020 3:15 AM   Critical care end time:  03/26/2020 4:00 AM   Critical care time was exclusive of:  Separately billable procedures and treating other patients   Critical care was necessary to treat or prevent imminent or life-threatening deterioration of the following conditions:  Cardiac failure and circulatory failure   Critical care was time spent personally by me on the following activities:  Development of treatment plan with patient or surrogate,  discussions with consultants, evaluation of patient's response to treatment, examination of patient, pulse oximetry, ordering and review of radiographic studies, ordering and review of laboratory studies, ordering and performing treatments and interventions, re-evaluation of patient's condition and review of old charts   I assumed direction of critical care for this patient from another provider in my specialty: no     Care discussed with: admitting provider       Medications Ordered in ED Medications  diltiazem (CARDIZEM) 125 mg in dextrose 5% 125 mL (1 mg/mL) infusion (has no administration in time range)  diltiazem (CARDIZEM) injection 10 mg (has no administration in time range)    ED Course  I have reviewed the triage vital signs and the nursing notes.  Pertinent labs & imaging results that were available during my care of the patient were reviewed by me and considered in my medical decision making (see chart for details).    MDM Rules/Calculators/A&P                          3:10 AM Patient was just seen in the cardiology office on March 14.  At that time he was noted to be in sinus rhythm.  Patient shows me that his home monitoring reveals sinus rhythm until the evening when he went into atrial flutter.  This episode started around 8:30 PM.  He reports being on anticoagulation since February 22 which is approximately 3 weeks ago Consult cardiology 4:18 AM Discussed the case with Dr. Odis Hollingshead with cardiology. Patient with a complicated history and was already advised to have an outpatient echocardiogram. Cardiology recommendations are to admit for rate control with Cardizem, with [facility] cardiology consultation later in the morning.  Patient also likely need to have echocardiogram due to recent orthopnea as well as peripheral edema.  Discussed with Dr. Toniann Fail for admission Final Clinical Impression(s) / ED Diagnoses Final diagnoses:  Atrial flutter with rapid ventricular  response [facility])    Rx / DC Orders ED Discharge Orders    None       Zadie Rhine, MD 03/26/20 [phone]

## 2020-03-26 NOTE — Consult Note (Signed)
CARDIOLOGY CONSULT NOTE  Patient ID: Basilio CairoSidney R Yuille MRN: [id number] DOB/AGE: 51-10-1970 51 y.o.  Admit date: 03/25/2020 [facility] Reason for Consultation:  Afib  HPI:   50 y.o. Caucasian male with hypertension, h/o atrial flutter-treated with ablation by Dr Johney FrameAllred (02/2019), recent paroxysmal Afib, hypertension, prediabetes, now admitted with shortness of breath.  Patient has had exertional dyspnea, b/l leg edema, orthopnea for past few days. He did have brief right sided chest pain during episode of RVR, while in the hospital. Trop has been low and flat. US showed no DVT. Echcoardiogram showed EF 50-55%. Patient has diuresed 1.6 L with IV lasix and feels better, but still has leg edema. Rate is in 80s with diltiazem drip at 15 mcg/hr. TSH is 0.000  He was started on eliquis by Dr Johney FrameAllred on 03/05/2020 given recurrent Afib. However, he did not need cardioversion as he converted back to sinus rhythm, He went back into Afib on 03/19/20.    Past Medical History:  Diagnosis Date  . Essential hypertension   . Hyperglycemia   . Hyperlipidemia   . Overweight   . Typical atrial flutter [facility](HCC)      Past Surgical History:  Procedure Laterality Date  . A-FLUTTER ABLATION N/A 03/03/2019   Procedure: A-FLUTTER ABLATION;  Surgeon: Hillis RangeAllred, James, MD;  Location: [facility] INVASIVE CV LAB;  Service: Cardiovascular;  Laterality: N/A;  . none        Family History  Problem Relation Age of Onset  . Hyperlipidemia Mother   . Heart disease Mother   . Diabetes Mother   . Hyperlipidemia Father   . Hypertension Sister   . Hyperlipidemia Sister      Social History: Social History   Socioeconomic History  . Marital status: Married    Spouse name: Not on file  . Number of children: 6  . Years of education: Not on file  . Highest education level: Not on file  Occupational History  . Not on file  Tobacco Use  . Smoking status: Current Some Day Smoker    Types:  Cigars  . Smokeless tobacco: Former NeurosurgeonUser    Types: Snuff  . Tobacco comment: once a year  Vaping Use  . Vaping Use: Never used  Substance and Sexual Activity  . Alcohol use: Not Currently  . Drug use: Never  . Sexual activity: Not on file  Other Topics Concern  . Not on file  Social History Narrative   Married.   6 children.   Works for [employer].   [place] in [place].   Enjoys hunting and fishing.    Social Determinants of Health   Financial Resource Strain: Not on file  Food Insecurity: Not on file  Transportation Needs: Not on file  Physical Activity: Not on file  Stress: Not on file  Social Connections: Not on file  Intimate Partner Violence: Not on file     Medications Prior to Admission  Medication Sig Dispense Refill Last Dose  . acetaminophen (TYLENOL) 325 MG tablet Take 650 mg by mouth every 6 (six) hours as needed for mild pain.   03/25/2020 at Unknown time  . apixaban (ELIQUIS) 5 MG TABS tablet Take 1 tablet (5 mg total) by mouth 2 (two) times daily. 60 tablet 1 03/25/2020 at 1900  . calcium carbonate (TUMS - DOSED IN MG ELEMENTAL CALCIUM) 500 MG chewable tablet Chew 2 tablets by mouth daily as needed for indigestion or heartburn.   unk  .  diltiazem (CARDIZEM CD) 180 MG 24 hr capsule Take 1 capsule (180 mg total) by mouth daily. 60 capsule 6 03/25/2020 at Unknown time  . diltiazem (CARDIZEM) 30 MG tablet Take 1 tablet (30 mg total) by mouth 3 (three) times daily as needed (for palpitations with heart rate > 110 despite taking the extended release medicine). 30 tablet 3 unk  . flecainide (TAMBOCOR) 50 MG tablet Take 1 tablet (50 mg total) by mouth 2 (two) times daily. 60 tablet 3 03/25/2020 at Unknown time  . furosemide (LASIX) 20 MG tablet Take 2 tablets (40 mg total) by mouth daily. May take extra tablet daily for weight gain/swelling (Patient taking differently: Take 20 mg by mouth 2 (two) times daily. May take extra tablet daily for weight  gain/swelling) 40 tablet 1 03/25/2020 at Unknown time  . Multiple Vitamins-Minerals (ONE-A-DAY VITACRAVES ADULT PO) Take 1 tablet by mouth daily.   03/25/2020 at Unknown time  . potassium chloride (KLOR-CON) 10 MEQ tablet Take 1 tablet (10 mEq total) by mouth daily. 30 tablet 3 Past Month at Unknown time    Review of Systems  Constitutional: Negative for decreased appetite, malaise/fatigue, weight gain and weight loss.  HENT: Negative for congestion.   Eyes: Negative for visual disturbance.  Cardiovascular: Positive for chest pain, dyspnea on exertion, leg swelling and palpitations. Negative for syncope.  Respiratory: Negative for cough.   Endocrine: Negative for cold intolerance.  Hematologic/Lymphatic: Does not bruise/bleed easily.  Skin: Negative for itching and rash.  Musculoskeletal: Negative for myalgias.  Gastrointestinal: Negative for abdominal pain, nausea and vomiting.  Genitourinary: Negative for dysuria.  Neurological: Negative for dizziness and weakness.  Psychiatric/Behavioral: The patient is not nervous/anxious.   All other systems reviewed and are negative.     Physical Exam: Physical Exam Vitals and nursing note reviewed.  Constitutional:      General: He is not in acute distress.    Appearance: He is well-developed.  HENT:     Head: Normocephalic and atraumatic.  Eyes:     Conjunctiva/sclera: Conjunctivae normal.     Pupils: Pupils are equal, round, and reactive to light.  Neck:     Vascular: No JVD.  Cardiovascular:     Rate and Rhythm: Normal rate. Rhythm irregular.     Pulses: Normal pulses and intact distal pulses.     Heart sounds: Normal heart sounds. No murmur heard.   Pulmonary:     Effort: Pulmonary effort is normal.     Breath sounds: Normal breath sounds. No wheezing or rales.  Abdominal:     General: Bowel sounds are normal.     Palpations: Abdomen is soft.     Tenderness: There is no rebound.  Musculoskeletal:        General: No  tenderness. Normal range of motion.     Right lower leg: Edema (2+) present.     Left lower leg: Edema (2+) present.  Lymphadenopathy:     Cervical: No cervical adenopathy.  Skin:    General: Skin is warm and dry.  Neurological:     Mental Status: He is alert and oriented to person, place, and time.     Cranial Nerves: No cranial nerve deficit.      Labs:   Lab Results  Component Value Date   WBC 9.0 03/25/2020   HGB 11.2 (L) 03/25/2020   HCT 33.5 (L) 03/25/2020   MCV 96.0 03/25/2020   PLT 261 03/25/2020    Recent Labs  Lab 03/25/20 2335  NA  133*  K 3.3*  CL 100  CO2 26  BUN 9  CREATININE 0.38*  CALCIUM 8.4*  GLUCOSE 112*    Lipid Panel     Component Value Date/Time   CHOL 151 07/03/2016 0933   TRIG 70.0 07/03/2016 0933   HDL 45.20 07/03/2016 0933   CHOLHDL 3 07/03/2016 0933   VLDL 14.0 07/03/2016 0933   LDLCALC 92 07/03/2016 0933    BNP (last 3 results) Recent Labs    03/26/20 0741  BNP 886.5*    HEMOGLOBIN A1C Lab Results  Component Value Date   HGBA1C 5.5 03/26/2020   MPG 111.15 03/26/2020    Cardiac Panel (last 3 results) Results for WYETT, NARINE "RANDY" (MRN [id number]) as of 03/26/2020 15:48  Ref. Range 03/26/2020 07:40 03/26/2020 07:41 03/26/2020 08:15  B Natriuretic Peptide Latest Ref Range: 0.0 - 100.0 pg/mL  886.5 (H)   Troponin I (High Sensitivity) Latest Ref Range: <18 ng/L 17  16    TSH Recent Labs    03/26/20 0740  TSH 0.000*      Radiology: DG Chest 2 View  Result Date: 03/25/2020 CLINICAL DATA:  Chest tightness and shortness of breath with palpitations. EXAM: CHEST - 2 VIEW COMPARISON:  March 05, 2020 FINDINGS: The heart size and mediastinal contours are within normal limits. Low lung volumes are noted. Both lungs are clear. The visualized skeletal structures are unremarkable. IMPRESSION: No active cardiopulmonary disease. Electronically Signed   By: Aram Candela M.D.   On: 03/25/2020 23:45   ECHOCARDIOGRAM  COMPLETE  Result Date: 03/26/2020    ECHOCARDIOGRAM REPORT   Patient Name:   VERE DIANTONIO Date of Exam: 03/26/2020 Medical Rec #:  [id number]      Height:       68.0 in Accession #:    [id number]     Weight:       202.8 lb Date of Birth:  [birth year]      BSA:          2.056 m Patient Age:    49 years       BP:           122/70 mmHg Patient Gender: M              HR:           83 bpm. Exam Location:  Inpatient Procedure: 2D Echo, Cardiac Doppler, Color Doppler and Intracardiac            Opacification Agent Indications:    Congestive Heart Failure I50.9  History:        Patient has prior history of Echocardiogram examinations, most                 recent 03/01/2019. Arrythmias:Atrial Flutter; Risk                 Factors:Hypertension, Dyslipidemia and Current Smoker.  Sonographer:    Renella Cunas RDCS Referring Phys: [id number] Meryle Ready [facility] IMPRESSIONS  1. Left ventricular ejection fraction, by estimation, is 50 to 55%. The left ventricle has low normal function. The left ventricle has no regional wall motion abnormalities. Left ventricular diastolic function could not be evaluated.  2. Right ventricular systolic function is normal. The right ventricular size is severely enlarged. There is moderately elevated pulmonary artery systolic pressure.  3. Left atrial size was moderately dilated.  4. Right atrial size was moderately dilated.  5. The mitral valve is normal in structure. Mild to moderate mitral valve regurgitation.  6. Tricuspid valve regurgitation is mild to moderate.  7. The aortic valve is tricuspid. Aortic valve regurgitation is mild. No aortic stenosis is present. Comparison(s): Prior images unable to be directly viewed, comparison made by report only. FINDINGS  Left Ventricle: Left ventricular ejection fraction, by estimation, is 50 to 55%. The left ventricle has low normal function. The left ventricle has no regional wall motion abnormalities. Definity contrast agent was given IV to delineate the left  ventricular endocardial borders. The left ventricular internal cavity size was normal in size. There is no left ventricular hypertrophy. Left ventricular diastolic function could not be evaluated due to atrial fibrillation. Left ventricular diastolic function could not be evaluated. Right Ventricle: The right ventricular size is severely enlarged. No increase in right ventricular wall thickness. Right ventricular systolic function is normal. There is moderately elevated pulmonary artery systolic pressure. The tricuspid regurgitant velocity is 2.83 m/s, and with an assumed right atrial pressure of 15 mmHg, the estimated right ventricular systolic pressure is 47.0 mmHg. Left Atrium: Left atrial size was moderately dilated. Right Atrium: Right atrial size was moderately dilated. Pericardium: There is no evidence of pericardial effusion. Mitral Valve: The mitral valve is normal in structure. Mild to moderate mitral valve regurgitation, with centrally-directed jet. Tricuspid Valve: The tricuspid valve is normal in structure. Tricuspid valve regurgitation is mild to moderate. Aortic Valve: The aortic valve is tricuspid. Aortic valve regurgitation is mild. No aortic stenosis is present. Pulmonic Valve: The pulmonic valve was grossly normal. Pulmonic valve regurgitation is not visualized. Aorta: The aortic root is normal in size and structure. IAS/Shunts: No atrial level shunt detected by color flow Doppler.  LEFT VENTRICLE PLAX 2D LVIDd:         5.00 cm LVIDs:         3.70 cm LV PW:         1.00 cm LV IVS:        1.00 cm LVOT diam:     2.30 cm LV SV:         106 LV SV Index:   52 LVOT Area:     4.15 cm  LV Volumes (MOD) LV vol d, MOD A4C: 120.0 ml LV vol s, MOD A4C: 60.9 ml LV SV MOD A4C:     120.0 ml RIGHT VENTRICLE RV Basal diam:  5.27 cm RV S prime:     18.30 cm/s TAPSE (M-mode): 1.7 cm LEFT ATRIUM             Index       RIGHT ATRIUM           Index LA diam:        4.40 cm 2.14 cm/m  RA Area:     26.30 cm LA Vol  (A2C):   72.6 ml 35.31 ml/m RA Volume:   94.90 ml  46.16 ml/m LA Vol (A4C):   76.5 ml 37.21 ml/m LA Biplane Vol: 75.7 ml 36.82 ml/m  AORTIC VALVE LVOT Vmax:   161.00 cm/s LVOT Vmean:  110.000 cm/s LVOT VTI:    0.256 m  AORTA Ao Root diam: 3.50 cm Ao Asc diam:  3.40 cm TRICUSPID VALVE TR Peak grad:   32.0 mmHg TR Vmax:        283.00 cm/s  SHUNTS Systemic VTI:  0.26 m Systemic Diam: 2.30 cm Rachelle Hora Croitoru MD Electronically signed by Thurmon Fair MD Signature Date/Time: 03/26/2020/11:18:58 AM    Final    VAS [place] LOWER EXTREMITY VENOUS (DVT)  Result Date:  03/26/2020  Lower Venous DVT Study Indications: Edema. Other Indications: Possible CHF. Risk Factors: A-fib None identified. Anticoagulation: Eliquis. Comparison Study: No previous Performing Technologist: Clint Guy RVT  Examination Guidelines: A complete evaluation includes B-mode imaging, spectral Doppler, color Doppler, and power Doppler as needed of all accessible portions of each vessel. Bilateral testing is considered an integral part of a complete examination. Limited examinations for reoccurring indications may be performed as noted. The reflux portion of the exam is performed with the patient in reverse Trendelenburg.  +---------+---------------+---------+-----------+----------+--------------+ RIGHT    CompressibilityPhasicitySpontaneityPropertiesThrombus Aging +---------+---------------+---------+-----------+----------+--------------+ CFV      Full                                                        +---------+---------------+---------+-----------+----------+--------------+ SFJ      Full                                                        +---------+---------------+---------+-----------+----------+--------------+ FV Prox  Full                                                        +---------+---------------+---------+-----------+----------+--------------+ FV Mid   Full                                                         +---------+---------------+---------+-----------+----------+--------------+ FV DistalFull                                                        +---------+---------------+---------+-----------+----------+--------------+ PFV      Full                                                        +---------+---------------+---------+-----------+----------+--------------+ POP      Full                                                        +---------+---------------+---------+-----------+----------+--------------+ PTV      Full                                                        +---------+---------------+---------+-----------+----------+--------------+ PERO     Full                                                        +---------+---------------+---------+-----------+----------+--------------+   +---------+---------------+---------+-----------+----------+--------------+  LEFT     CompressibilityPhasicitySpontaneityPropertiesThrombus Aging +---------+---------------+---------+-----------+----------+--------------+ CFV      Full                                                        +---------+---------------+---------+-----------+----------+--------------+ SFJ      Full                                                        +---------+---------------+---------+-----------+----------+--------------+ FV Prox  Full                                                        +---------+---------------+---------+-----------+----------+--------------+ FV Mid   Full                                                        +---------+---------------+---------+-----------+----------+--------------+ FV DistalFull                                                        +---------+---------------+---------+-----------+----------+--------------+ PFV      Full                                                         +---------+---------------+---------+-----------+----------+--------------+ POP      Full                                                        +---------+---------------+---------+-----------+----------+--------------+ PTV      Full                                                        +---------+---------------+---------+-----------+----------+--------------+ PERO     Full                                                        +---------+---------------+---------+-----------+----------+--------------+  Summary: BILATERAL: - No evidence of deep vein thrombosis seen in the lower extremities, bilaterally. - No evidence of superficial venous thrombosis in the lower extremities, bilaterally. -No evidence of popliteal cyst, bilaterally. RIGHT: -  No cystic structure found in the popliteal fossa. - Pulsatile Signals  LEFT: - No cystic structure found in the popliteal fossa. - Pulsatile signals  *See table(s) above for measurements and observations. Electronically signed by Waverly Ferrari MD on 03/26/2020 at 12:34:16 PM.    Final     Scheduled Meds: . apixaban  5 mg Oral BID  . diltiazem  180 mg Oral Daily  . flecainide  100 mg Oral BID  . furosemide  40 mg Intravenous Q12H  . potassium chloride  10 mEq Oral Daily   Continuous Infusions: . diltiazem (CARDIZEM) infusion 15 mg/hr (03/26/20 1315)   PRN Meds:.acetaminophen **OR** acetaminophen  CARDIAC STUDIES:  EKG 03/25/2020: Coarse Afbi w/RVR Nonspecific ST-T changes  Echocardiogram 03/26/2020: 1. Left ventricular ejection fraction, by estimation, is 50 to 55%. The  left ventricle has low normal function. The left ventricle has no regional  wall motion abnormalities. Left ventricular diastolic function could not  be evaluated.  2. Right ventricular systolic function is normal. The right ventricular  size is severely enlarged. There is moderately elevated pulmonary artery  systolic pressure.  3. Left atrial size was  moderately dilated.  4. Right atrial size was moderately dilated.  5. The mitral valve is normal in structure. Mild to moderate mitral valve  regurgitation.  6. Tricuspid valve regurgitation is mild to moderate.  7. The aortic valve is tricuspid. Aortic valve regurgitation is mild. No  aortic stenosis is present.    Assessment & Recommendations:  50 y.o. Caucasian male with hypertension, h/o atrial flutter-treated with ablation by Dr Johney Frame (02/2019), recent paroxysmal Afib, hypertension, prediabetes, now admitted with shortness of breath.  Paroxysmal Afib: Now rate controlled after increasing flecainide to 100 mg bid, but remains in Afib Also on diltiazem drip and PO diltiazem 180 mg This is driven by hyperthyroidism, with undetectable TSH. Ordered free T4. I would hold off cardioversion at this point, as he is very likely to have recurrence of Afib, unless hyperthyroidism is addressed. Defer management to primary team. Added propranolol 20 mg tid. Hopefully, we can wean him off diltiazem drip.   HFpEF: Driven by hyperthyroidism and Afib. Continue IV lasix 40 mg bid.  Low suspicion for ischemia. Hold off stress testing.       Elder Negus, MD Pager: [phone] Office: [phone]

## 2020-03-26 NOTE — Progress Notes (Signed)
Came to see the patient around 9:30. He has been in ultrasound getting DVT study. Will check again later. In the meantime, I have increased his flecainide to 100 mg bid, given Aflutter with poor rate control. Tentatively scheduled for cardioversion tomorrow morning at 8:30.   Elder Negus, MD Pager: [phone] Office: [phone]

## 2020-03-26 NOTE — ED Notes (Signed)
Per Cards, pt to be cardioverted 3/16  In the AM. Pt will need to be NPO as of midnight.

## 2020-03-27 ENCOUNTER — Encounter (HOSPITAL_COMMUNITY)
Admission: EM | Disposition: A | Discharge status: Home / Self Care | Payer: Self-pay | Source: Home / Self Care | Attending: Internal Medicine

## 2020-03-27 ENCOUNTER — Ambulatory Visit: Payer: 59 | Admitting: Primary Care

## 2020-03-27 DIAGNOSIS — I480 Paroxysmal atrial fibrillation: Secondary | ICD-10-CM

## 2020-03-27 LAB — COMPREHENSIVE METABOLIC PANEL
ALT: 33 U/L (ref 0–44)
AST: 54 U/L — ABNORMAL HIGH (ref 15–41)
Albumin: 2.3 g/dL — ABNORMAL LOW (ref 3.5–5.0)
Alkaline Phosphatase: 94 U/L (ref 38–126)
Anion gap: 7 (ref 5–15)
BUN: 15 mg/dL (ref 6–20)
CO2: 29 mmol/L (ref 22–32)
Calcium: 8.3 mg/dL — ABNORMAL LOW (ref 8.9–10.3)
Chloride: 96 mmol/L — ABNORMAL LOW (ref 98–111)
Creatinine, Ser: 0.95 mg/dL (ref 0.61–1.24)
GFR, Estimated: >60 mL/min (ref >60)
Glucose, Bld: 141 mg/dL — ABNORMAL HIGH (ref 70–99)
Potassium: 4 mmol/L (ref 3.5–5.1)
Sodium: 132 mmol/L — ABNORMAL LOW (ref 135–145)
Total Bilirubin: 2.6 mg/dL — ABNORMAL HIGH (ref 0.3–1.2)
Total Protein: 6.7 g/dL (ref 6.5–8.1)

## 2020-03-27 LAB — CBC
HCT: 34.9 % — ABNORMAL LOW (ref 39.0–52.0)
Hemoglobin: 11.6 g/dL — ABNORMAL LOW (ref 13.0–17.0)
MCH: 31.8 pg (ref 26.0–34.0)
MCHC: 33.2 g/dL (ref 30.0–36.0)
MCV: 95.6 fL (ref 80.0–100.0)
Platelets: 261 10*3/uL (ref 150–400)
RBC: 3.65 MIL/uL — ABNORMAL LOW (ref 4.22–5.81)
RDW: 15.2 % (ref 11.5–15.5)
WBC: 12 10*3/uL — ABNORMAL HIGH (ref 4.0–10.5)
nRBC: 0 % (ref 0.0–0.2)

## 2020-03-27 LAB — MAGNESIUM: Magnesium: 1.9 mg/dL (ref 1.7–2.4)

## 2020-03-27 LAB — PHOSPHORUS: Phosphorus: 4.4 mg/dL (ref 2.5–4.6)

## 2020-03-27 SURGERY — CARDIOVERSION
Anesthesia: General

## 2020-03-27 MED ORDER — METHIMAZOLE 10 MG PO TABS
10.0000 mg | ORAL_TABLET | Freq: Two times a day (BID) | ORAL | Status: DC
  Administered 2020-03-27 – 2020-03-28 (×3): 10 mg via ORAL
  Filled 2020-03-27 (×3): qty 1

## 2020-03-27 MED ORDER — FUROSEMIDE 10 MG/ML IJ SOLN
20.0000 mg | Freq: Two times a day (BID) | INTRAMUSCULAR | Status: DC
  Administered 2020-03-27 – 2020-03-28 (×2): 20 mg via INTRAVENOUS
  Filled 2020-03-27 (×2): qty 2

## 2020-03-27 NOTE — Progress Notes (Signed)
Subjective:  Feels better  Converted to sinus rhythm on 03/26/20 evening  Objective:  Vital Signs in the last 24 hours: Temp:  [98 F (36.7 C)-98.3 F (36.8 C)] 98 F (36.7 C) (03/16 0847) Pulse Rate:  [57-107] 72 (03/16 1019) Resp:  [16-27] 16 (03/16 0847) BP: (97-127)/(56-86) 117/56 (03/16 1019) SpO2:  [94 %-99 %] 95 % (03/16 0847) Weight:  [86.9 kg-87 kg] 87 kg (03/16 0440)  Intake/Output from previous day: 03/15 0701 - 03/16 0700 In: 434.6 [P.O.:240; I.V.:194.6] Out: 1800 [Urine:1800]  Physical Exam Vitals and nursing note reviewed.  Constitutional:      General: He is not in acute distress.    Appearance: He is well-developed.  HENT:     Head: Normocephalic and atraumatic.  Eyes:     Conjunctiva/sclera: Conjunctivae normal.     Pupils: Pupils are equal, round, and reactive to light.  Neck:     Vascular: No JVD.  Cardiovascular:     Rate and Rhythm: Normal rate and regular rhythm.     Pulses: Normal pulses and intact distal pulses.     Heart sounds: No murmur heard.   Pulmonary:     Effort: Pulmonary effort is normal.     Breath sounds: Normal breath sounds. No wheezing or rales.  Abdominal:     General: Bowel sounds are normal.     Palpations: Abdomen is soft.     Tenderness: There is no rebound.  Musculoskeletal:        General: No tenderness. Normal range of motion.     Right lower leg: Edema (TRrace) present.     Left lower leg: Edema (Trace) present.  Lymphadenopathy:     Cervical: No cervical adenopathy.  Skin:    General: Skin is warm and dry.  Neurological:     Mental Status: He is alert and oriented to person, place, and time.     Cranial Nerves: No cranial nerve deficit.      Lab Results: BMP Recent Labs    03/18/20 1420 03/25/20 2335 03/27/20 0336  NA 136 133* 132*  K 3.4* 3.3* 4.0  CL 99 100 96*  CO2 27 26 29   GLUCOSE 93 112* 141*  BUN 7 9 15   CREATININE 0.40* 0.38* 0.95  CALCIUM 8.5* 8.4* 8.3*  GFRNONAA >60 >60 >60     CBC Recent Labs  Lab 03/27/20 0336  WBC 12.0*  RBC 3.65*  HGB 11.6*  HCT 34.9*  PLT 261  MCV 95.6  MCH 31.8  MCHC 33.2  RDW 15.2    HEMOGLOBIN A1C Lab Results  Component Value Date   HGBA1C 5.5 03/26/2020   MPG 111.15 03/26/2020    Cardiac Panel (last 3 results) No results for input(s): CKTOTAL, CKMB, TROPONINI, RELINDX in the last 8760 hours.  BNP (last 3 results) Recent Labs    03/26/20 0741  BNP 886.5*    TSH Recent Labs    03/26/20 0740  TSH 0.000*    Lipid Panel     Component Value Date/Time   CHOL 151 07/03/2016 0933   TRIG 70.0 07/03/2016 0933   HDL 45.20 07/03/2016 0933   CHOLHDL 3 07/03/2016 0933   VLDL 14.0 07/03/2016 0933   LDLCALC 92 07/03/2016 0933     Hepatic Function Panel Recent Labs    03/18/20 1420 03/27/20 0336  PROT 6.8 6.7  ALBUMIN 2.4* 2.3*  AST 54* 54*  ALT 34 33  ALKPHOS 117 94  BILITOT 2.4* 2.6*    Cardiac Studies:  Telemetry  03/27/2020; Sinus rhythm around 60 bpm  EKG 03/25/2020: Coarse Afib w/RVR Nonspecific ST-T changes  Echocardiogram 03/26/2020: 1. Left ventricular ejection fraction, by estimation, is 50 to 55%. The  left ventricle has low normal function. The left ventricle has no regional  wall motion abnormalities. Left ventricular diastolic function could not  be evaluated.  2. Right ventricular systolic function is normal. The right ventricular  size is severely enlarged. There is moderately elevated pulmonary artery  systolic pressure.  3. Left atrial size was moderately dilated.  4. Right atrial size was moderately dilated.  5. The mitral valve is normal in structure. Mild to moderate mitral valve  regurgitation.  6. Tricuspid valve regurgitation is mild to moderate.  7. The aortic valve is tricuspid. Aortic valve regurgitation is mild. No  aortic stenosis is present.    Assessment & Recommendations:  50 y.o.Caucasianmalewith hypertension, h/o atrial flutter-treated with  ablation by Dr Johney Frame (02/2019), recent paroxysmal Afib, hypertension, prediabetes, now admitted with shortness of breath.  Paroxysmal Afib: Converted to sinus rhythm on 03/26/20 evening Continue flecainide to 100 mg bid Switched diltiazem PO 180 mg to propranolol 20 mg tid, given diagnosis of hyperthyroidism Afib is driven by hyperthyroidism, with undetectable TSH, free T4 >5.5 Defer management to primary team.  HFpEF: Driven by hyperthyroidism and Afib. Switch IV lasix to PO lasix 40 mg daily on discharge.  Elevated troponin: Likely due to demand ischemia. Will consider outpatient stress test to evaluate for ischemia on flecainide tolerability, after hyperthyroid state is controlled.  Will sign off and arrange outpatient f/u.   Elder Negus, MD Pager: [phone] Office: [phone]

## 2020-03-27 NOTE — Progress Notes (Signed)
PROGRESS NOTE    Kevin Branch  GYK:[id number] DOB: 1970/01/22 DOA: 03/25/2020 PCP: Doreene Nest, NP  Brief Narrative: 50 year old male with history of atrial flutter status post ablation in 2/21, had recurrence of A. fib last month was seen in cardiology clinic and was started on Cardizem flecainide and Eliquis.  Presented to the ED due to worsening palpitations with chest tightness and increasing shortness of breath associated with orthopnea and lower extremity edema -In the ED he was noted to be in rapid A. fib, on further evaluation he was noted to have thyrotoxicosis   Assessment & Plan:   Atrial flutter with rapid ventricular response (HCC) -Converted to sinus rhythm -Started on propranolol -Also remains on flecainide and diltiazem, could titrate these down -Continue Eliquis for anticoagulation -Improving after starting treatment for severe thyrotoxicosis   Severe thyrotoxicosis -TSH was undetectable and free T4 was greater than 5.5 -On exam has exophthalmos, tremors -Also has chronic diarrhea and palpitations with rapid A. 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   Difficult to place patient No  Consultants:   Cardiology   Procedures:    Antimicrobials:    Subjective: -Feels better today, breathing better, swelling is improving  Objective: Vitals:   03/27/20 0440 03/27/20 0847 03/27/20 1019 03/27/20 1325  BP: 115/76 98/86 (!) 117/56 117/67  Pulse: 68 71 72 64  Resp: 18 16  15   Temp: 98.3 F (36.8 C) 98 F (36.7 C)  98.4 F (36.9 C)  TempSrc: Oral Oral  Oral  SpO2: 96% 95%  96%  Weight: 87 kg     Height:        Intake/Output Summary (Last 24 hours) at 03/27/2020 1405 Last data filed at 03/26/2020 2235 Gross per 24 hour  Intake 434.58 ml  Output 200 ml  Net 234.58 ml   Filed Weights   03/25/20 2327 03/26/20 1528 03/27/20 0440  Weight: 92 kg 86.9 kg 87 kg    Examination:  General exam: Appears calm and comfortable HEENT: Positive exophthalmos, thyroid exam is unremarkable Respiratory system: Clear to auscultation. Respiratory effort normal. Cardiovascular system: S1 & S2 heard, RRR. No JVD, murmurs, rubs, gallops Gastrointestinal system: Abdomen is nondistended, soft and nontender.Normal bowel sounds heard. Extremities: 1+ edema Skin: Tattoos on both lower extremities  Data Reviewed:   CBC: Recent Labs  Lab 03/25/20 2335 03/27/20 0336  WBC 9.0 12.0*  HGB 11.2* 11.6*  HCT 33.5* 34.9*  MCV 96.0 95.6  PLT 261 261   Basic Metabolic Panel: Recent Labs  Lab 03/25/20 2335 03/26/20 0740 03/27/20 0336  NA 133*  --  132*  K 3.3*  --  4.0  CL 100  --  96*  CO2 26  --  29  GLUCOSE 112*  --  141*  BUN 9  --  15  CREATININE 0.38*  --  0.95  CALCIUM 8.4*  --  8.3*  MG  --  1.9 1.9  PHOS  --   --  4.4   GFR: Estimated Creatinine Clearance: 99.7 mL/min (by C-G formula based on SCr of 0.95 mg/dL). Liver Function Tests: Recent Labs  Lab 03/27/20 0336  AST 54*  ALT 33  ALKPHOS 94  BILITOT 2.6*  PROT 6.7  ALBUMIN 2.3*   No results for input(s): LIPASE, AMYLASE in the last 168 hours. No results for input(s): AMMONIA in the last 168 hours. Coagulation Profile: Recent Labs  Lab  03/25/20 2335  INR 1.6*   Cardiac Enzymes: No results for input(s): CKTOTAL, CKMB, CKMBINDEX, TROPONINI in the last 168 hours. BNP (last 3 results) No results for input(s): PROBNP in the last 8760 hours. HbA1C: Recent Labs    03/26/20 0741  HGBA1C 5.5   CBG: No results for input(s): GLUCAP in the last 168 hours. Lipid Profile: No results for input(s): CHOL, HDL, LDLCALC, TRIG, CHOLHDL, LDLDIRECT in the last 72 hours. Thyroid Function Tests: Recent Labs    03/26/20 0740 03/26/20 1615  TSH 0.000*  --   FREET4  --  >5.50*   Anemia Panel: No results for input(s): VITAMINB12, FOLATE, FERRITIN, TIBC, IRON, RETICCTPCT in the last 72 hours. Urine analysis: No results found for: COLORURINE, APPEARANCEUR, LABSPEC, PHURINE, GLUCOSEU, HGBUR, BILIRUBINUR, KETONESUR, PROTEINUR, UROBILINOGEN, NITRITE, LEUKOCYTESUR Sepsis Labs: (procalcitonin:4,lacticidven:4)  ) Recent Results (from the past 240 hour(s))  Resp Panel by RT-PCR (Flu A&B, Covid) Nasopharyngeal Swab     Status: None   Collection Time: 03/26/20  4:36 AM   Specimen: Nasopharyngeal Swab; Nasopharyngeal(NP) swabs in vial transport medium  Result Value Ref Range Status   SARS Coronavirus 2 by RT PCR NEGATIVE NEGATIVE Final    Comment: (NOTE) SARS-CoV-2 target nucleic acids are NOT DETECTED.  The SARS-CoV-2 RNA is generally detectable in upper respiratory specimens during the acute phase of infection. The lowest concentration of SARS-CoV-2 viral copies this assay can detect is 138 copies/mL. A negative result does not preclude SARS-Cov-2 infection and should not be used as the sole basis for treatment or other patient management decisions. A negative result may occur with  improper specimen collection/handling, submission of specimen other than nasopharyngeal swab, presence of viral mutation(s) within the areas targeted by this assay, and inadequate number of viral copies(<138 copies/mL). A negative result must be  combined with clinical observations, patient history, and epidemiological information. The expected result is Negative.  Fact Sheet for Patients:  BloggerCourse.com  Fact Sheet for Healthcare Providers:  SeriousBroker.it  This test is no t yet approved or cleared by the [place] FDA and  has been authorized for detection and/or diagnosis of SARS-CoV-2 by FDA under an Emergency Use Authorization (EUA). This EUA will remain  in effect (meaning this test can be used) for the duration of the COVID-19 declaration under Section 564(b)(1) of the Act, 21 U.S.C.section 360bbb-3(b)(1), unless the authorization is terminated  or revoked sooner.       Influenza A by PCR NEGATIVE NEGATIVE Final   Influenza B by PCR NEGATIVE NEGATIVE Final    Comment: (NOTE) The Xpert Xpress SARS-CoV-2/FLU/RSV plus assay is intended as an aid in the diagnosis of influenza from Nasopharyngeal swab specimens and should not be used as a sole basis for treatment. Nasal washings and aspirates are unacceptable for Xpert Xpress SARS-CoV-2/FLU/RSV testing.  Fact Sheet for Patients: BloggerCourse.com  Fact  Sheet for Healthcare Providers: SeriousBroker.ithttps://www.fda.gov/media/152162/download  This test is not yet approved or cleared by the [place] FDA and has been authorized for detection and/or diagnosis of SARS-CoV-2 by FDA under an Emergency Use Authorization (EUA). This EUA will remain in effect (meaning this test can be used) for the duration of the COVID-19 declaration under Section 564(b)(1) of the Act, 21 U.S.C. section 360bbb-3(b)(1), unless the authorization is terminated or revoked.  Performed at [facility], [address] [place], [place] [address]          Radiology Studies: DG Chest 2 View  Result Date: 03/25/2020 CLINICAL DATA:  Chest tightness and shortness of breath with palpitations. EXAM: CHEST - 2 VIEW  COMPARISON:  March 05, 2020 FINDINGS: The heart size and mediastinal contours are within normal limits. Low lung volumes are noted. Both lungs are clear. The visualized skeletal structures are unremarkable. IMPRESSION: No active cardiopulmonary disease. Electronically Signed   By: Aram Candelahaddeus  Houston M.D.   On: 03/25/2020 23:45   ECHOCARDIOGRAM COMPLETE  Result Date: 03/26/2020    ECHOCARDIOGRAM REPORT   Patient Name:   Kevin Branch Date of Exam: 03/26/2020 Medical Rec #:  [id number]      Height:       68.0 in Accession #:    [id number]     Weight:       202.8 lb Date of Birth:  [birth year]      BSA:          2.056 m Patient Age:    49 years       BP:           122/70 mmHg Patient Gender: M              HR:           83 bpm. Exam Location:  Inpatient Procedure: 2D Echo, Cardiac Doppler, Color Doppler and Intracardiac            Opacification Agent Indications:    Congestive Heart Failure I50.9  History:        Patient has prior history of Echocardiogram examinations, most                 recent 03/01/2019. Arrythmias:Atrial Flutter; Risk                 Factors:Hypertension, Dyslipidemia and Current Smoker.  Sonographer:    Renella CunasJulia Swaim RDCS Referring Phys: [id number] Meryle ReadyARSHAD N [facility]AK[facility] IMPRESSIONS  1. Left ventricular ejection fraction, by estimation, is 50 to 55%. The left ventricle has low normal function. The left ventricle has no regional wall motion abnormalities. Left ventricular diastolic function could not be evaluated.  2. Right ventricular systolic function is normal. The right ventricular size is severely enlarged. There is moderately elevated pulmonary artery systolic pressure.  3. Left atrial size was moderately dilated.  4. Right atrial size was moderately dilated.  5. The mitral valve is normal in structure. Mild to moderate mitral valve regurgitation.  6. Tricuspid valve regurgitation is mild to moderate.  7. The aortic valve is tricuspid. Aortic valve regurgitation is mild. No aortic stenosis is  present. Comparison(s): Prior images unable to be directly viewed, comparison made by report only. FINDINGS  Left Ventricle: Left ventricular ejection fraction, by estimation, is 50 to 55%. The left ventricle has low normal function. The left ventricle has no regional wall motion abnormalities. Definity contrast agent was given IV to delineate the left ventricular endocardial borders. The left ventricular internal cavity  size was normal in size. There is no left ventricular hypertrophy. Left ventricular diastolic function could not be evaluated due to atrial fibrillation. Left ventricular diastolic function could not be evaluated. Right Ventricle: The right ventricular size is severely enlarged. No increase in right ventricular wall thickness. Right ventricular systolic function is normal. There is moderately elevated pulmonary artery systolic pressure. The tricuspid regurgitant velocity is 2.83 m/s, and with an assumed right atrial pressure of 15 mmHg, the estimated right ventricular systolic pressure is 47.0 mmHg. Left Atrium: Left atrial size was moderately dilated. Right Atrium: Right atrial size was moderately dilated. Pericardium: There is no evidence of pericardial effusion. Mitral Valve: The mitral valve is normal in structure. Mild to moderate mitral valve regurgitation, with centrally-directed jet. Tricuspid Valve: The tricuspid valve is normal in structure. Tricuspid valve regurgitation is mild to moderate. Aortic Valve: The aortic valve is tricuspid. Aortic valve regurgitation is mild. No aortic stenosis is present. Pulmonic Valve: The pulmonic valve was grossly normal. Pulmonic valve regurgitation is not visualized. Aorta: The aortic root is normal in size and structure. IAS/Shunts: No atrial level shunt detected by color flow Doppler.  LEFT VENTRICLE PLAX 2D LVIDd:         5.00 cm LVIDs:         3.70 cm LV PW:         1.00 cm LV IVS:        1.00 cm LVOT diam:     2.30 cm LV SV:         106 LV SV Index:    52 LVOT Area:     4.15 cm  LV Volumes (MOD) LV vol d, MOD A4C: 120.0 ml LV vol s, MOD A4C: 60.9 ml LV SV MOD A4C:     120.0 ml RIGHT VENTRICLE RV Basal diam:  5.27 cm RV S prime:     18.30 cm/s TAPSE (M-mode): 1.7 cm LEFT ATRIUM             Index       RIGHT ATRIUM           Index LA diam:        4.40 cm 2.14 cm/m  RA Area:     26.30 cm LA Vol (A2C):   72.6 ml 35.31 ml/m RA Volume:   94.90 ml  46.16 ml/m LA Vol (A4C):   76.5 ml 37.21 ml/m LA Biplane Vol: 75.7 ml 36.82 ml/m  AORTIC VALVE LVOT Vmax:   161.00 cm/s LVOT Vmean:  110.000 cm/s LVOT VTI:    0.256 m  AORTA Ao Root diam: 3.50 cm Ao Asc diam:  3.40 cm TRICUSPID VALVE TR Peak grad:   32.0 mmHg TR Vmax:        283.00 cm/s  SHUNTS Systemic VTI:  0.26 m Systemic Diam: 2.30 cm Rachelle Hora Croitoru MD Electronically signed by Thurmon Fair MD Signature Date/Time: 03/26/2020/11:18:58 AM    Final    VAS [place] LOWER EXTREMITY VENOUS (DVT)  Result Date: 03/26/2020  Lower Venous DVT Study Indications: Edema. Other Indications: Possible CHF. Risk Factors: A-fib None identified. Anticoagulation: Eliquis. Comparison Study: No previous Performing Technologist: Clint Guy RVT  Examination Guidelines: A complete evaluation includes B-mode imaging, spectral Doppler, color Doppler, and power Doppler as needed of all accessible portions of each vessel. Bilateral testing is considered an integral part of a complete examination. Limited examinations for reoccurring indications may be performed as noted. The reflux portion of the exam is performed with the patient in reverse  Trendelenburg.  +---------+---------------+---------+-----------+----------+--------------+ RIGHT    CompressibilityPhasicitySpontaneityPropertiesThrombus Aging +---------+---------------+---------+-----------+----------+--------------+ CFV      Full                                                        +---------+---------------+---------+-----------+----------+--------------+ SFJ       Full                                                        +---------+---------------+---------+-----------+----------+--------------+ FV Prox  Full                                                        +---------+---------------+---------+-----------+----------+--------------+ FV Mid   Full                                                        +---------+---------------+---------+-----------+----------+--------------+ FV DistalFull                                                        +---------+---------------+---------+-----------+----------+--------------+ PFV      Full                                                        +---------+---------------+---------+-----------+----------+--------------+ POP      Full                                                        +---------+---------------+---------+-----------+----------+--------------+ PTV      Full                                                        +---------+---------------+---------+-----------+----------+--------------+ PERO     Full                                                        +---------+---------------+---------+-----------+----------+--------------+   +---------+---------------+---------+-----------+----------+--------------+ LEFT     CompressibilityPhasicitySpontaneityPropertiesThrombus Aging +---------+---------------+---------+-----------+----------+--------------+ CFV      Full                                                        +---------+---------------+---------+-----------+----------+--------------+  SFJ      Full                                                        +---------+---------------+---------+-----------+----------+--------------+ FV Prox  Full                                                        +---------+---------------+---------+-----------+----------+--------------+ FV Mid   Full                                                         +---------+---------------+---------+-----------+----------+--------------+ FV DistalFull                                                        +---------+---------------+---------+-----------+----------+--------------+ PFV      Full                                                        +---------+---------------+---------+-----------+----------+--------------+ POP      Full                                                        +---------+---------------+---------+-----------+----------+--------------+ PTV      Full                                                        +---------+---------------+---------+-----------+----------+--------------+ PERO     Full                                                        +---------+---------------+---------+-----------+----------+--------------+  Summary: BILATERAL: - No evidence of deep vein thrombosis seen in the lower extremities, bilaterally. - No evidence of superficial venous thrombosis in the lower extremities, bilaterally. -No evidence of popliteal cyst, bilaterally. RIGHT: - No cystic structure found in the popliteal fossa. - Pulsatile Signals  LEFT: - No cystic structure found in the popliteal fossa. - Pulsatile signals  *See table(s) above for measurements and observations. Electronically signed by Waverly Ferrari MD on 03/26/2020 at 12:34:16 PM.    Final         Scheduled Meds: . apixaban  5 mg Oral BID  . diltiazem  180  mg Oral Daily  . flecainide  100 mg Oral BID  . furosemide  20 mg Intravenous Q12H  . methimazole  10 mg Oral BID  . potassium chloride  10 mEq Oral Daily  . propranolol  20 mg Oral TID   Continuous Infusions:   LOS: 1 day    Time spent:  Zannie Cove, MD [facility]  03/27/2020, 2:05 PM

## 2020-03-27 NOTE — Progress Notes (Signed)
Heart Failure Stewardship Pharmacist Progress Note   PCP: Doreene Nest, NP PCP-Cardiologist: No primary care provider on file.    HPI:  50 yo M with PMH of atrial flutter, HTN, HLD, and obesity. He presented to the ED on 03/25/20 with palpitations, chest tightness, shortness of breath, LE edema, and orthopnea. An ECHO was done on 03/26/20 and LVEF was 50-55%. Cardioversion planned for 3/16 AM.  Current HF Medications:  Furosemide 20 mg IV BID  Prior to admission HF Medications: Furosemide 20 mg BID  Pertinent Lab Values: . Serum creatinine 0.38>0.95, BUN 15, Potassium 4.0, Sodium 132, BNP 886.5, Magnesium 1.9   Vital Signs: . Weight: 191 lbs (admission weight: 202 lbs) . Blood pressure: 90-110/60s  . Heart rate: 60s   Medication Assistance / Insurance Benefits Check: Does the patient have prescription insurance?  Yes Type of insurance plan: [employer] - commercial insurance  Outpatient Pharmacy:  Prior to admission outpatient [facility] Is the patient willing to use [facility] TOC pharmacy at discharge? Yes Is the patient willing to transition their outpatient pharmacy to utilize a [facility] outpatient pharmacy?   Pending    Assessment: 1. 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 Stress test not yet completed, also with afib. NYHA class III symptoms. - Agree with reducing furosemide to 20 mg IV BID. Potassium 10 mEq daily ordered for replacement. - Consider starting Entresto 24/26 mg BID for new indication of LVEF <57% once BP and SCr allow - Consider starting Farxiga or Jardiance 10 mg daily for HFpEF once SCr allows (0.38>0.95 today)   Plan: 1) Medication changes recommended at this time: - Continue current regimen pending SCr improvement  2) Patient assistance: - Entresto copay $200 per month - can enroll in monthly copay card to lower to $10 per 1-3 month supply - Jardiance copay $200 per month - can enroll in monthly copay card to lower to $10  per 1-3 month supply - Farxiga copay $75 per month - can enroll in monthly copay card to lower to $0 per month  3)  Education  - To be completed prior to discharge  Sharen Hones, PharmD, BCPS Heart Failure Stewardship Pharmacist Phone [phone]

## 2020-03-28 ENCOUNTER — Telehealth: Payer: Self-pay

## 2020-03-28 DIAGNOSIS — I4892 Unspecified atrial flutter: Secondary | ICD-10-CM

## 2020-03-28 LAB — BASIC METABOLIC PANEL
Anion gap: 8 (ref 5–15)
BUN: 25 mg/dL — ABNORMAL HIGH (ref 6–20)
CO2: 26 mmol/L (ref 22–32)
Calcium: 8.2 mg/dL — ABNORMAL LOW (ref 8.9–10.3)
Chloride: 97 mmol/L — ABNORMAL LOW (ref 98–111)
Creatinine, Ser: 1.04 mg/dL (ref 0.61–1.24)
GFR, Estimated: >60 mL/min (ref >60)
Glucose, Bld: 128 mg/dL — ABNORMAL HIGH (ref 70–99)
Potassium: 4 mmol/L (ref 3.5–5.1)
Sodium: 131 mmol/L — ABNORMAL LOW (ref 135–145)

## 2020-03-28 LAB — CBC
HCT: 34.5 % — ABNORMAL LOW (ref 39.0–52.0)
Hemoglobin: 11.4 g/dL — ABNORMAL LOW (ref 13.0–17.0)
MCH: 31.8 pg (ref 26.0–34.0)
MCHC: 33 g/dL (ref 30.0–36.0)
MCV: 96.4 fL (ref 80.0–100.0)
Platelets: 249 10*3/uL (ref 150–400)
RBC: 3.58 MIL/uL — ABNORMAL LOW (ref 4.22–5.81)
RDW: 15.2 % (ref 11.5–15.5)
WBC: 12.6 10*3/uL — ABNORMAL HIGH (ref 4.0–10.5)
nRBC: 0 % (ref 0.0–0.2)

## 2020-03-28 MED ORDER — PROPRANOLOL HCL 20 MG PO TABS: 20.0000 mg | ORAL_TABLET | Freq: Three times a day (TID) | ORAL | 0 refills | Status: DC

## 2020-03-28 MED ORDER — FUROSEMIDE 20 MG PO TABS: 20.0000 mg | ORAL_TABLET | Freq: Every day | ORAL | 0 refills | Status: DC

## 2020-03-28 MED ORDER — METHIMAZOLE 10 MG PO TABS: 10.0000 mg | ORAL_TABLET | Freq: Two times a day (BID) | ORAL | 0 refills | Status: DC

## 2020-03-28 NOTE — Telephone Encounter (Signed)
Transition Care Management Unsuccessful Follow-up Telephone Call  Date of discharge and from where:  03/28/2020, [facility]  Attempts:  2nd Attempt  Reason for unsuccessful TCM follow-up call:  Voice mail full

## 2020-03-28 NOTE — Telephone Encounter (Signed)
Transition Care Management Unsuccessful Follow-up Telephone Call  Date of discharge and from where:  03/28/2020, [facility]  Attempts:  1st Attempt  Reason for unsuccessful TCM follow-up call:  Voice mail full

## 2020-03-28 NOTE — Discharge Summary (Signed)
Physician Discharge Summary  Kevin Branch:[id number] DOB: 1970-01-31 DOA: 03/25/2020  PCP: Doreene Nest, NP  Admit date: 03/25/2020 Discharge date: 03/28/2020  Time spent: 35 minutes  Recommendations for Outpatient Follow-up:  1. Referral sent to [facility] for severe thyrotoxicosis 2. Cardiology Dr.Patwardhan in 2 weeks, titrate down diuretics at follow-up 3. PCP in 1 week   Discharge Diagnoses:  Principal Problem:   Atrial flutter with rapid ventricular response (HCC) Severe thyrotoxicosis High-output heart failure    Essential hypertension   Acute CHF (congestive heart failure) (HCC)   Discharge Condition: Improved  Diet recommendation: Low-sodium, heart healthy  Filed Weights   03/26/20 1528 03/27/20 0440 03/28/20 0542  Weight: 86.9 kg 87 kg 87.3 kg    History of present illness:   50 year old male with history of atrial flutter status post ablation in 2/21, had recurrence of A. fib last month was seen in cardiology clinic and was started on Cardizem flecainide and Eliquis.  Presented to the ED due to worsening palpitations with chest tightness and increasing shortness of breath associated with orthopnea and lower extremity edema -In the ED he was noted to be in rapid A. fib, on further evaluation he was noted to have thyrotoxicosis   Hospital Course:   Atrial flutter with rapid ventricular response (HCC) --Improving after starting treatment for severe thyrotoxicosis  -Converted to sinus rhythm -Started on propranolol -Also remains on flecainide and diltiazem, could titrate these down -Continue Eliquis for anticoagulation -Follow-up with cardiology  Severe thyrotoxicosis -TSH was undetectable and free T4 was greater than 5.5 -On exam has exophthalmos, tremors, chronic diarrhea and palpitations with rapid A. Fib -Improving after starting methimazole and propranolol this admission -Thyroid exam is unremarkable, could be Graves' disease, will  need radioactive iodine uptake and further work-up as outpatient, referral sent to [facility] at discharge  Likely high-output heart failure Secondary to thyrotoxicosis -volume status improving, Lasix dose decreased at discharge  Consultations:  Cardiology  Discharge Exam: Vitals:   03/28/20 0542 03/28/20 0900  BP: 115/67 111/61  Pulse: 66   Resp: 18 16  Temp: 98.1 F (36.7 C) 98.4 F (36.9 C)  SpO2: 95% 92%    General: Awake alert oriented x3 Cardiovascular: S1-S2, regular rate rhythm Respiratory: Clear  Discharge Instructions   Discharge Instructions    Ambulatory referral to Endocrinology   Complete by: As directed    Diet - low sodium heart healthy   Complete by: As directed    Increase activity slowly   Complete by: As directed      Allergies as of 03/28/2020   No Known Allergies     Medication List    TAKE these medications   acetaminophen 325 MG tablet Commonly known as: TYLENOL Take 650 mg by mouth every 6 (six) hours as needed for mild pain.   apixaban 5 MG Tabs tablet Commonly known as: ELIQUIS Take 1 tablet (5 mg total) by mouth 2 (two) times daily.   calcium carbonate 500 MG chewable tablet Commonly known as: TUMS - dosed in mg elemental calcium Chew 2 tablets by mouth daily as needed for indigestion or heartburn.   diltiazem 180 MG 24 hr capsule Commonly known as: CARDIZEM CD Take 1 capsule (180 mg total) by mouth daily.   diltiazem 30 MG tablet Commonly known as: Cardizem Take 1 tablet (30 mg total) by mouth 3 (three) times daily as needed (for palpitations with heart rate > 110 despite taking the extended release medicine).   flecainide 50  MG tablet Commonly known as: TAMBOCOR Take 1 tablet (50 mg total) by mouth 2 (two) times daily.   furosemide 20 MG tablet Commonly known as: Lasix Take 1 tablet (20 mg total) by mouth daily. May take extra tablet daily for weight gain/swelling What changed: how much to take    methimazole 10 MG tablet Commonly known as: TAPAZOLE Take 1 tablet (10 mg total) by mouth 2 (two) times daily.   ONE-A-DAY VITACRAVES ADULT PO Take 1 tablet by mouth daily. Notes to patient: You have not had this supplement today   potassium chloride 10 MEQ tablet Commonly known as: KLOR-CON Take 1 tablet (10 mEq total) by mouth daily.   propranolol 20 MG tablet Commonly known as: INDERAL Take 1 tablet (20 mg total) by mouth 3 (three) times daily.      No Known Allergies  Follow-up Information    Doreene Nest, NP. Schedule an appointment as soon as possible for a visit in 2 week(s).   Specialty: Internal Medicine Contact information: [address] [place] [address] [phone]                The results of significant diagnostics from this hospitalization (including imaging, microbiology, ancillary and laboratory) are listed below for reference.    Significant Diagnostic Studies: DG Chest 2 View  Result Date: 03/25/2020 CLINICAL DATA:  Chest tightness and shortness of breath with palpitations. EXAM: CHEST - 2 VIEW COMPARISON:  March 05, 2020 FINDINGS: The heart size and mediastinal contours are within normal limits. Low lung volumes are noted. Both lungs are clear. The visualized skeletal structures are unremarkable. IMPRESSION: No active cardiopulmonary disease. Electronically Signed   By: Aram Candela M.D.   On: 03/25/2020 23:45   DG Chest Portable 1 View  Result Date: 03/05/2020 CLINICAL DATA:  50 year old male with chest pain. Intermittent atrial fibrillation for 3 days. High heart rate. Vomited. EXAM: PORTABLE CHEST 1 VIEW COMPARISON:  Chest radiographs 01/26/2020 and earlier. FINDINGS: Portable AP semi upright view at 0356 hours. Lung volumes and mediastinal contours remain within normal limits. Visualized tracheal air column is within normal limits. Lung markings appear stable. Allowing for portable technique the lungs are clear. No  pneumothorax or pleural effusion. Negative visible bowel gas and osseous structures. IMPRESSION: Negative portable chest. Electronically Signed   By: Odessa Fleming M.D.   On: 03/05/2020 04:02   ECHOCARDIOGRAM COMPLETE  Result Date: 03/26/2020    ECHOCARDIOGRAM REPORT   Patient Name:   Kevin Branch Date of Exam: 03/26/2020 Medical Rec #:  [id number]      Height:       68.0 in Accession #:    [id number]     Weight:       202.8 lb Date of Birth:  18-Feb-1970      BSA:          2.056 m Patient Age:    49 years       BP:           122/70 mmHg Patient Gender: M              HR:           83 bpm. Exam Location:  Inpatient Procedure: 2D Echo, Cardiac Doppler, Color Doppler and Intracardiac            Opacification Agent Indications:    Congestive Heart Failure I50.9  History:        Patient has prior history of Echocardiogram examinations,  most                 recent 03/01/2019. Arrythmias:Atrial Flutter; Risk                 Factors:Hypertension, Dyslipidemia and Current Smoker.  Sonographer:    Renella Cunas RDCS Referring Phys: [id number]eryle Ready [facility] IMPRESSIONS  1. Left ventricular ejection fraction, by estimation, is 50 to 55%. The left ventricle has low normal function. The left ventricle has no regional wall motion abnormalities. Left ventricular diastolic function could not be evaluated.  2. Right ventricular systolic function is normal. The right ventricular size is severely enlarged. There is moderately elevated pulmonary artery systolic pressure.  3. Left atrial size was moderately dilated.  4. Right atrial size was moderately dilated.  5. The mitral valve is normal in structure. Mild to moderate mitral valve regurgitation.  6. Tricuspid valve regurgitation is mild to moderate.  7. The aortic valve is tricuspid. Aortic valve regurgitation is mild. No aortic stenosis is present. Comparison(s): Prior images unable to be directly viewed, comparison made by report only. FINDINGS  Left Ventricle: Left ventricular ejection  fraction, by estimation, is 50 to 55%. The left ventricle has low normal function. The left ventricle has no regional wall motion abnormalities. Definity contrast agent was given IV to delineate the left ventricular endocardial borders. The left ventricular internal cavity size was normal in size. There is no left ventricular hypertrophy. Left ventricular diastolic function could not be evaluated due to atrial fibrillation. Left ventricular diastolic function could not be evaluated. Right Ventricle: The right ventricular size is severely enlarged. No increase in right ventricular wall thickness. Right ventricular systolic function is normal. There is moderately elevated pulmonary artery systolic pressure. The tricuspid regurgitant velocity is 2.83 m/s, and with an assumed right atrial pressure of 15 mmHg, the estimated right ventricular systolic pressure is 47.0 mmHg. Left Atrium: Left atrial size was moderately dilated. Right Atrium: Right atrial size was moderately dilated. Pericardium: There is no evidence of pericardial effusion. Mitral Valve: The mitral valve is normal in structure. Mild to moderate mitral valve regurgitation, with centrally-directed jet. Tricuspid Valve: The tricuspid valve is normal in structure. Tricuspid valve regurgitation is mild to moderate. Aortic Valve: The aortic valve is tricuspid. Aortic valve regurgitation is mild. No aortic stenosis is present. Pulmonic Valve: The pulmonic valve was grossly normal. Pulmonic valve regurgitation is not visualized. Aorta: The aortic root is normal in size and structure. IAS/Shunts: No atrial level shunt detected by color flow Doppler.  LEFT VENTRICLE PLAX 2D LVIDd:         5.00 cm LVIDs:         3.70 cm LV PW:         1.00 cm LV IVS:        1.00 cm LVOT diam:     2.30 cm LV SV:         106 LV SV Index:   52 LVOT Area:     4.15 cm  LV Volumes (MOD) LV vol d, MOD A4C: 120.0 ml LV vol s, MOD A4C: 60.9 ml LV SV MOD A4C:     120.0 ml RIGHT VENTRICLE RV  Basal diam:  5.27 cm RV S prime:     18.30 cm/s TAPSE (M-mode): 1.7 cm LEFT ATRIUM             Index       RIGHT ATRIUM           Index LA  diam:        4.40 cm 2.14 cm/m  RA Area:     26.30 cm LA Vol (A2C):   72.6 ml 35.31 ml/m RA Volume:   94.90 ml  46.16 ml/m LA Vol (A4C):   76.5 ml 37.21 ml/m LA Biplane Vol: 75.7 ml 36.82 ml/m  AORTIC VALVE LVOT Vmax:   161.00 cm/s LVOT Vmean:  110.000 cm/s LVOT VTI:    0.256 m  AORTA Ao Root diam: 3.50 cm Ao Asc diam:  3.40 cm TRICUSPID VALVE TR Peak grad:   32.0 mmHg TR Vmax:        283.00 cm/s  SHUNTS Systemic VTI:  0.26 m Systemic Diam: 2.30 cm Rachelle Hora Croitoru MD Electronically signed by Thurmon Fair MD Signature Date/Time: 03/26/2020/11:18:58 AM    Final    VAS [place] LOWER EXTREMITY VENOUS (DVT)  Result Date: 03/26/2020  Lower Venous DVT Study Indications: Edema. Other Indications: Possible CHF. Risk Factors: A-fib None identified. Anticoagulation: Eliquis. Comparison Study: No previous Performing Technologist: Clint Guy RVT  Examination Guidelines: A complete evaluation includes B-mode imaging, spectral Doppler, color Doppler, and power Doppler as needed of all accessible portions of each vessel. Bilateral testing is considered an integral part of a complete examination. Limited examinations for reoccurring indications may be performed as noted. The reflux portion of the exam is performed with the patient in reverse Trendelenburg.  +---------+---------------+---------+-----------+----------+--------------+ RIGHT    CompressibilityPhasicitySpontaneityPropertiesThrombus Aging +---------+---------------+---------+-----------+----------+--------------+ CFV      Full                                                        +---------+---------------+---------+-----------+----------+--------------+ SFJ      Full                                                        +---------+---------------+---------+-----------+----------+--------------+ FV Prox   Full                                                        +---------+---------------+---------+-----------+----------+--------------+ FV Mid   Full                                                        +---------+---------------+---------+-----------+----------+--------------+ FV DistalFull                                                        +---------+---------------+---------+-----------+----------+--------------+ PFV      Full                                                        +---------+---------------+---------+-----------+----------+--------------+  POP      Full                                                        +---------+---------------+---------+-----------+----------+--------------+ PTV      Full                                                        +---------+---------------+---------+-----------+----------+--------------+ PERO     Full                                                        +---------+---------------+---------+-----------+----------+--------------+   +---------+---------------+---------+-----------+----------+--------------+ LEFT     CompressibilityPhasicitySpontaneityPropertiesThrombus Aging +---------+---------------+---------+-----------+----------+--------------+ CFV      Full                                                        +---------+---------------+---------+-----------+----------+--------------+ SFJ      Full                                                        +---------+---------------+---------+-----------+----------+--------------+ FV Prox  Full                                                        +---------+---------------+---------+-----------+----------+--------------+ FV Mid   Full                                                        +---------+---------------+---------+-----------+----------+--------------+ FV DistalFull                                                         +---------+---------------+---------+-----------+----------+--------------+ PFV      Full                                                        +---------+---------------+---------+-----------+----------+--------------+ POP      Full                                                        +---------+---------------+---------+-----------+----------+--------------+  PTV      Full                                                        +---------+---------------+---------+-----------+----------+--------------+ PERO     Full                                                        +---------+---------------+---------+-----------+----------+--------------+  Summary: BILATERAL: - No evidence of deep vein thrombosis seen in the lower extremities, bilaterally. - No evidence of superficial venous thrombosis in the lower extremities, bilaterally. -No evidence of popliteal cyst, bilaterally. RIGHT: - No cystic structure found in the popliteal fossa. - Pulsatile Signals  LEFT: - No cystic structure found in the popliteal fossa. - Pulsatile signals  *See table(s) above for measurements and observations. Electronically signed by Waverly Ferrari MD on 03/26/2020 at 12:34:16 PM.    Final     Microbiology: Recent Results (from the past 240 hour(s))  Resp Panel by RT-PCR (Flu A&B, Covid) Nasopharyngeal Swab     Status: None   Collection Time: 03/26/20  4:36 AM   Specimen: Nasopharyngeal Swab; Nasopharyngeal(NP) swabs in vial transport medium  Result Value Ref Range Status   SARS Coronavirus 2 by RT PCR NEGATIVE NEGATIVE Final    Comment: (NOTE) SARS-CoV-2 target nucleic acids are NOT DETECTED.  The SARS-CoV-2 RNA is generally detectable in upper respiratory specimens during the acute phase of infection. The lowest concentration of SARS-CoV-2 viral copies this assay can detect is 138 copies/mL. A negative result does not preclude SARS-Cov-2 infection and should not be used  as the sole basis for treatment or other patient management decisions. A negative result may occur with  improper specimen collection/handling, submission of specimen other than nasopharyngeal swab, presence of viral mutation(s) within the areas targeted by this assay, and inadequate number of viral copies(<138 copies/mL). A negative result must be combined with clinical observations, patient history, and epidemiological information. The expected result is Negative.  Fact Sheet for Patients:  BloggerCourse.com  Fact Sheet for Healthcare Providers:  SeriousBroker.it  This test is no t yet approved or cleared by the [place] FDA and  has been authorized for detection and/or diagnosis of SARS-CoV-2 by FDA under an Emergency Use Authorization (EUA). This EUA will remain  in effect (meaning this test can be used) for the duration of the COVID-19 declaration under Section 564(b)(1) of the Act, 21 U.S.C.section 360bbb-3(b)(1), unless the authorization is terminated  or revoked sooner.       Influenza A by PCR NEGATIVE NEGATIVE Final   Influenza B by PCR NEGATIVE NEGATIVE Final    Comment: (NOTE) The Xpert Xpress SARS-CoV-2/FLU/RSV plus assay is intended as an aid in the diagnosis of influenza from Nasopharyngeal swab specimens and should not be used as a sole basis for treatment. Nasal washings and aspirates are unacceptable for Xpert Xpress SARS-CoV-2/FLU/RSV testing.  Fact Sheet for Patients: BloggerCourse.com  Fact Sheet for Healthcare Providers: SeriousBroker.it  This test is not yet approved or cleared by the [place] FDA and has been authorized for detection and/or diagnosis of SARS-CoV-2 by FDA under an Emergency Use Authorization (EUA). This EUA will remain  in effect (meaning this test can be used) for the duration of the COVID-19 declaration under Section 564(b)(1)  of the Act, 21 U.S.C. section 360bbb-3(b)(1), unless the authorization is terminated or revoked.  Performed at [facility], [address] [place], [place] [address]      Labs: Basic Metabolic Panel: Recent Labs  Lab 03/25/20 2335 03/26/20 0740 03/27/20 0336 03/28/20 0213  NA 133*  --  132* 131*  K 3.3*  --  4.0 4.0  CL 100  --  96* 97*  CO2 26  --  29 26  GLUCOSE 112*  --  141* 128*  BUN 9  --  15 25*  CREATININE 0.38*  --  0.95 1.04  CALCIUM 8.4*  --  8.3* 8.2*  MG  --  1.9 1.9  --   PHOS  --   --  4.4  --    Liver Function Tests: Recent Labs  Lab 03/27/20 0336  AST 54*  ALT 33  ALKPHOS 94  BILITOT 2.6*  PROT 6.7  ALBUMIN 2.3*   No results for input(s): LIPASE, AMYLASE in the last 168 hours. No results for input(s): AMMONIA in the last 168 hours. CBC: Recent Labs  Lab 03/25/20 2335 03/27/20 0336 03/28/20 0213  WBC 9.0 12.0* 12.6*  HGB 11.2* 11.6* 11.4*  HCT 33.5* 34.9* 34.5*  MCV 96.0 95.6 96.4  PLT 261 261 249   Cardiac Enzymes: No results for input(s): CKTOTAL, CKMB, CKMBINDEX, TROPONINI in the last 168 hours. BNP: BNP (last 3 results) Recent Labs    03/26/20 0741  BNP 886.5*    ProBNP (last 3 results) No results for input(s): PROBNP in the last 8760 hours.  CBG: No results for input(s): GLUCAP in the last 168 hours.     Signed:  Zannie Cove MD.  [facility] 03/28/2020, 9:57 AM

## 2020-03-28 NOTE — Progress Notes (Signed)
Primary Care Physician: Doreene Nest, NP Referring Physician: [facility] ER f/u EP: Dr. Johney Frame  Primary Cardiology: Dr Clarisa Kindred is a 50 y.o. male with a h/o  COVID-19 (02/01/20), atrial flutter s/p partial ablation in 2/21, diabetes mellitus, hyperlipidemia, HTN who presented to the emergency department, 2/22 with palpitations. Present x one week. Pt states that he was having some issues with irregular heart beat back in December.  He was found to have new onset afib with RVR. He was seen by Dr. Johney Frame and placed back on eliquis 5 mg bid for CHA2DS2VASc score of 2. (stopped after aflutter ablation). He was also started on diltiazem 180 mg daily. He had been off lisinopril.(This was not restarted since CCB was added but may need to be restarted if HTN elevates.) It was suggested to add flecainide after he was adequately  anticoagulated with possible subsequent cardioversion.   In the clinic 03/08/20, EKG shows afib with RVR in the 120's. He is taking eliquis, first full day of anticoagualtion is today as he had only been taking once a day since Wednesday. He is taking 180 mg Cardizem in the am but has been taking the 30 mg Cardizem 3x a day to keep HR less than 100. Patient seen 03/11/20 after increasing diltiazem and he was in SR.   Follow up in the AF clinic 03/14/20. Patient reports that he has had several episodes of tachypalpitations since his last visit despite increasing diltiazem. He has also noted much more lower extremity edema and an 8 lbs weight gain. He denies any bleeding issues on anticoagulation.   Follow up in the AF clinic 03/18/20. Patient reports that he has not had tachypalpitations since starting flecainide. However, his lower extremity edema has not improved. He denies any bleeding issues on anticoagulation.   Follow up in the AF clinic 03/29/20. Patient presented to the ED 03/26/20 with sudden onset SOB and rapid atrial flutter. He was admitted. Of note, he was  found to be hyperthyroid with undetectable TSH and elevated T4. He was also diuresed with IV lasix. He converted to SR with treatment of his thyroid and fluid overload. He feels much improved today with less edema.  Today, he denies symptoms of palpitations, chest pain, shortness of breath, orthopnea, PND, dizziness, presyncope, syncope, or neurologic sequela. The patient is tolerating medications without difficulties and is otherwise without complaint today.   Past Medical History:  Diagnosis Date  . Essential hypertension   . Hyperglycemia   . Hyperlipidemia   . Overweight   . Typical atrial flutter [facility])    Past Surgical History:  Procedure Laterality Date  . A-FLUTTER ABLATION N/A 03/03/2019   Procedure: A-FLUTTER ABLATION;  Surgeon: Hillis Range, MD;  Location: [facility] INVASIVE CV LAB;  Service: Cardiovascular;  Laterality: N/A;  . none      Current Outpatient Medications  Medication Sig Dispense Refill  . acetaminophen (TYLENOL) 325 MG tablet Take 650 mg by mouth every 6 (six) hours as needed for mild pain.    Marland Kitchen apixaban (ELIQUIS) 5 MG TABS tablet Take 1 tablet (5 mg total) by mouth 2 (two) times daily. 60 tablet 1  . calcium carbonate (TUMS - DOSED IN MG ELEMENTAL CALCIUM) 500 MG chewable tablet Chew 2 tablets by mouth daily as needed for indigestion or heartburn.    . diltiazem (CARDIZEM CD) 180 MG 24 hr capsule Take 1 capsule (180 mg total) by mouth daily. 60 capsule 6  . diltiazem (CARDIZEM)  30 MG tablet Take 1 tablet (30 mg total) by mouth 3 (three) times daily as needed (for palpitations with heart rate > 110 despite taking the extended release medicine). 30 tablet 3  . flecainide (TAMBOCOR) 50 MG tablet Take 1 tablet (50 mg total) by mouth 2 (two) times daily. 60 tablet 3  . furosemide (LASIX) 20 MG tablet Take 1 tablet (20 mg total) by mouth daily. May take extra tablet daily for weight gain/swelling 40 tablet 0  . methimazole (TAPAZOLE) 10 MG tablet Take 1 tablet (10 mg total)  by mouth 2 (two) times daily. 60 tablet 0  . Multiple Vitamins-Minerals (ONE-A-DAY VITACRAVES ADULT PO) Take 1 tablet by mouth daily.    . potassium chloride (KLOR-CON) 10 MEQ tablet Take 1 tablet (10 mEq total) by mouth daily. 30 tablet 3  . propranolol (INDERAL) 20 MG tablet Take 1 tablet (20 mg total) by mouth 3 (three) times daily. 60 tablet 0   No current facility-administered medications for this encounter.    No Known Allergies  Social History   Socioeconomic History  . Marital status: Married    Spouse name: Not on file  . Number of children: 6  . Years of education: Not on file  . Highest education level: Not on file  Occupational History  . Not on file  Tobacco Use  . Smoking status: Current Some Day Smoker    Types: Cigars  . Smokeless tobacco: Former Neurosurgeon    Types: Snuff  . Tobacco comment: once a year  Vaping Use  . Vaping Use: Never used  Substance and Sexual Activity  . Alcohol use: Not Currently  . Drug use: Never  . Sexual activity: Not on file  Other Topics Concern  . Not on file  Social History Narrative   Married.   6 children.   Works for the [employer].   Lives in [place].   Enjoys hunting and fishing.    Social Determinants of Health   Financial Resource Strain: Not on file  Food Insecurity: Not on file  Transportation Needs: Not on file  Physical Activity: Not on file  Stress: Not on file  Social Connections: Not on file  Intimate Partner Violence: Not on file    Family History  Problem Relation Age of Onset  . Hyperlipidemia Mother   . Heart disease Mother   . Diabetes Mother   . Hyperlipidemia Father   . Hypertension Sister   . Hyperlipidemia Sister     ROS- All systems are reviewed and negative except as per the HPI above  Physical Exam: Vitals:   03/29/20 0902  BP: (!) 120/58  Pulse: 69  Weight: 89.2 kg  Height: 5\' 8"  (1.727 m)   Wt Readings from Last 3 Encounters:  03/29/20 89.2 kg  03/28/20  87.3 kg  03/25/20 91.2 kg    Labs: Lab Results  Component Value Date   NA 131 (L) 03/28/2020   K 4.0 03/28/2020   CL 97 (L) 03/28/2020   CO2 26 03/28/2020   GLUCOSE 128 (H) 03/28/2020   BUN 25 (H) 03/28/2020   CREATININE 1.04 03/28/2020   CALCIUM 8.2 (L) 03/28/2020   PHOS 4.4 03/27/2020   MG 1.9 03/27/2020   Lab Results  Component Value Date   INR 1.6 (H) 03/25/2020   Lab Results  Component Value Date   CHOL 151 07/03/2016   HDL 45.20 07/03/2016   LDLCALC 92 07/03/2016   TRIG 70.0 07/03/2016    GEN- The  patient is a well appearing male, alert and oriented x 3 today.   HEENT-head normocephalic, atraumatic, sclera clear, conjunctiva pink, hearing intact, trachea midline. Lungs- Clear to ausculation bilaterally, normal work of breathing Heart- Regular rate and rhythm, no murmurs, rubs or gallops  GI- soft, NT, ND, + BS Extremities- no clubbing, cyanosis. Trace bilateral edema MS- no significant deformity or atrophy Skin- no rash or lesion Psych- euthymic mood, full affect Neuro- strength and sensation are intact   EKG-   SR Vent. rate 69 BPM PR interval 172 ms QRS duration 92 ms QT/QTc 432/462 ms   Assessment and Plan: 1. Paroxysmal afib/typical atrial flutter Patient appears to be maintaining SR. Continue flecainide 50 mg BID, intervals appropriate.  Continue diltiazem 180 mg Continue Eliquis 5 mg BID  2. CHA2DS2VASc score of 2(DM, HTN)  Continue Eliquis 5 mg BID  3. HTN Stable, no changes today.  4. Chronic diastolic CHF Improved, legs much less edematous.    Follow up with Elvin So and Dr Johney Frame as scheduled.    Jorja Loa PA-C [facility] [address] [place], [place] [address] [phone]

## 2020-03-28 NOTE — Progress Notes (Signed)
Heart Failure Stewardship Pharmacist Progress Note   PCP: Doreene Nest, NP PCP-Cardiologist: No primary care provider on file.    HPI:  50 yo Branch with PMH of atrial flutter, HTN, HLD, and obesity. He presented to the ED on 03/25/20 with palpitations, chest tightness, shortness of breath, LE edema, and orthopnea. An ECHO was done on 03/26/20 and LVEF was 50-55%. Converted to NSR on 03/26/20. Conerned that afib and HFpEF are driven by hyperthyroidism.  Discharge HF Medications:  Furosemide 20 mg daily  Prior to admission HF Medications: Furosemide 20 mg BID  Pertinent Lab Values: . Serum creatinine 0.38>0.95>1.04, BUN 25, Potassium 4.0, Sodium 131, BNP 886.5, Magnesium 1.9   Vital Signs: . Weight: 192 lbs (admission weight: 202 lbs) . Blood pressure: 110/60s  . Heart rate: 60s   Medication Assistance / Insurance Benefits Check: Does the patient have prescription insurance?  Yes Type of insurance plan: [employer] - commercial insurance  Outpatient Pharmacy:  [facility]  Assessment: 1. Acute diastolic CHF (EF 96-04%), thought to be driven by hyperthyroidism and afib. Stress test not yet completed - per cards will wait until outpatient to complete. NYHA class II symptoms. - Continue furosemide 20 mg daily at discharge - Consider starting Entresto 24/26 mg BID for new indication of LVEF <57% once BP and SCr allow - Consider starting Farxiga or Jardiance 10 mg daily for HFpEF once SCr allows (0.38>0.95>1.04 today)   Plan: 1) Medication changes recommended at this time: - Continue current regimen pending SCr improvement  2) Patient assistance: - Entresto copay $200 per month - can enroll in monthly copay card to lower to $10 per 1-3 month supply - Jardiance copay $200 per month - can enroll in monthly copay card to lower to $10 per 1-3 month supply - Farxiga copay $75 per month - can enroll in monthly copay card to lower to $0 per  month  Sharen Hones, PharmD, BCPS Heart Failure Stewardship Pharmacist Phone [phone]

## 2020-03-29 ENCOUNTER — Other Ambulatory Visit (HOSPITAL_COMMUNITY): Payer: Self-pay

## 2020-03-29 ENCOUNTER — Other Ambulatory Visit: Payer: Self-pay

## 2020-03-29 ENCOUNTER — Encounter (HOSPITAL_COMMUNITY): Payer: Self-pay | Admitting: Physician Assistant

## 2020-03-29 ENCOUNTER — Ambulatory Visit (HOSPITAL_COMMUNITY)
Admit: 2020-03-29 | Discharge: 2020-03-29 | Disposition: A | Discharge status: Home / Self Care | Payer: 59 | Attending: Physician Assistant | Admitting: Physician Assistant

## 2020-03-29 VITALS — BP 120/58 | HR 69 | Ht 68.0 in | Wt 196.6 lb

## 2020-03-29 DIAGNOSIS — Z8616 Personal history of COVID-19: Secondary | ICD-10-CM | POA: Diagnosis not present

## 2020-03-29 DIAGNOSIS — Z7901 Long term (current) use of anticoagulants: Secondary | ICD-10-CM | POA: Insufficient documentation

## 2020-03-29 DIAGNOSIS — I110 Hypertensive heart disease with heart failure: Secondary | ICD-10-CM | POA: Insufficient documentation

## 2020-03-29 DIAGNOSIS — I4892 Unspecified atrial flutter: Secondary | ICD-10-CM | POA: Insufficient documentation

## 2020-03-29 DIAGNOSIS — I5032 Chronic diastolic (congestive) heart failure: Secondary | ICD-10-CM | POA: Diagnosis not present

## 2020-03-29 DIAGNOSIS — I480 Paroxysmal atrial fibrillation: Secondary | ICD-10-CM | POA: Insufficient documentation

## 2020-03-29 DIAGNOSIS — F17290 Nicotine dependence, other tobacco product, uncomplicated: Secondary | ICD-10-CM | POA: Insufficient documentation

## 2020-03-29 DIAGNOSIS — D6869 Other thrombophilia: Secondary | ICD-10-CM | POA: Diagnosis not present

## 2020-03-29 DIAGNOSIS — E785 Hyperlipidemia, unspecified: Secondary | ICD-10-CM | POA: Insufficient documentation

## 2020-03-29 NOTE — Telephone Encounter (Signed)
Transition Care Management Unsuccessful Follow-up Telephone Call  Date of discharge and from where:  03/28/2020, [facility]   Attempts:  3rd Attempt  Reason for unsuccessful TCM follow-up call:  Voice mail full

## 2020-04-01 ENCOUNTER — Other Ambulatory Visit: Payer: 59

## 2020-04-02 NOTE — Progress Notes (Signed)
Name: Kevin Branch  MRN/ DOB: [id number], [birth year]    Age/ Sex: 50 y.o., male    PCP: Doreene Nest, NP   Reason for Endocrinology Evaluation: Hyperthyridism     Date of Initial Endocrinology Evaluation: 04/03/2020     HPI: [name] is a 50 y.o. male with a past medical history of A. Fib ( S/P Ablation ). The patient presented for initial endocrinology clinic visit on 04/03/2020 for consultative assistance with his Hyperthyroidism .   He has diagnosed with hyperthyroidism during his admission for recurrent A. Fib in 03/2020 with a suppressed TSH 0.000 uIU/mL and elevated FT4 at  > 5.5 ng/dL . He was started on B-blocker and Methimazole    Methimazole 10 mg BID  Follows with Cardiology - Dr. Rosemary Holms    He noted weight loss in 01/2020 Has had alternating bowel movement  No tremors  Continues with fatigue No sure if his neck is swollen but has SOb  Has slight burning of the eyes , has noted exomphalos ~ 2 months ago  No prior amiodarone use   Mother with thyroid disease    HISTORY:  Past Medical History:  Past Medical History:  Diagnosis Date  . Essential hypertension   . Hyperglycemia   . Hyperlipidemia   . Overweight   . Typical atrial flutter [facility])    Past Surgical History:  Past Surgical History:  Procedure Laterality Date  . A-FLUTTER ABLATION N/A 03/03/2019   Procedure: A-FLUTTER ABLATION;  Surgeon: Hillis Range, MD;  Location: [facility] INVASIVE CV LAB;  Service: Cardiovascular;  Laterality: N/A;  . none        Social History:  reports that he has been smoking cigars. He has quit using smokeless tobacco.  His smokeless tobacco use included snuff. He reports previous alcohol use. He reports that he does not use drugs.  Family History: family history includes Diabetes in his mother; Heart disease in his mother; Hyperlipidemia in his father, mother, and sister; Hypertension in his sister.   HOME MEDICATIONS: Allergies as of 04/03/2020   No  Known Allergies     Medication List       Accurate as of April 03, 2020 11:30 AM. If you have any questions, ask your nurse or doctor.        acetaminophen 325 MG tablet Commonly known as: TYLENOL Take 650 mg by mouth every 6 (six) hours as needed for mild pain.   apixaban 5 MG Tabs tablet Commonly known as: ELIQUIS Take 1 tablet (5 mg total) by mouth 2 (two) times daily.   calcium carbonate 500 MG chewable tablet Commonly known as: TUMS - dosed in mg elemental calcium Chew 2 tablets by mouth daily as needed for indigestion or heartburn.   diltiazem 180 MG 24 hr capsule Commonly known as: CARDIZEM CD Take 1 capsule (180 mg total) by mouth daily.   diltiazem 30 MG tablet Commonly known as: Cardizem Take 1 tablet (30 mg total) by mouth 3 (three) times daily as needed (for palpitations with heart rate > 110 despite taking the extended release medicine).   flecainide 50 MG tablet Commonly known as: TAMBOCOR Take 1 tablet (50 mg total) by mouth 2 (two) times daily.   furosemide 20 MG tablet Commonly known as: Lasix Take 1 tablet (20 mg total) by mouth daily. May take extra tablet daily for weight gain/swelling   methimazole 10 MG tablet Commonly known as: TAPAZOLE Take 1 tablet (10 mg total) by mouth  2 (two) times daily.   ONE-A-DAY VITACRAVES ADULT PO Take 1 tablet by mouth daily.   potassium chloride 10 MEQ tablet Commonly known as: KLOR-CON Take 1 tablet (10 mEq total) by mouth daily.   propranolol 20 MG tablet Commonly known as: INDERAL Take 1 tablet (20 mg total) by mouth 3 (three) times daily.         REVIEW OF SYSTEMS: A comprehensive ROS was conducted with the patient and is negative except as per HPI     OBJECTIVE:  VS: BP (!) 116/58 (BP Location: Left Arm, Patient Position: Sitting, Cuff Size: Normal)   Pulse 66   Resp 20   Ht 5\' 8"  (1.727 m)   Wt 205 lb 12.8 oz (93.4 kg)   SpO2 99%   BMI 31.29 kg/m    Wt Readings from Last 3 Encounters:   04/03/20 205 lb 12.8 oz (93.4 kg)  03/29/20 196 lb 9.6 oz (89.2 kg)  03/28/20 192 lb 6.4 oz (87.3 kg)     EXAM: General: Pt appears well and is in NAD  Eyes: External eye exam with stare, right mild exophthalmos but no lid lag.  EOM intact.   Neck: General: Supple without adenopathy. Thyroid: Thyroid size normal.  Questionable thyroid nodules appreciated . No thyroid bruit.  Lungs: Clear with good BS bilat with no rales, rhonchi, or wheezes  Heart: Auscultation: RRR.  Abdomen: Normoactive bowel sounds, soft, nontender, without masses or organomegaly palpable  Extremities:  BL LE: 1+ pretibial edema   Skin: Hair: Texture and amount normal with gender appropriate distribution Skin Inspection: No rashes Skin Palpation: Skin temperature, texture, and thickness normal to palpation  Neuro: Cranial nerves: II - XII grossly intact  Motor: Normal strength throughout DTRs: 2+ and symmetric in UE without delay in relaxation phase  Mental Status: Judgment, insight: Intact Orientation: Oriented to time, place, and person Mood and affect: No depression, anxiety, or agitation     DATA REVIEWED: [name], Branch" (MRN Penelope Coop) as of 04/05/2020 07:11  Ref. Range 04/03/2020 11:55  TSH Latest Ref Range: 0.35 - 4.50 uIU/mL <0.01 Repeated and verified X2. (L)  T4,Free(Direct) Latest Ref Range: 0.60 - 1.60 ng/dL 04/05/2020 (H)     [name], Branch" (MRN Penelope Coop) as of 04/03/2020 11:30  Ref. Range 03/27/2020 03:36  Sodium Latest Ref Range: 135 - 145 mmol/L 132 (L)  Potassium Latest Ref Range: 3.5 - 5.1 mmol/L 4.0  Chloride Latest Ref Range: 98 - 111 mmol/L 96 (L)  CO2 Latest Ref Range: 22 - 32 mmol/L 29  Glucose Latest Ref Range: 70 - 99 mg/dL 03/29/2020 (H)  BUN Latest Ref Range: 6 - 20 mg/dL 15  Creatinine Latest Ref Range: 0.61 - 1.24 mg/dL 767  Calcium Latest Ref Range: 8.9 - 10.3 mg/dL 8.3 (L)  Anion gap Latest Ref Range: 5 - 15  7  Phosphorus Latest Ref Range:  2.5 - 4.6 mg/dL 4.4  Magnesium Latest Ref Range: 1.7 - 2.4 mg/dL 1.9  Alkaline Phosphatase Latest Ref Range: 38 - 126 U/L 94  Albumin Latest Ref Range: 3.5 - 5.0 g/dL 2.3 (L)  AST Latest Ref Range: 15 - 41 U/L 54 (H)  ALT Latest Ref Range: 0 - 44 U/L 33  Total Protein Latest Ref Range: 6.5 - 8.1 g/dL 6.7  Total Bilirubin Latest Ref Range: 0.3 - 1.2 mg/dL 2.6 (H)  GFR, Estimated Latest Ref Range: >60 mL/min >60  [name], Branch" (MRN Penelope Coop)  as of 04/03/2020 11:30  Ref. Range 03/27/2020 03:36  WBC Latest Ref Range: 4.0 - 10.5 K/uL 12.0 (H)  RBC Latest Ref Range: 4.22 - 5.81 MIL/uL 3.65 (L)  Hemoglobin Latest Ref Range: 13.0 - 17.0 g/dL 82.4 (L)  HCT Latest Ref Range: 39.0 - 52.0 % 34.9 (L)  MCV Latest Ref Range: 80.0 - 100.0 fL 95.6  MCH Latest Ref Range: 26.0 - 34.0 pg 31.8  MCHC Latest Ref Range: 30.0 - 36.0 g/dL 23.5  RDW Latest Ref Range: 11.5 - 15.5 % 15.2  Platelets Latest Ref Range: 150 - 400 K/uL 261  nRBC Latest Ref Range: 0.0 - 0.2 % 0.0   ASSESSMENT/PLAN/RECOMMENDATIONS:   1. Hyperthyroidism :  - Discussed D/D of graves' disease ve toxic MNG  - NO local neck symptoms  - We discussed that Graves' Disease is a result of an autoimmune condition involving the thyroid.    We discussed with pt the benefits of methimazole in the Tx of hyperthyroidism, as well as the possible side effects/complications of anti-thyroid drug Tx (specifically detailing the rare, but serious side effect of agranulocytosis). He was informed of need for regular thyroid function monitoring while on methimazole to ensure appropriate dosage without over-treatment. As well, we discussed the possible side effects of methimazole including the chance of rash, the small chance of liver irritation/juandice and the <=1 in 300-400 chance of sudden onset agranulocytosis.  We discussed importance of going to ED promptly (and stopping methimazole) if hewere to develop significant fever with severe  sore throat of other evidence of acute infection.     We discussed appropriate activity/work limitations while pt is hyperthyroid.  Also recommended no driving until her hyperthyroidism improves.  He should not operate machinery or drive, should not handle explosives, other hazardous materials, firearms or high voltage, nor work at heights while hyperthyroid.  He should avoid heavy physical exertion and heat stress and should avoid work that requires high attention to detail while hyperthyroid.  We extensively discussed the various treatment options for hyperthyroidism and Graves disease including ablation therapy with radioactive iodine versus antithyroid drug treatment versus surgical therapy.  We recommended to the patient that we felt, at this time, that thionamide therapy would be most optimal.  We discussed the various possible benefits versus side effects of the various therapies.   - Repeat TFT's shows FT4 trending down, will continue current dose as below of methimazole    Medications : Continue Methimazole 10 mg BID     F/U in 3 months  Labs in 6 weeks     Signed electronically by: Lyndle Herrlich, MD  [facility] Endocrinology  [facility] [address] [place] [place], [place] [address] Phone: [phone] FAX: [phone]   CC: Doreene Nest, NP [address] [place] [address] Phone: [phone] Fax: [phone]   Return to Endocrinology clinic as below: Future Appointments  Date Time Provider Department Center  04/22/2020  9:45 AM Rayford Halsted, PA-C [facility] None  04/24/2020  3:30 PM Hillis Range, MD [facility]

## 2020-04-03 ENCOUNTER — Encounter: Payer: Self-pay | Admitting: Internal Medicine

## 2020-04-03 ENCOUNTER — Ambulatory Visit (INDEPENDENT_AMBULATORY_CARE_PROVIDER_SITE_OTHER): Payer: 59 | Admitting: Internal Medicine

## 2020-04-03 ENCOUNTER — Other Ambulatory Visit: Payer: Self-pay

## 2020-04-03 VITALS — BP 116/58 | HR 66 | Resp 20 | Ht 68.0 in | Wt 205.8 lb

## 2020-04-03 DIAGNOSIS — E0590 Thyrotoxicosis, unspecified without thyrotoxic crisis or storm: Secondary | ICD-10-CM

## 2020-04-03 LAB — TSH: TSH: <0.01 u[IU]/mL — ABNORMAL LOW (ref 0.35–4.50)

## 2020-04-03 LAB — T4, FREE: Free T4: 2.55 ng/dL — ABNORMAL HIGH (ref 0.60–1.60)

## 2020-04-03 NOTE — Patient Instructions (Signed)
We recommend that you follow these hyperthyroidism instructions at home:  1) Take Methimazole 10 mg Twice  a day  If you develop severe sore throat with high fevers OR develop unexplained yellowing of your skin, eyes, under your tongue, severe abdominal pain with nausea or vomiting --> then please get evaluated immediately.  2) Get repeat thyroid labs 6 weeks .   It is ESSENTIAL to get follow-up labs to help avoid over or undertreatment of your hyperthyroidism - both of which can be dangerous to your health.

## 2020-04-04 ENCOUNTER — Inpatient Hospital Stay (HOSPITAL_COMMUNITY)
Admission: EM | Admit: 2020-04-04 | Discharge: 2020-04-09 | DRG: 291 | Disposition: A | Discharge status: Home / Self Care | Payer: 59 | Attending: Internal Medicine | Admitting: Internal Medicine

## 2020-04-04 ENCOUNTER — Other Ambulatory Visit: Payer: Self-pay

## 2020-04-04 DIAGNOSIS — E785 Hyperlipidemia, unspecified: Secondary | ICD-10-CM | POA: Diagnosis present

## 2020-04-04 DIAGNOSIS — I509 Heart failure, unspecified: Secondary | ICD-10-CM

## 2020-04-04 DIAGNOSIS — I110 Hypertensive heart disease with heart failure: Principal | ICD-10-CM | POA: Diagnosis present

## 2020-04-04 DIAGNOSIS — U071 COVID-19: Secondary | ICD-10-CM | POA: Diagnosis present

## 2020-04-04 DIAGNOSIS — F17290 Nicotine dependence, other tobacco product, uncomplicated: Secondary | ICD-10-CM | POA: Diagnosis present

## 2020-04-04 DIAGNOSIS — R748 Abnormal levels of other serum enzymes: Secondary | ICD-10-CM

## 2020-04-04 DIAGNOSIS — Z8249 Family history of ischemic heart disease and other diseases of the circulatory system: Secondary | ICD-10-CM

## 2020-04-04 DIAGNOSIS — Z79899 Other long term (current) drug therapy: Secondary | ICD-10-CM

## 2020-04-04 DIAGNOSIS — I272 Pulmonary hypertension, unspecified: Secondary | ICD-10-CM | POA: Diagnosis present

## 2020-04-04 DIAGNOSIS — E0590 Thyrotoxicosis, unspecified without thyrotoxic crisis or storm: Secondary | ICD-10-CM | POA: Diagnosis present

## 2020-04-04 DIAGNOSIS — Z833 Family history of diabetes mellitus: Secondary | ICD-10-CM

## 2020-04-04 DIAGNOSIS — R601 Generalized edema: Secondary | ICD-10-CM

## 2020-04-04 DIAGNOSIS — Z7901 Long term (current) use of anticoagulants: Secondary | ICD-10-CM

## 2020-04-04 DIAGNOSIS — I480 Paroxysmal atrial fibrillation: Secondary | ICD-10-CM | POA: Diagnosis present

## 2020-04-04 DIAGNOSIS — I959 Hypotension, unspecified: Secondary | ICD-10-CM | POA: Diagnosis not present

## 2020-04-04 DIAGNOSIS — I5033 Acute on chronic diastolic (congestive) heart failure: Secondary | ICD-10-CM | POA: Diagnosis present

## 2020-04-04 DIAGNOSIS — I10 Essential (primary) hypertension: Secondary | ICD-10-CM | POA: Diagnosis present

## 2020-04-04 DIAGNOSIS — R0602 Shortness of breath: Secondary | ICD-10-CM | POA: Diagnosis not present

## 2020-04-04 DIAGNOSIS — Z83438 Family history of other disorder of lipoprotein metabolism and other lipidemia: Secondary | ICD-10-CM

## 2020-04-04 DIAGNOSIS — E119 Type 2 diabetes mellitus without complications: Secondary | ICD-10-CM | POA: Diagnosis present

## 2020-04-04 HISTORY — DX: Unspecified atrial fibrillation: I48.91

## 2020-04-04 NOTE — ED Triage Notes (Signed)
C/o bilateral leg swelling getting progressively worse that's making him short of breath, abdominal swelling and groin swelling. Hx afib and on eliquis.  Per pt, he is supposed to have cardioversion but has been put off because of elevated thyroid levels.

## 2020-04-05 ENCOUNTER — Observation Stay (HOSPITAL_COMMUNITY): Payer: 59

## 2020-04-05 ENCOUNTER — Emergency Department (HOSPITAL_COMMUNITY): Payer: 59

## 2020-04-05 ENCOUNTER — Encounter (HOSPITAL_COMMUNITY): Payer: Self-pay

## 2020-04-05 ENCOUNTER — Other Ambulatory Visit: Payer: Self-pay

## 2020-04-05 DIAGNOSIS — I10 Essential (primary) hypertension: Secondary | ICD-10-CM | POA: Diagnosis not present

## 2020-04-05 DIAGNOSIS — E0590 Thyrotoxicosis, unspecified without thyrotoxic crisis or storm: Secondary | ICD-10-CM | POA: Diagnosis not present

## 2020-04-05 DIAGNOSIS — I509 Heart failure, unspecified: Secondary | ICD-10-CM

## 2020-04-05 DIAGNOSIS — U071 COVID-19: Secondary | ICD-10-CM

## 2020-04-05 DIAGNOSIS — I480 Paroxysmal atrial fibrillation: Secondary | ICD-10-CM

## 2020-04-05 DIAGNOSIS — E119 Type 2 diabetes mellitus without complications: Secondary | ICD-10-CM

## 2020-04-05 LAB — BASIC METABOLIC PANEL
Anion gap: 7 (ref 5–15)
BUN: 6 mg/dL (ref 6–20)
CO2: 27 mmol/L (ref 22–32)
Calcium: 8.2 mg/dL — ABNORMAL LOW (ref 8.9–10.3)
Chloride: 100 mmol/L (ref 98–111)
Creatinine, Ser: 0.56 mg/dL — ABNORMAL LOW (ref 0.61–1.24)
GFR, Estimated: >60 mL/min (ref >60)
Glucose, Bld: 153 mg/dL — ABNORMAL HIGH (ref 70–99)
Potassium: 3.6 mmol/L (ref 3.5–5.1)
Sodium: 134 mmol/L — ABNORMAL LOW (ref 135–145)

## 2020-04-05 LAB — URINALYSIS, COMPLETE (UACMP) WITH MICROSCOPIC
Bacteria, UA: NONE SEEN
Bilirubin Urine: NEGATIVE
Glucose, UA: NEGATIVE mg/dL
Hgb urine dipstick: NEGATIVE
Ketones, ur: NEGATIVE mg/dL
Leukocytes,Ua: NEGATIVE
Nitrite: NEGATIVE
Protein, ur: NEGATIVE mg/dL
Specific Gravity, Urine: 1.01 (ref 1.005–1.030)
pH: 6 (ref 5.0–8.0)

## 2020-04-05 LAB — HEPATIC FUNCTION PANEL
ALT: 25 U/L (ref 0–44)
AST: 44 U/L — ABNORMAL HIGH (ref 15–41)
Albumin: 2.4 g/dL — ABNORMAL LOW (ref 3.5–5.0)
Alkaline Phosphatase: 99 U/L (ref 38–126)
Bilirubin, Direct: 0.9 mg/dL — ABNORMAL HIGH (ref 0.0–0.2)
Indirect Bilirubin: 0.8 mg/dL (ref 0.3–0.9)
Total Bilirubin: 1.7 mg/dL — ABNORMAL HIGH (ref 0.3–1.2)
Total Protein: 6.6 g/dL (ref 6.5–8.1)

## 2020-04-05 LAB — CBC
HCT: 35.2 % — ABNORMAL LOW (ref 39.0–52.0)
Hemoglobin: 11.7 g/dL — ABNORMAL LOW (ref 13.0–17.0)
MCH: 32.1 pg (ref 26.0–34.0)
MCHC: 33.2 g/dL (ref 30.0–36.0)
MCV: 96.7 fL (ref 80.0–100.0)
Platelets: 244 10*3/uL (ref 150–400)
RBC: 3.64 MIL/uL — ABNORMAL LOW (ref 4.22–5.81)
RDW: 14.9 % (ref 11.5–15.5)
WBC: 9.3 10*3/uL (ref 4.0–10.5)
nRBC: 0 % (ref 0.0–0.2)

## 2020-04-05 LAB — RESP PANEL BY RT-PCR (FLU A&B, COVID) ARPGX2
Influenza A by PCR: NEGATIVE
Influenza B by PCR: NEGATIVE
SARS Coronavirus 2 by RT PCR: POSITIVE — AB

## 2020-04-05 LAB — BRAIN NATRIURETIC PEPTIDE: B Natriuretic Peptide: 1821 pg/mL — ABNORMAL HIGH (ref 0.0–100.0)

## 2020-04-05 LAB — C-REACTIVE PROTEIN: CRP: 0.9 mg/dL (ref <1.0)

## 2020-04-05 LAB — GLUCOSE, CAPILLARY
Glucose-Capillary: 84 mg/dL (ref 70–99)
Glucose-Capillary: 156 mg/dL — ABNORMAL HIGH (ref 70–99)
Glucose-Capillary: 110 mg/dL — ABNORMAL HIGH (ref 70–99)
Glucose-Capillary: 152 mg/dL — ABNORMAL HIGH (ref 70–99)

## 2020-04-05 LAB — PROTIME-INR
INR: 1.6 — ABNORMAL HIGH (ref 0.8–1.2)
Prothrombin Time: 18.7 seconds — ABNORMAL HIGH (ref 11.4–15.2)

## 2020-04-05 LAB — PROTEIN / CREATININE RATIO, URINE
Creatinine, Urine: 72.89 mg/dL
Protein Creatinine Ratio: 0.18 mg/mg{Cre} — ABNORMAL HIGH (ref 0.00–0.15)
Total Protein, Urine: 13 mg/dL

## 2020-04-05 LAB — TROPONIN I (HIGH SENSITIVITY)
Troponin I (High Sensitivity): 16 ng/L (ref <18)
Troponin I (High Sensitivity): 14 ng/L (ref <18)

## 2020-04-05 LAB — D-DIMER, QUANTITATIVE: D-Dimer, Quant: 3.55 ug/mL-FEU — ABNORMAL HIGH (ref 0.00–0.50)

## 2020-04-05 LAB — PROCALCITONIN: Procalcitonin: <0.1 ng/mL

## 2020-04-05 MED ORDER — METHIMAZOLE 5 MG PO TABS
10.0000 mg | ORAL_TABLET | Freq: Two times a day (BID) | ORAL | Status: DC
  Administered 2020-04-05 – 2020-04-09 (×9): 10 mg via ORAL
  Filled 2020-04-05 (×7): qty 2
  Filled 2020-04-05: qty 1
  Filled 2020-04-05 (×2): qty 2

## 2020-04-05 MED ORDER — FUROSEMIDE 10 MG/ML IJ SOLN
40.0000 mg | Freq: Two times a day (BID) | INTRAMUSCULAR | Status: DC
  Administered 2020-04-05 – 2020-04-07 (×6): 40 mg via INTRAVENOUS
  Filled 2020-04-05 (×6): qty 4

## 2020-04-05 MED ORDER — ASCORBIC ACID 500 MG PO TABS
500.0000 mg | ORAL_TABLET | Freq: Every day | ORAL | Status: DC
  Administered 2020-04-05 – 2020-04-09 (×5): 500 mg via ORAL
  Filled 2020-04-05 (×5): qty 1

## 2020-04-05 MED ORDER — ONDANSETRON HCL 4 MG/2ML IJ SOLN: 4.0000 mg | Freq: Four times a day (QID) | INTRAMUSCULAR | Status: DC | PRN

## 2020-04-05 MED ORDER — ZINC SULFATE 220 (50 ZN) MG PO CAPS
220.0000 mg | ORAL_CAPSULE | Freq: Every day | ORAL | Status: DC
  Administered 2020-04-05 – 2020-04-09 (×5): 220 mg via ORAL
  Filled 2020-04-05 (×5): qty 1

## 2020-04-05 MED ORDER — APIXABAN 5 MG PO TABS
5.0000 mg | ORAL_TABLET | Freq: Two times a day (BID) | ORAL | Status: DC
  Administered 2020-04-05 – 2020-04-09 (×9): 5 mg via ORAL
  Filled 2020-04-05 (×9): qty 1

## 2020-04-05 MED ORDER — DILTIAZEM HCL ER COATED BEADS 180 MG PO CP24
180.0000 mg | ORAL_CAPSULE | Freq: Every day | ORAL | Status: DC
  Administered 2020-04-05 – 2020-04-09 (×5): 180 mg via ORAL
  Filled 2020-04-05 (×6): qty 1

## 2020-04-05 MED ORDER — PROPRANOLOL HCL 20 MG PO TABS
20.0000 mg | ORAL_TABLET | Freq: Three times a day (TID) | ORAL | Status: DC
  Administered 2020-04-05 – 2020-04-09 (×10): 20 mg via ORAL
  Filled 2020-04-05 (×17): qty 1

## 2020-04-05 MED ORDER — GUAIFENESIN-DM 100-10 MG/5ML PO SYRP: 10.0000 mL | ORAL_SOLUTION | ORAL | Status: DC | PRN

## 2020-04-05 MED ORDER — SODIUM CHLORIDE 0.9% FLUSH
3.0000 mL | INTRAVENOUS | Status: DC | PRN
  Administered 2020-04-05: 3 mL via INTRAVENOUS

## 2020-04-05 MED ORDER — METHIMAZOLE 10 MG PO TABS: 10.0000 mg | ORAL_TABLET | Freq: Two times a day (BID) | ORAL | 1 refills | Status: DC

## 2020-04-05 MED ORDER — SODIUM CHLORIDE 0.9 % IV SOLN
100.0000 mg | Freq: Every day | INTRAVENOUS | Status: AC
  Administered 2020-04-06: 100 mg via INTRAVENOUS
  Filled 2020-04-05 (×4): qty 20

## 2020-04-05 MED ORDER — SODIUM CHLORIDE 0.9 % IV SOLN: 250.0000 mL | INTRAVENOUS | Status: DC | PRN

## 2020-04-05 MED ORDER — SODIUM CHLORIDE 0.9% FLUSH
3.0000 mL | Freq: Two times a day (BID) | INTRAVENOUS | Status: DC
  Administered 2020-04-05 – 2020-04-09 (×9): 3 mL via INTRAVENOUS

## 2020-04-05 MED ORDER — FUROSEMIDE 10 MG/ML IJ SOLN
40.0000 mg | Freq: Once | INTRAMUSCULAR | Status: AC
  Administered 2020-04-05: 40 mg via INTRAVENOUS
  Filled 2020-04-05: qty 4

## 2020-04-05 MED ORDER — FLECAINIDE ACETATE 50 MG PO TABS
50.0000 mg | ORAL_TABLET | Freq: Two times a day (BID) | ORAL | Status: DC
  Administered 2020-04-05 – 2020-04-09 (×9): 50 mg via ORAL
  Filled 2020-04-05 (×10): qty 1

## 2020-04-05 MED ORDER — ACETAMINOPHEN 325 MG PO TABS
650.0000 mg | ORAL_TABLET | ORAL | Status: DC | PRN
  Administered 2020-04-06 – 2020-04-08 (×3): 650 mg via ORAL
  Filled 2020-04-05 (×3): qty 2

## 2020-04-05 MED ORDER — INSULIN ASPART 100 UNIT/ML ~~LOC~~ SOLN
0.0000 [IU] | Freq: Three times a day (TID) | SUBCUTANEOUS | Status: DC
  Administered 2020-04-06 – 2020-04-08 (×4): 2 [IU] via SUBCUTANEOUS

## 2020-04-05 MED ORDER — ALBUTEROL SULFATE HFA 108 (90 BASE) MCG/ACT IN AERS
2.0000 | INHALATION_SPRAY | RESPIRATORY_TRACT | Status: DC | PRN
  Filled 2020-04-05: qty 6.7

## 2020-04-05 MED ORDER — SODIUM CHLORIDE 0.9 % IV SOLN
200.0000 mg | Freq: Once | INTRAVENOUS | Status: AC
  Administered 2020-04-05: 200 mg via INTRAVENOUS
  Filled 2020-04-05: qty 40

## 2020-04-05 NOTE — Progress Notes (Signed)
50 year old male with past medical history of atrial flutter status post ablation 2/21, COVID-19 infection (01/2020), diabetes mellitus type 2, hyperlipidemia, hypertension and recent diagnosis of hyperthyroidism currently on methimazole who presents to [facility] emergency department with complaints of bilateral lower extremity edema, increasing abdominal girth and scrotal edema. He was admitted for CHF, started on IV lasix 40 mg bid.  Pt admitted by Dr Leafy Half earlier this am, please see his note for detailed H&P.  Cardiology consulted, Dr Rosemary Holms will see the patient in am.   Kathlen Mody, MD

## 2020-04-05 NOTE — ED Notes (Signed)
Patient transported to Ultrasound 

## 2020-04-05 NOTE — ED Notes (Signed)
Pt returned from ultrasound at this time.

## 2020-04-05 NOTE — Progress Notes (Signed)
Heart Failure Stewardship Pharmacist Progress Note   PCP: Doreene Nest, NP PCP-Cardiologist: No primary care provider on file.    HPI:  50 yo M with PMH of atrial flutter, HTN, HLD, and obesity. He was recently hospitalized from 03/25/20-03/28/20 with atrial flutter with RVR. An ECHO was done on 03/26/20 and LVEF was 50-55%. He converted to NSR on 03/26/20. Since discharge, he has reported worsening bilateral LE edema, orthopnea, and weight gain and presented back to the ED on 04/05/20. He has been readmitted for acute CHF.   Current HF Medications: Furosemide 40 mg IV BID  Prior to admission HF Medications: Furosemide 20 mg daily  Pertinent Lab Values: . Serum creatinine 0.56, BUN 6, Potassium 3.6, Sodium 134, BNP 1821  Vital Signs: . Weight: 204 lbs (admission weight: 204 lbs) . Blood pressure: 110/60s  . Heart rate: 60s   Medication Assistance / Insurance Benefits Check: Does the patient have prescription insurance?  Yes Type of insurance plan: [employer] - commercial insurance  Outpatient Pharmacy:  [facility] Is the patient willing to use [facility] TOC pharmacy at discharge? Yes Is the patient willing to transition their outpatient pharmacy to utilize a [facility] outpatient pharmacy?   Pending    Assessment: 1. Acute on chronic diastolic CHF (EF 58-85%), thought to be driven by hyperthyroidism and afib. NYHA class II symptoms. - Continue furosemide 40 mg IV BID - Consider starting Entresto 24/26 mg BID for new indication of LVEF <57% once BP allows - Consider starting Farxiga or Jardiance 10 mg daily for HFpEF after further IV diuresis   Plan: 1) Medication changes recommended at this time: - Continue IV diuresis - Will need optimization of GDMT prior to discharge to avoid another readmission  2) Patient assistance: - Entresto copay $200 per month - can enroll in monthly copay card to lower to $10 per 1-3 month supply - Jardiance  copay $200 per month - can enroll in monthly copay card to lower to $10 per 1-3 month supply - Farxiga copay $75 per month - can enroll in monthly copay card to lower to $0 per month  3)  Education  - To be completed prior to discharge  Sharen Hones, PharmD, BCPS Heart Failure Stewardship Pharmacist Phone [phone]

## 2020-04-05 NOTE — ED Provider Notes (Signed)
[facility] EMERGENCY DEPARTMENT Provider Note   CSN: [id number] Arrival date & time: 04/04/20  2348     History Chief Complaint  Patient presents with  . Leg Swelling  . Edema  . Shortness of Breath    Kevin Branch is a 50 y.o. male.  Patient with history of paroxysmal atrial fibrillation, on anticoagulation, recent diagnosis of hyperthyroidism currently on methimazole and propranolol --presents the emergency department today for worsening swelling.  Patient was recently in the hospital for CHF related to his atrial fibrillation.  He was treated with IV Lasix with improvement.  He states that since being discharged on 3/17, he has gained about 12 pounds.  He has had worsening swelling in the bilateral lower extremities and now into his scrotum.  He has developed a cough over the past couple of days.  He has been compliant with his medications.  Symptoms became severe enough, prompting emergency department visit tonight.  No fevers, chest pain.  Shortness of breath is worse with exertion.  He is able to lie flat reasonably well.  Currently on Lasix 20 mg daily.  The onset of this condition was acute. The course is constant. Aggravating factors: activity. Alleviating factors: none.          Past Medical History:  Diagnosis Date  . A-fib (HCC)   . Essential hypertension   . Hyperglycemia   . Hyperlipidemia   . Overweight   . Typical atrial flutter [facility])     Patient Active Problem List   Diagnosis Date Noted  . Atrial flutter with rapid ventricular response (HCC) 03/26/2020  . Acute CHF (congestive heart failure) (HCC) 03/26/2020  . Paroxysmal atrial fibrillation (HCC) 03/14/2020  . Secondary hypercoagulable state (HCC) 03/14/2020  . Screening cholesterol level 02/22/2019  . Typical atrial flutter (HCC) 02/22/2019  . Hyperlipidemia 07/03/2016  . Essential hypertension 04/08/2016  . Type 2 diabetes mellitus (HCC) 04/08/2016    Past Surgical History:   Procedure Laterality Date  . A-FLUTTER ABLATION N/A 03/03/2019   Procedure: A-FLUTTER ABLATION;  Surgeon: Hillis Range, MD;  Location: [facility] INVASIVE CV LAB;  Service: Cardiovascular;  Laterality: N/A;  . none         Family History  Problem Relation Age of Onset  . Hyperlipidemia Mother   . Heart disease Mother   . Diabetes Mother   . Hyperlipidemia Father   . Hypertension Sister   . Hyperlipidemia Sister     Social History   Tobacco Use  . Smoking status: Current Some Day Smoker    Types: Cigars  . Smokeless tobacco: Former Neurosurgeon    Types: Snuff  . Tobacco comment: once a year  Vaping Use  . Vaping Use: Never used  Substance Use Topics  . Alcohol use: Not Currently  . Drug use: Never    Home Medications Prior to Admission medications   Medication Sig Start Date End Date Taking? Authorizing Provider  acetaminophen (TYLENOL) 325 MG tablet Take 650 mg by mouth every 6 (six) hours as needed for mild pain.    [provider]  apixaban (ELIQUIS) 5 MG TABS tablet Take 1 tablet (5 mg total) by mouth 2 (two) times daily. 03/05/20   Graciella Freer, PA-C  calcium carbonate (TUMS - DOSED IN MG ELEMENTAL CALCIUM) 500 MG chewable tablet Chew 2 tablets by mouth daily as needed for indigestion or heartburn.    [provider]  diltiazem (CARDIZEM CD) 180 MG 24 hr capsule Take 1 capsule (180  mg total) by mouth daily. 03/14/20   Fenton, Clint R, PA  diltiazem (CARDIZEM) 30 MG tablet Take 1 tablet (30 mg total) by mouth 3 (three) times daily as needed (for palpitations with heart rate > 110 despite taking the extended release medicine). 03/05/20   Graciella Freer, PA-C  flecainide (TAMBOCOR) 50 MG tablet Take 1 tablet (50 mg total) by mouth 2 (two) times daily. 03/14/20   Fenton, Clint R, PA  furosemide (LASIX) 20 MG tablet Take 1 tablet (20 mg total) by mouth daily. May take extra tablet daily for weight gain/swelling 03/28/20   Zannie Cove, MD  methimazole  (TAPAZOLE) 10 MG tablet Take 1 tablet (10 mg total) by mouth 2 (two) times daily. 03/28/20   Zannie Cove, MD  Multiple Vitamins-Minerals (ONE-A-DAY VITACRAVES ADULT PO) Take 1 tablet by mouth daily.    [provider]  potassium chloride (KLOR-CON) 10 MEQ tablet Take 1 tablet (10 mEq total) by mouth daily. 03/18/20 06/16/20  Fenton, Clint R, PA  propranolol (INDERAL) 20 MG tablet Take 1 tablet (20 mg total) by mouth 3 (three) times daily. 03/28/20   Zannie Cove, MD    Allergies    Patient has no known allergies.  Review of Systems   Review of Systems  Constitutional: Positive for unexpected weight change. Negative for diaphoresis and fever.  Eyes: Negative for redness.  Respiratory: Positive for cough and shortness of breath.   Cardiovascular: Positive for leg swelling. Negative for chest pain and palpitations.  Gastrointestinal: Negative for abdominal pain, nausea and vomiting.  Genitourinary: Positive for genital sores (skin breakdown) and scrotal swelling. Negative for dysuria.  Musculoskeletal: Negative for back pain and neck pain.  Skin: Negative for rash.  Neurological: Negative for syncope and light-headedness.  Psychiatric/Behavioral: The patient is not nervous/anxious.     Physical Exam Updated Vital Signs BP (!) 113/56   Pulse (!) 57   Temp 98.8 F (37.1 C) (Oral)   Resp (!) 24   Ht 5\' 8"  (1.727 m)   Wt 92.5 kg   SpO2 98%   BMI 31.02 kg/m   Physical Exam Vitals and nursing note reviewed.  Constitutional:      Appearance: He is well-developed. He is not diaphoretic.  HENT:     Head: Normocephalic and atraumatic.     Mouth/Throat:     Mouth: Mucous membranes are not dry.  Eyes:     Conjunctiva/sclera: Conjunctivae normal.  Neck:     Vascular: Normal carotid pulses. No carotid bruit or JVD.     Trachea: Trachea normal. No tracheal deviation.  Cardiovascular:     Rate and Rhythm: Normal rate and regular rhythm.     Pulses: No decreased pulses.      Heart sounds: Normal heart sounds, S1 normal and S2 normal. Heart sounds not distant. No murmur heard.   Pulmonary:     Effort: Pulmonary effort is normal. No respiratory distress.     Breath sounds: Examination of the right-lower field reveals decreased breath sounds. Decreased breath sounds present. No wheezing, rhonchi or rales.  Chest:     Chest wall: No tenderness.  Abdominal:     General: Bowel sounds are normal.     Palpations: Abdomen is soft.     Tenderness: There is no abdominal tenderness. There is no guarding or rebound.  Musculoskeletal:     Cervical back: Normal range of motion and neck supple. No muscular tenderness.     Right lower leg: Edema present.  Left lower leg: Edema present.     Comments: Patient with 2+ edema, symmetrically of the bilateral lower extremities to the knees, 1+ of the thighs.  There is edema of the scrotum and skin breakdown of the scrotum and legs due to chafing.  No obvious signs of cellulitis.  Skin:    General: Skin is warm and dry.     Coloration: Skin is not pale.  Neurological:     Mental Status: He is alert.     ED Results / Procedures / Treatments   Labs (all labs ordered are listed, but only abnormal results are displayed) Labs Reviewed  CBC - Abnormal; Notable for the following components:      Result Value   RBC 3.64 (*)    Hemoglobin 11.7 (*)    HCT 35.2 (*)    All other components within normal limits  BASIC METABOLIC PANEL  PROTIME-INR  BRAIN NATRIURETIC PEPTIDE  TROPONIN I (HIGH SENSITIVITY)    ED ECG REPORT   Date: 04/05/2020  Rate: 59  Rhythm: sinus bradycardia  QRS Axis: normal  Intervals: normal  ST/T Wave abnormalities: nonspecific T wave changes  Conduction Disutrbances:none  Narrative Interpretation:   Old EKG Reviewed: unchanged from 3/18 except slightly slower  I have personally reviewed the EKG tracing and agree with the computerized printout as noted.  Radiology DG Chest 2 View  Result Date:  04/05/2020 CLINICAL DATA:  Shortness of breath, leg swelling EXAM: CHEST - 2 VIEW COMPARISON:  03/25/2020 FINDINGS: Low volumes and atelectasis. Some additional hazy interstitial opacity and pulmonary vascular cephalization could reflect early interstitial edema with a layering right pleural effusion. No visible left effusion. No pneumothorax. Cardiac silhouette is slightly enlarged from comparison PA radiograph. Remaining cardiomediastinal contours are unremarkable. Degenerative changes are present in the imaged spine and shoulders. IMPRESSION: Low volumes and atelectasis. Some additional hazy interstitial opacity and pulmonary vascular cephalization could reflect early interstitial edema with a layering right pleural effusion. Slightly increased prominence of the cardiac silhouette. Possible cardiomegaly versus pericardial effusion. Electronically Signed   By: Kreg ShropshirePrice  DeHay M.D.   On: 04/05/2020 00:31    Procedures Procedures   Medications Ordered in ED Medications  furosemide (LASIX) injection 40 mg (40 mg Intravenous Given 04/05/20 0153)    ED Course  I have reviewed the triage vital signs and the nursing notes.  Pertinent labs & imaging results that were available during my care of the patient were reviewed by me and considered in my medical decision making (see chart for details).  Patient seen and examined. Work-up initiated. Medications ordered. Will likely need cardiology input.   Vital signs reviewed and are as follows: BP (!) 113/56   Pulse (!) 57   Temp 98.8 F (37.1 C) (Oral)   Resp (!) 24   Ht 5\' 8"  (1.727 m)   Wt 92.5 kg   SpO2 98%   BMI 31.02 kg/m   3:59 AM Patient updated on results. Will need admission for diuresis. Discussed with Dr. Blinda LeatherwoodPollina.   Spoke with Dr. Leafy HalfShalhoub who will see.     MDM Rules/Calculators/A&P                          Admit.   Final Clinical Impression(s) / ED Diagnoses Final diagnoses:  Anasarca  Acute on chronic congestive heart failure,  unspecified heart failure type (HCC)    Rx / DC Orders ED Discharge Orders    None  Renne Crigler, PA-C 04/05/20 0400    Gilda Crease, MD 04/05/20 [phone]

## 2020-04-05 NOTE — H&P (Signed)
History and Physical    Kevin Branch:[id number] DOB: 1970-05-10 DOA: 04/04/2020  PCP: Doreene Nest, NP  Patient coming from: Home   Chief Complaint:  Chief Complaint  Patient presents with  . Leg Swelling  . Edema  . Shortness of Breath     HPI:    50 year old male with past medical history of atrial flutter status post ablation 2/21, COVID-19 infection (01/2020), diabetes mellitus type 2, hyperlipidemia, hypertension and recent diagnosis of hyperthyroidism currently on methimazole who presents to [facility] emergency department with complaints of bilateral lower extremity edema, increasing abdominal girth and scrotal edema.  Of note, patient was hospitalized 3/15 through 3/17 for recurrent atrial flutter with rapid ventricular response.  During that hospitalization the patient was found to be in thyrotoxicosis with concurrent acute congestive heart failure, felt to be possibly high-output in nature secondary to the patient's thyroid disease.  Patient was managed with initiation of methimazole, propranolol and diuresed with intravenous furosemide and was discharged on 3/17 with a regimen of Lasix 20 mg p.o. daily.  Patient explains that at the time he was discharged home he felt symptomatically improved although not quite at baseline.  The days that followed, he states that despite taking all of his prescribed medications he began to develop progressively worsening bilateral lower extremity swelling.  The swelling continued to progress over the next several days and became associated with increasing scrotal edema and increasing abdominal girth.  Patient also reports that he weighed himself occasionally over the span of time and noted that his weight was increasing although he seems to be somewhat unsure as to how much weight he gained.  Patient also complains of associated pillow orthopnea over the span of time.  Patient also complains of associated occasional cough with  clear sputum.  Over the span of time as well.    Patient denies any associated bouts of palpitations chest pain or fever.  Patient denies myalgias, change in taste or smell, sick contacts or confirmed contact with COVID-19 infection.  Due to patient's progressively worsening symptoms he eventually presented back to [facility] emergency department.  Upon evaluation in the emergency department patient clinically was found to have extensive peripheral edema including lower extremity edema up to the lower back and scrotal swelling.  Patient was found to have a markedly elevated BNP of 1821, increased from 886 during his recent hospitalization.  Patient was also incidentally found to be Covid positive.  Patient was given 40 mg of IV Lasix and the hospitalist group was then called to assess the patient for admission to the hospital.  Review of Systems:   Review of Systems  Respiratory: Positive for cough and shortness of breath.   Cardiovascular: Positive for orthopnea and leg swelling.  All other systems reviewed and are negative.   Past Medical History:  Diagnosis Date  . A-fib (HCC)   . Essential hypertension   . Hyperglycemia   . Hyperlipidemia   . Overweight   . Typical atrial flutter [facility])     Past Surgical History:  Procedure Laterality Date  . A-FLUTTER ABLATION N/A 03/03/2019   Procedure: A-FLUTTER ABLATION;  Surgeon: Hillis Range, MD;  Location: [facility] INVASIVE CV LAB;  Service: Cardiovascular;  Laterality: N/A;  . none       reports that he has been smoking cigars. He has quit using smokeless tobacco.  His smokeless tobacco use included snuff. He reports previous alcohol use. He reports that he does not use drugs.  No Known Allergies  Family History  Problem Relation Age of Onset  . Hyperlipidemia Mother   . Heart disease Mother   . Diabetes Mother   . Hyperlipidemia Father   . Hypertension Sister   . Hyperlipidemia Sister      Prior to Admission medications    Medication Sig Start Date End Date Taking? Authorizing Provider  acetaminophen (TYLENOL) 325 MG tablet Take 650 mg by mouth every 6 (six) hours as needed for mild pain.   Yes [provider]  apixaban (ELIQUIS) 5 MG TABS tablet Take 1 tablet (5 mg total) by mouth 2 (two) times daily. 03/05/20  Yes Graciella Freer, PA-C  calcium carbonate (TUMS - DOSED IN MG ELEMENTAL CALCIUM) 500 MG chewable tablet Chew 2 tablets by mouth daily as needed for indigestion or heartburn.   Yes [provider]  diltiazem (CARDIZEM CD) 180 MG 24 hr capsule Take 1 capsule (180 mg total) by mouth daily. 03/14/20  Yes Fenton, Clint R, PA  diltiazem (CARDIZEM) 30 MG tablet Take 1 tablet (30 mg total) by mouth 3 (three) times daily as needed (for palpitations with heart rate > 110 despite taking the extended release medicine). 03/05/20  Yes Graciella Freer, PA-C  flecainide (TAMBOCOR) 50 MG tablet Take 1 tablet (50 mg total) by mouth 2 (two) times daily. 03/14/20  Yes Fenton, Clint R, PA  furosemide (LASIX) 20 MG tablet Take 1 tablet (20 mg total) by mouth daily. May take extra tablet daily for weight gain/swelling 03/28/20  Yes Zannie Cove, MD  methimazole (TAPAZOLE) 10 MG tablet Take 1 tablet (10 mg total) by mouth 2 (two) times daily. 03/28/20  Yes Zannie Cove, MD  Multiple Vitamins-Minerals (ONE-A-DAY VITACRAVES ADULT PO) Take 1 tablet by mouth daily.   Yes [provider]  potassium chloride (KLOR-CON) 10 MEQ tablet Take 1 tablet (10 mEq total) by mouth daily. 03/18/20 06/16/20 Yes Fenton, Clint R, PA  propranolol (INDERAL) 20 MG tablet Take 1 tablet (20 mg total) by mouth 3 (three) times daily. 03/28/20  Yes Zannie Cove, MD    Physical Exam: Vitals:   04/05/20 0215 04/05/20 0230 04/05/20 0245 04/05/20 0300  BP: 123/66 (!) 107/92 119/64 122/65  Pulse: (!) 58 (!) 57 (!) 57 (!) 59  Resp: (!) 26 (!) 21 (!) 23 19  Temp:      TempSrc:      SpO2: 98% 97% 97% 97%  Weight:       Height:        Constitutional: Awake alert and oriented x3, no associated distress.   Skin: Notable hyperemia on the anterior surfaces of the bilateral lower extremities with some mild blistering noted over the anterior surface of the right lower extremity.  Significant excoriations noted in the groin and over the lateral surfaces of the scrotum.  Good skin turgor noted.   Eyes: Pupils are equally reactive to light.  No evidence of scleral icterus or conjunctival pallor.  ENMT: Moist mucous membranes noted.  Posterior pharynx clear of any exudate or lesions.   Neck: normal, supple, no masses, no thyromegaly.  No evidence of jugular venous distension.   Respiratory: Bibasilar rales noted without any evidence of wheezing.  Normal respiratory effort. No accessory muscle use.  Cardiovascular: Regular rate and rhythm, no murmurs / rubs / gallops.  Extensive bilateral lower extremity pitting edema that tracks up through the thighs 2+ pedal pulses. No carotid bruits.  Chest:   Nontender without crepitus or deformity.   Back:  Nontender without crepitus or deformity. Abdomen: Markedly protuberant abdomen that is soft and nontender.  No evidence of intra-abdominal masses.  Positive bowel sounds noted in all quadrants.   Musculoskeletal: Extensive bilateral lower extremity pitting edema is noted above.  No joint deformity upper and lower extremities. Good ROM, no contractures. Normal muscle tone.  Neurologic: CN 2-12 grossly intact. Sensation intact.  Patient moving all 4 extremities spontaneously.  Patient is following all commands.  Patient is responsive to verbal stimuli.   Psychiatric: Patient exhibits normal mood with appropriate affect.  Patient seems to possess insight as to their current situation.     Labs on Admission: I have personally reviewed following labs and imaging studies -   CBC: Recent Labs  Lab 04/05/20 0013  WBC 9.3  HGB 11.7*  HCT 35.2*  MCV 96.7  PLT 244   Basic  Metabolic Panel: Recent Labs  Lab 04/05/20 0013  NA 134*  K 3.6  CL 100  CO2 27  GLUCOSE 153*  BUN 6  CREATININE 0.56*  CALCIUM 8.2*   GFR: Estimated Creatinine Clearance: 120.5 mL/min (A) (by C-G formula based on SCr of 0.56 mg/dL (L)). Liver Function Tests: No results for input(s): AST, ALT, ALKPHOS, BILITOT, PROT, ALBUMIN in the last 168 hours. No results for input(s): LIPASE, AMYLASE in the last 168 hours. No results for input(s): AMMONIA in the last 168 hours. Coagulation Profile: Recent Labs  Lab 04/05/20 0013  INR 1.6*   Cardiac Enzymes: No results for input(s): CKTOTAL, CKMB, CKMBINDEX, TROPONINI in the last 168 hours. BNP (last 3 results) No results for input(s): PROBNP in the last 8760 hours. HbA1C: No results for input(s): HGBA1C in the last 72 hours. CBG: No results for input(s): GLUCAP in the last 168 hours. Lipid Profile: No results for input(s): CHOL, HDL, LDLCALC, TRIG, CHOLHDL, LDLDIRECT in the last 72 hours. Thyroid Function Tests: Recent Labs    04/03/20 1155  TSH <0.01 Repeated and verified X2.*  FREET4 2.55*   Anemia Panel: No results for input(s): VITAMINB12, FOLATE, FERRITIN, TIBC, IRON, RETICCTPCT in the last 72 hours. Urine analysis: No results found for: COLORURINE, APPEARANCEUR, LABSPEC, PHURINE, GLUCOSEU, HGBUR, BILIRUBINUR, KETONESUR, PROTEINUR, UROBILINOGEN, NITRITE, LEUKOCYTESUR  Radiological Exams on Admission - Personally Reviewed: DG Chest 2 View  Result Date: 04/05/2020 CLINICAL DATA:  Shortness of breath, leg swelling EXAM: CHEST - 2 VIEW COMPARISON:  03/25/2020 FINDINGS: Low volumes and atelectasis. Some additional hazy interstitial opacity and pulmonary vascular cephalization could reflect early interstitial edema with a layering right pleural effusion. No visible left effusion. No pneumothorax. Cardiac silhouette is slightly enlarged from comparison PA radiograph. Remaining cardiomediastinal contours are unremarkable.  Degenerative changes are present in the imaged spine and shoulders. IMPRESSION: Low volumes and atelectasis. Some additional hazy interstitial opacity and pulmonary vascular cephalization could reflect early interstitial edema with a layering right pleural effusion. Slightly increased prominence of the cardiac silhouette. Possible cardiomegaly versus pericardial effusion. Electronically Signed   By: Kreg Shropshire M.D.   On: 04/05/2020 00:31    EKG: Personally reviewed.  Rhythm is sinus bradycardia with heart rate of 59 bpm.  No dynamic ST segment changes appreciated.  Assessment/Plan Principal Problem:   Acute congestive heart failure (HCC)   With extensive peripheral edema that tracks from the distal lower extremities up through the abdominal wall as well as significant scrotal edema with concurrent markedly elevated BNP of 1821  While this presentation is seemingly consistent with acute congestive heart failure, echocardiogram performed on 3/15 related preserved  ejection fraction of 50 to 55% with no evidence of diastolic dysfunction and no obvious evidence of right ventricular dysfunction.  There was concern that the patient may be suffering from high-output failure during last hospitalization however with patient now cardioverted to normal sinus rhythm controlled rate this possibility is seeming to be less likely.  Clinically, the patient appears to be mostly in right heart failure despite the normal echocardiogram findings.   Additionally, patient was only on Lasix 20 mg by mouth daily which possibly was insufficient to maintain euvolemia in the outpatient setting.  Placing patient on Lasix 40 mg IV twice daily  Strict input and output monitoring  Monitoring renal function and electrolytes with serial chemistries  Daily weights  Case should be discussed once again with cardiology to see if patient would benefit from a right heart catheterization.  Active Problems:    Essential  hypertension   Continue propranolol and diltiazem    Type 2 diabetes mellitus without complication, without long-term current use of insulin (HCC)  . Patient been placed on Accu-Cheks before every meal and nightly with sliding scale insulin . Hemoglobin A1C ordered . Diabetic Diet  Atrial Flutter   Patient is currently in sinus rhythm/sinus bradycardia  Rate is currently controlled  That being said, with patient being in sinus bradycardia patient may benefit from some adjustment of his AV nodal blocking agents which can be discussed with cardiology during this hospitalization.  Continuing to monitor on telemetry  Continuing apixaban    Hyperthyroidism   Improving with ongoing methimazole therapy with propranolol  Continue outpatient follow-up with endocrinology  Code Status:  Full code Family Communication: deferred   Status is: Observation  The patient remains OBS appropriate and will d/c before 2 midnights.  Dispo: The patient is from: Home              Anticipated d/c is to: Home              Patient currently is not medically stable to d/c.   Difficult to place patient No        Marinda ElkGeorge J Shalhoub MD [facility] Pager [phone]  If 7PM-7AM, please contact night-coverage [email or web address] Use universal  password for that web site. If you do not have the password, please call the hospital operator.  04/05/2020, 6:51 AM

## 2020-04-05 NOTE — Consult Note (Signed)
CARDIOLOGY CONSULT NOTE  Patient ID: Kevin Branch MRN: [id number] DOB/AGE: 01/30/1970 50 y.o.  Admit date: 04/04/2020 Referring Physician: [facility] Reason for Consultation:  Shortness of breath  HPI:    50y.o.Caucasianmalewith hypertension,h/oatrial flutter-treated with ablation by Dr Johney FrameAllred (02/2019), recent paroxysmal Afib, hypertension, prediabetes, new diagnosis thyrotoxicosis (03/2020), now admitted with acute on chronic HFpEF  Patient was discharged on 03/28/2020 after heart failure, Afib admission, that also led to the diagnosis of hyperthyroidism. Patient has since seen endocrinology and has ben started on methimazole with decrease in free T4. He was lenient with fluid intake- drank roughly 100 onces a day, but denies liberal salt intake. No family h/o heart failure at young age.   He had COVID 60 days ago, but curious why last test was negative and positive now.   Past Medical History:  Diagnosis Date  . A-fib (HCC)   . Essential hypertension   . Hyperglycemia   . Hyperlipidemia   . Overweight   . Typical atrial flutter [facility](HCC)      Past Surgical History:  Procedure Laterality Date  . A-FLUTTER ABLATION N/A 03/03/2019   Procedure: A-FLUTTER ABLATION;  Surgeon: Hillis RangeAllred, James, MD;  Location: [facility] INVASIVE CV LAB;  Service: Cardiovascular;  Laterality: N/A;  . none        Family History  Problem Relation Age of Onset  . Hyperlipidemia Mother   . Heart disease Mother   . Diabetes Mother   . Hyperlipidemia Father   . Hypertension Sister   . Hyperlipidemia Sister      Social History: Social History   Socioeconomic History  . Marital status: Married    Spouse name: Not on file  . Number of children: 6  . Years of education: Not on file  . Highest education level: Not on file  Occupational History  . Not on file  Tobacco Use  . Smoking status: Current Some Day Smoker    Types: Cigars  . Smokeless tobacco: Former NeurosurgeonUser    Types: Snuff  . Tobacco  comment: once a year  Vaping Use  . Vaping Use: Never used  Substance and Sexual Activity  . Alcohol use: Not Currently  . Drug use: Never  . Sexual activity: Not on file  Other Topics Concern  . Not on file  Social History Narrative   Married.   6 children.   Works for [employer].   Lives in [place].   Enjoys hunting and fishing.    Social Determinants of Health   Financial Resource Strain: Not on file  Food Insecurity: Not on file  Transportation Needs: Not on file  Physical Activity: Not on file  Stress: Not on file  Social Connections: Not on file  Intimate Partner Violence: Not on file     Medications Prior to Admission  Medication Sig Dispense Refill Last Dose  . acetaminophen (TYLENOL) 325 MG tablet Take 650 mg by mouth every 6 (six) hours as needed for mild pain.   Past Month at Unknown time  . apixaban (ELIQUIS) 5 MG TABS tablet Take 1 tablet (5 mg total) by mouth 2 (two) times daily. 60 tablet 1 04/04/2020 at 1930  . calcium carbonate (TUMS - DOSED IN MG ELEMENTAL CALCIUM) 500 MG chewable tablet Chew 2 tablets by mouth daily as needed for indigestion or heartburn.   04/04/2020 at Unknown time  . diltiazem (CARDIZEM CD) 180 MG 24 hr capsule Take 1 capsule (180 mg total) by mouth daily. 60 capsule 6 04/04/2020  at Unknown time  . diltiazem (CARDIZEM) 30 MG tablet Take 1 tablet (30 mg total) by mouth 3 (three) times daily as needed (for palpitations with heart rate > 110 despite taking the extended release medicine). 30 tablet 3 Past Month at Unknown time  . flecainide (TAMBOCOR) 50 MG tablet Take 1 tablet (50 mg total) by mouth 2 (two) times daily. 60 tablet 3 04/04/2020 at Unknown time  . furosemide (LASIX) 20 MG tablet Take 1 tablet (20 mg total) by mouth daily. May take extra tablet daily for weight gain/swelling 40 tablet 0 04/04/2020 at Unknown time  . Multiple Vitamins-Minerals (ONE-A-DAY VITACRAVES ADULT PO) Take 1 tablet by mouth daily.    04/04/2020 at Unknown time  . potassium chloride (KLOR-CON) 10 MEQ tablet Take 1 tablet (10 mEq total) by mouth daily. 30 tablet 3 04/04/2020 at Unknown time  . propranolol (INDERAL) 20 MG tablet Take 1 tablet (20 mg total) by mouth 3 (three) times daily. 60 tablet 0 04/04/2020 at 1930  . methimazole (TAPAZOLE) 10 MG tablet Take 1 tablet (10 mg total) by mouth 2 (two) times daily. 180 tablet 1     Review of Systems  Constitutional: Negative for decreased appetite, malaise/fatigue, weight gain and weight loss.  HENT: Negative for congestion.   Eyes: Negative for visual disturbance.  Cardiovascular: Positive for dyspnea on exertion and leg swelling. Negative for chest pain, palpitations and syncope.  Respiratory: Positive for shortness of breath. Negative for cough.   Endocrine: Negative for cold intolerance.  Hematologic/Lymphatic: Does not bruise/bleed easily.  Skin: Negative for itching and rash.  Musculoskeletal: Negative for myalgias.  Gastrointestinal: Negative for abdominal pain, nausea and vomiting.  Genitourinary: Negative for dysuria.  Neurological: Negative for dizziness and weakness.  Psychiatric/Behavioral: The patient is not nervous/anxious.   All other systems reviewed and are negative.     Physical Exam: Physical Exam Vitals and nursing note reviewed.  Constitutional:      General: He is not in acute distress.    Appearance: He is well-developed.  HENT:     Head: Normocephalic and atraumatic.  Eyes:     Conjunctiva/sclera: Conjunctivae normal.     Pupils: Pupils are equal, round, and reactive to light.  Neck:     Vascular: JVD present.  Cardiovascular:     Rate and Rhythm: Normal rate and regular rhythm.     Pulses: Normal pulses and intact distal pulses.     Heart sounds: No murmur heard.   Pulmonary:     Effort: Pulmonary effort is normal.     Breath sounds: Normal breath sounds.  Abdominal:     General: Bowel sounds are normal.     Palpations: Abdomen is  soft.     Tenderness: There is no rebound.  Genitourinary:    Comments: Scrotal edema Musculoskeletal:        General: No tenderness. Normal range of motion.     Right lower leg: Edema (1+) present.     Left lower leg: Edema (1+) present.  Lymphadenopathy:     Cervical: No cervical adenopathy.  Skin:    General: Skin is warm and dry.  Neurological:     Mental Status: He is alert and oriented to person, place, and time.     Cranial Nerves: No cranial nerve deficit.      Labs:   Lab Results  Component Value Date   WBC 9.3 04/05/2020   HGB 11.7 (L) 04/05/2020   HCT 35.2 (L) 04/05/2020   MCV 96.7  04/05/2020   PLT 244 04/05/2020    Recent Labs  Lab 04/05/20 0013 04/05/20 0943  NA 134*  --   K 3.6  --   CL 100  --   CO2 27  --   BUN 6  --   CREATININE 0.56*  --   CALCIUM 8.2*  --   PROT  --  6.6  BILITOT  --  1.7*  ALKPHOS  --  99  ALT  --  25  AST  --  44*  GLUCOSE 153*  --     Lipid Panel     Component Value Date/Time   CHOL 151 07/03/2016 0933   TRIG 70.0 07/03/2016 0933   HDL 45.20 07/03/2016 0933   CHOLHDL 3 07/03/2016 0933   VLDL 14.0 07/03/2016 0933   LDLCALC 92 07/03/2016 0933    BNP (last 3 results) Recent Labs    03/26/20 0741 04/05/20 0129  BNP 886.5* 1,821.0*    HEMOGLOBIN A1C Lab Results  Component Value Date   HGBA1C 5.5 03/26/2020   MPG 111.15 03/26/2020    TSH Recent Labs    03/26/20 0740 04/03/20 1155  TSH 0.000* <0.01 Repeated and verified X2.*      Radiology: DG Chest 2 View  Result Date: 04/05/2020 CLINICAL DATA:  Shortness of breath, leg swelling EXAM: CHEST - 2 VIEW COMPARISON:  03/25/2020 FINDINGS: Low volumes and atelectasis. Some additional hazy interstitial opacity and pulmonary vascular cephalization could reflect early interstitial edema with a layering right pleural effusion. No visible left effusion. No pneumothorax. Cardiac silhouette is slightly enlarged from comparison PA radiograph. Remaining  cardiomediastinal contours are unremarkable. Degenerative changes are present in the imaged spine and shoulders. IMPRESSION: Low volumes and atelectasis. Some additional hazy interstitial opacity and pulmonary vascular cephalization could reflect early interstitial edema with a layering right pleural effusion. Slightly increased prominence of the cardiac silhouette. Possible cardiomegaly versus pericardial effusion. Electronically Signed   By: Kreg Shropshire M.D.   On: 04/05/2020 00:31   US Abdomen Limited RUQ (LIVER/GB)  Result Date: 04/05/2020 CLINICAL DATA:  Abnormal liver enzymes EXAM: ULTRASOUND ABDOMEN LIMITED RIGHT UPPER QUADRANT COMPARISON:  None available FINDINGS: Gallbladder: No gallstones or wall thickening visualized. No sonographic Murphy sign noted by sonographer. Common bile duct: Diameter: 3 mm Liver: No focal lesion identified. Within normal limits in parenchymal echogenicity. Portal vein is patent on color Doppler imaging with normal direction of blood flow towards the liver. Other: Small right pleural effusion and rounded hepatic cava. IMPRESSION: 1. Negative liver and gallbladder. 2. Small right pleural effusion and rounded hepatic cava, question right heart dysfunction. Electronically Signed   By: Marnee Spring M.D.   On: 04/05/2020 07:16    Scheduled Meds: . apixaban  5 mg Oral BID  . vitamin C  500 mg Oral Daily  . diltiazem  180 mg Oral Daily  . flecainide  50 mg Oral BID  . furosemide  40 mg Intravenous BID  . insulin aspart  0-15 Units Subcutaneous TID AC & HS  . methimazole  10 mg Oral BID  . propranolol  20 mg Oral TID  . sodium chloride flush  3 mL Intravenous Q12H  . zinc sulfate  220 mg Oral Daily   Continuous Infusions: . sodium chloride    . [START ON 04/06/2020] remdesivir 100 mg in NS 100 mL     PRN Meds:.sodium chloride, acetaminophen, albuterol, guaiFENesin-dextromethorphan, ondansetron (ZOFRAN) IV, sodium chloride flush  CARDIAC STUDIES:  EKG  04/05/2020: Sinus rhythm 59  bpm Nonspecific T wave abnormality  EKG3/14/2022: Coarse Afib w/RVR Nonspecific ST-T changes  Echocardiogram3/15/2022: 1. Left ventricular ejection fraction, by estimation, is 50 to 55%. The  left ventricle has low normal function. The left ventricle has no regional  wall motion abnormalities. Left ventricular diastolic function could not  be evaluated.  2. Right ventricular systolic function is normal. The right ventricular  size is severely enlarged. There is moderately elevated pulmonary artery  systolic pressure.  3. Left atrial size was moderately dilated.  4. Right atrial size was moderately dilated.  5. The mitral valve is normal in structure. Mild to moderate mitral valve  regurgitation.  6. Tricuspid valve regurgitation is mild to moderate.  7. The aortic valve is tricuspid. Aortic valve regurgitation is mild. No  aortic stenosis is present.   Assessment & Recommendations:  50y.o.Caucasianmalewith hypertension,h/oatrial flutter-treated with ablation by Dr Johney Frame (02/2019), recent paroxysmal Afib, hypertension, prediabetes, new diagnosis thyrotoxicosis (03/2020), now admitted with acute on chronic HFpEF  HFpEF: Suspect high output failure driven by hyperthyroidism Recommend lasix 40 mg IV bid Added Entresto. Will restrict fluid intake to 1800 cc  Paroxysmal Afib: Currently in sinus rhythm Converted to sinus rhythm on 03/26/20 evening Continue flecainide 50 mg bid, diltiazem PO 180 mg, propranolol 20 mg tid      Elder Negus, MD Pager: [phone] Office: [phone]

## 2020-04-05 NOTE — ED Notes (Signed)
Lab called, pt is Covid positive.

## 2020-04-06 DIAGNOSIS — Z8249 Family history of ischemic heart disease and other diseases of the circulatory system: Secondary | ICD-10-CM | POA: Diagnosis not present

## 2020-04-06 DIAGNOSIS — E119 Type 2 diabetes mellitus without complications: Secondary | ICD-10-CM | POA: Diagnosis present

## 2020-04-06 DIAGNOSIS — I509 Heart failure, unspecified: Secondary | ICD-10-CM | POA: Diagnosis not present

## 2020-04-06 DIAGNOSIS — E785 Hyperlipidemia, unspecified: Secondary | ICD-10-CM | POA: Diagnosis present

## 2020-04-06 DIAGNOSIS — Z833 Family history of diabetes mellitus: Secondary | ICD-10-CM | POA: Diagnosis not present

## 2020-04-06 DIAGNOSIS — Z79899 Other long term (current) drug therapy: Secondary | ICD-10-CM | POA: Diagnosis not present

## 2020-04-06 DIAGNOSIS — U071 COVID-19: Secondary | ICD-10-CM | POA: Diagnosis present

## 2020-04-06 DIAGNOSIS — Z83438 Family history of other disorder of lipoprotein metabolism and other lipidemia: Secondary | ICD-10-CM | POA: Diagnosis not present

## 2020-04-06 DIAGNOSIS — I480 Paroxysmal atrial fibrillation: Secondary | ICD-10-CM | POA: Diagnosis present

## 2020-04-06 DIAGNOSIS — I5031 Acute diastolic (congestive) heart failure: Secondary | ICD-10-CM

## 2020-04-06 DIAGNOSIS — I5033 Acute on chronic diastolic (congestive) heart failure: Secondary | ICD-10-CM | POA: Diagnosis present

## 2020-04-06 DIAGNOSIS — E0590 Thyrotoxicosis, unspecified without thyrotoxic crisis or storm: Secondary | ICD-10-CM | POA: Diagnosis present

## 2020-04-06 DIAGNOSIS — F17290 Nicotine dependence, other tobacco product, uncomplicated: Secondary | ICD-10-CM | POA: Diagnosis present

## 2020-04-06 DIAGNOSIS — I110 Hypertensive heart disease with heart failure: Secondary | ICD-10-CM | POA: Diagnosis present

## 2020-04-06 DIAGNOSIS — Z7901 Long term (current) use of anticoagulants: Secondary | ICD-10-CM | POA: Diagnosis not present

## 2020-04-06 DIAGNOSIS — I272 Pulmonary hypertension, unspecified: Secondary | ICD-10-CM | POA: Diagnosis present

## 2020-04-06 DIAGNOSIS — I959 Hypotension, unspecified: Secondary | ICD-10-CM | POA: Diagnosis not present

## 2020-04-06 DIAGNOSIS — R0602 Shortness of breath: Secondary | ICD-10-CM | POA: Diagnosis present

## 2020-04-06 LAB — GLUCOSE, CAPILLARY
Glucose-Capillary: 97 mg/dL (ref 70–99)
Glucose-Capillary: 102 mg/dL — ABNORMAL HIGH (ref 70–99)
Glucose-Capillary: 123 mg/dL — ABNORMAL HIGH (ref 70–99)
Glucose-Capillary: 112 mg/dL — ABNORMAL HIGH (ref 70–99)

## 2020-04-06 LAB — CBC WITH DIFFERENTIAL/PLATELET
Abs Immature Granulocytes: 0.02 10*3/uL (ref 0.00–0.07)
Basophils Absolute: 0 10*3/uL (ref 0.0–0.1)
Basophils Relative: 0 %
Eosinophils Absolute: 0.2 10*3/uL (ref 0.0–0.5)
Eosinophils Relative: 3 %
HCT: 34.5 % — ABNORMAL LOW (ref 39.0–52.0)
Hemoglobin: 11.4 g/dL — ABNORMAL LOW (ref 13.0–17.0)
Immature Granulocytes: 0 %
Lymphocytes Relative: 33 %
Lymphs Abs: 3 10*3/uL (ref 0.7–4.0)
MCH: 31.5 pg (ref 26.0–34.0)
MCHC: 33 g/dL (ref 30.0–36.0)
MCV: 95.3 fL (ref 80.0–100.0)
Monocytes Absolute: 1 10*3/uL (ref 0.1–1.0)
Monocytes Relative: 11 %
Neutro Abs: 4.8 10*3/uL (ref 1.7–7.7)
Neutrophils Relative %: 53 %
Platelets: 194 10*3/uL (ref 150–400)
RBC: 3.62 MIL/uL — ABNORMAL LOW (ref 4.22–5.81)
RDW: 14.6 % (ref 11.5–15.5)
WBC: 9.1 10*3/uL (ref 4.0–10.5)
nRBC: 0 % (ref 0.0–0.2)

## 2020-04-06 LAB — COMPREHENSIVE METABOLIC PANEL
ALT: 24 U/L (ref 0–44)
AST: 46 U/L — ABNORMAL HIGH (ref 15–41)
Albumin: 2.3 g/dL — ABNORMAL LOW (ref 3.5–5.0)
Alkaline Phosphatase: 98 U/L (ref 38–126)
Anion gap: 6 (ref 5–15)
BUN: 6 mg/dL (ref 6–20)
CO2: 28 mmol/L (ref 22–32)
Calcium: 8.1 mg/dL — ABNORMAL LOW (ref 8.9–10.3)
Chloride: 101 mmol/L (ref 98–111)
Creatinine, Ser: 0.53 mg/dL — ABNORMAL LOW (ref 0.61–1.24)
GFR, Estimated: >60 mL/min (ref >60)
Glucose, Bld: 80 mg/dL (ref 70–99)
Potassium: 3.5 mmol/L (ref 3.5–5.1)
Sodium: 135 mmol/L (ref 135–145)
Total Bilirubin: 1.4 mg/dL — ABNORMAL HIGH (ref 0.3–1.2)
Total Protein: 6.6 g/dL (ref 6.5–8.1)

## 2020-04-06 LAB — C-REACTIVE PROTEIN: CRP: 1.1 mg/dL — ABNORMAL HIGH (ref <1.0)

## 2020-04-06 LAB — D-DIMER, QUANTITATIVE: D-Dimer, Quant: 3.09 ug/mL-FEU — ABNORMAL HIGH (ref 0.00–0.50)

## 2020-04-06 MED ORDER — TRAMADOL HCL 50 MG PO TABS
100.0000 mg | ORAL_TABLET | Freq: Two times a day (BID) | ORAL | Status: DC | PRN
Start: 2020-04-06 — End: 2020-04-09

## 2020-04-06 MED ORDER — SACUBITRIL-VALSARTAN 24-26 MG PO TABS
1.0000 | ORAL_TABLET | Freq: Two times a day (BID) | ORAL | Status: DC
  Administered 2020-04-07 – 2020-04-09 (×4): 1 via ORAL
  Filled 2020-04-06 (×7): qty 1

## 2020-04-06 NOTE — Progress Notes (Signed)
PROGRESS NOTE    BAYNE FOSNAUGH  HUD:[id number] DOB: January 07, 1971 DOA: 04/04/2020 PCP: Doreene Nest, NP    Chief Complaint  Patient presents with  . Leg Swelling  . Edema  . Shortness of Breath    Brief Narrative:  50 year old male with past medical history of atrial flutter status post ablation2/21, COVID-19 infection(01/2020),diabetes mellitus type 2, hyperlipidemia, hypertension and recent diagnosis of hyperthyroidism currently on methimazole who presents to [facility] emergency department with complaints of bilateral lower extremity edema, increasing abdominal girth and scrotal edema. He was admitted for CHF, started on IV lasix 40 mg BID.   Assessment & Plan:   Principal Problem:   Acute congestive heart failure (HCC) Active Problems:   Essential hypertension   Type 2 diabetes mellitus without complication, without long-term current use of insulin (HCC)   Paroxysmal atrial fibrillation (HCC)   Hyperthyroidism   COVID-19 virus infection   Acute on chronic diastolic heart failure driven by hyperthyroidism, patient reports that he used to drink 100 ounces of water every day.  He was started on IV Lasix 40 mg twice daily and diuresing appropriately Continue with strict intake and output and daily weights Cardiology consulted and recommended to add Entresto.    Paroxysmal atrial fibrillation Patient currently in sinus rhythm Continue with flecainide Cardizem and propranolol Continue with Eliquis for anticoagulation.   Hyperthyroidism Continue with methimazole 10 mg twice daily     Incidental finding of COVID-19 virus infection Patient is currently asymptomatic he is on room air denies any cough or shortness of breath. Patient is on IV remdesivir. Continue to monitor inflammatory markers.     Essential hypertension Blood pressure parameters appear to be optimal at this time.    Type 2 diabetes mellitus Continue sliding scale  insulin.   DVT prophylaxis: ELIQUIS Code Status: FULL CODE.  Family Communication: NONE AT BEDSIDE.  Disposition:   Status is: Observation  The patient will require care spanning > 2 midnights and should be moved to inpatient because: Ongoing diagnostic testing needed not appropriate for outpatient work up and IV treatments appropriate due to intensity of illness or inability to take PO  Dispo: The patient is from: Home              Anticipated d/c is to: Home              Patient currently is not medically stable to d/c.   Difficult to place patient No       Consultants:   Cardiology   Procedures:    Antimicrobials: none.    Subjective: Breathing has improved.   Objective: Vitals:   04/06/20 0359 04/06/20 0729 04/06/20 1137 04/06/20 1619  BP: 118/60 123/60 112/67 (!) 117/53  Pulse: (!) 58 60 60 60  Resp: 19 18 20 18   Temp: 98.7 F (37.1 C) 98.3 F (36.8 C) 98 F (36.7 C) 98.4 F (36.9 C)  TempSrc: Axillary Oral Oral Oral  SpO2: 93% 96% 98% 98%  Weight: 87.5 kg     Height:        Intake/Output Summary (Last 24 hours) at 04/06/2020 1631 Last data filed at 04/05/2020 1915 Gross per 24 hour  Intake 120 ml  Output 1150 ml  Net -1030 ml   Filed Weights   04/05/20 0000 04/06/20 0359  Weight: 92.5 kg 87.5 kg    Examination:  General exam: Appears calm and comfortable  Respiratory system: Clear to auscultation. Respiratory effort normal. Cardiovascular system: S1 & S2 heard,  RRR. No JVD,   Gastrointestinal system: Abdomen is nondistended, soft and nontender.  Normal bowel sounds heard. Central nervous system: Alert and oriented. No focal neurological deficits. Extremities: 2 + pedal edema.  Skin: No rashes, lesions or ulcers Psychiatry:  Mood & affect appropriate.     Data Reviewed: I have personally reviewed following labs and imaging studies  CBC: Recent Labs  Lab 04/05/20 0013 04/06/20 0126  WBC 9.3 9.1  NEUTROABS  --  4.8  HGB 11.7*  11.4*  HCT 35.2* 34.5*  MCV 96.7 95.3  PLT 244 194    Basic Metabolic Panel: Recent Labs  Lab 04/05/20 0013 04/06/20 0126  NA 134* 135  K 3.6 3.5  CL 100 101  CO2 27 28  GLUCOSE 153* 80  BUN 6 6  CREATININE 0.56* 0.53*  CALCIUM 8.2* 8.1*    GFR: Estimated Creatinine Clearance: 117.4 mL/min (A) (by C-G formula based on SCr of 0.53 mg/dL (L)).  Liver Function Tests: Recent Labs  Lab 04/05/20 0943 04/06/20 0126  AST 44* 46*  ALT 25 24  ALKPHOS 99 98  BILITOT 1.7* 1.4*  PROT 6.6 6.6  ALBUMIN 2.4* 2.3*    CBG: Recent Labs  Lab 04/05/20 1155 04/05/20 1614 04/05/20 2016 04/06/20 0728 04/06/20 1141  GLUCAP 156* 110* 152* 97 102*     Recent Results (from the past 240 hour(s))  Resp Panel by RT-PCR (Flu A&B, Covid) Nasopharyngeal Swab     Status: Abnormal   Collection Time: 04/05/20  3:45 AM   Specimen: Nasopharyngeal Swab; Nasopharyngeal(NP) swabs in vial transport medium  Result Value Ref Range Status   SARS Coronavirus 2 by RT PCR POSITIVE (A) NEGATIVE Final    Comment: RESULT CALLED TO, READ BACK BY AND VERIFIED WITH: L DANIELS RN 04/05/20 0447 JDW (NOTE) SARS-CoV-2 target nucleic acids are DETECTED.  The SARS-CoV-2 RNA is generally detectable in upper respiratory specimens during the acute phase of infection. Positive results are indicative of the presence of the identified virus, but do not rule out bacterial infection or co-infection with other pathogens not detected by the test. Clinical correlation with patient history and other diagnostic information is necessary to determine patient infection status. The expected result is Negative.  Fact Sheet for Patients: BloggerCourse.com  Fact Sheet for Healthcare Providers: SeriousBroker.it  This test is not yet approved or cleared by the [place] FDA and  has been authorized for detection and/or diagnosis of SARS-CoV-2 by FDA under an Emergency Use  Authorization (EUA).  This EUA will remain in effect (meaning this test can be use d) for the duration of  the COVID-19 declaration under Section 564(b)(1) of the Act, 21 U.S.C. section 360bbb-3(b)(1), unless the authorization is terminated or revoked sooner.     Influenza A by PCR NEGATIVE NEGATIVE Final   Influenza B by PCR NEGATIVE NEGATIVE Final    Comment: (NOTE) The Xpert Xpress SARS-CoV-2/FLU/RSV plus assay is intended as an aid in the diagnosis of influenza from Nasopharyngeal swab specimens and should not be used as a sole basis for treatment. Nasal washings and aspirates are unacceptable for Xpert Xpress SARS-CoV-2/FLU/RSV testing.  Fact Sheet for Patients: BloggerCourse.com  Fact Sheet for Healthcare Providers: SeriousBroker.it  This test is not yet approved or cleared by the [place] FDA and has been authorized for detection and/or diagnosis of SARS-CoV-2 by FDA under an Emergency Use Authorization (EUA). This EUA will remain in effect (meaning this test can be used) for the duration of the COVID-19 declaration under  Section 564(b)(1) of the Act, 21 U.S.C. section 360bbb-3(b)(1), unless the authorization is terminated or revoked.  Performed at [facility], [address] [place], [place] [address]          Radiology Studies: DG Chest 2 View  Result Date: 04/05/2020 CLINICAL DATA:  Shortness of breath, leg swelling EXAM: CHEST - 2 VIEW COMPARISON:  03/25/2020 FINDINGS: Low volumes and atelectasis. Some additional hazy interstitial opacity and pulmonary vascular cephalization could reflect early interstitial edema with a layering right pleural effusion. No visible left effusion. No pneumothorax. Cardiac silhouette is slightly enlarged from comparison PA radiograph. Remaining cardiomediastinal contours are unremarkable. Degenerative changes are present in the imaged spine and shoulders. IMPRESSION: Low  volumes and atelectasis. Some additional hazy interstitial opacity and pulmonary vascular cephalization could reflect early interstitial edema with a layering right pleural effusion. Slightly increased prominence of the cardiac silhouette. Possible cardiomegaly versus pericardial effusion. Electronically Signed   By: Kreg Shropshire M.D.   On: 04/05/2020 00:31   US Abdomen Limited RUQ (LIVER/GB)  Result Date: 04/05/2020 CLINICAL DATA:  Abnormal liver enzymes EXAM: ULTRASOUND ABDOMEN LIMITED RIGHT UPPER QUADRANT COMPARISON:  None available FINDINGS: Gallbladder: No gallstones or wall thickening visualized. No sonographic Murphy sign noted by sonographer. Common bile duct: Diameter: 3 mm Liver: No focal lesion identified. Within normal limits in parenchymal echogenicity. Portal vein is patent on color Doppler imaging with normal direction of blood flow towards the liver. Other: Small right pleural effusion and rounded hepatic cava. IMPRESSION: 1. Negative liver and gallbladder. 2. Small right pleural effusion and rounded hepatic cava, question right heart dysfunction. Electronically Signed   By: Marnee Spring M.D.   On: 04/05/2020 07:16        Scheduled Meds: . apixaban  5 mg Oral BID  . vitamin C  500 mg Oral Daily  . diltiazem  180 mg Oral Daily  . flecainide  50 mg Oral BID  . furosemide  40 mg Intravenous BID  . insulin aspart  0-15 Units Subcutaneous TID AC & HS  . methimazole  10 mg Oral BID  . propranolol  20 mg Oral TID  . sodium chloride flush  3 mL Intravenous Q12H  . zinc sulfate  220 mg Oral Daily   Continuous Infusions: . sodium chloride    . remdesivir 100 mg in NS 100 mL Stopped (04/06/20 1502)     LOS: 0 days        Kathlen Mody, MD [facility]   To contact the attending provider between 7A-7P or the covering provider during after hours 7P-7A, please log into the web site [email or web address] and access using universal [place] password for that web site. If you  do not have the password, please call the hospital operator.  04/06/2020, 4:31 PM

## 2020-04-06 NOTE — Progress Notes (Addendum)
Subjective:  Breathing better. Still has leg edema  Objective:  Vital Signs in the last 24 hours: Temp:  [98 F (36.7 C)-98.7 F (37.1 C)] 98.4 F (36.9 C) (03/26 1619) Pulse Rate:  [58-61] 60 (03/26 1619) Resp:  [18-20] 18 (03/26 1619) BP: (112-123)/(53-67) 117/53 (03/26 1619) SpO2:  [90 %-98 %] 98 % (03/26 1619) Weight:  [87.5 kg] 87.5 kg (03/26 0359)  Intake/Output from previous day: 03/25 0701 - 03/26 0700 In: 660 [P.O.:660] Out: 2350 [Urine:2350]  Physical Exam Vitals and nursing note reviewed.  Constitutional:      General: He is not in acute distress.    Appearance: He is well-developed.  HENT:     Head: Normocephalic and atraumatic.  Eyes:     Conjunctiva/sclera: Conjunctivae normal.     Pupils: Pupils are equal, round, and reactive to light.  Neck:     Vascular: JVD present.  Cardiovascular:     Rate and Rhythm: Normal rate and regular rhythm.     Pulses: Normal pulses and intact distal pulses.     Heart sounds: No murmur heard.   Pulmonary:     Effort: Pulmonary effort is normal.     Breath sounds: Normal breath sounds. No wheezing or rales.  Abdominal:     General: Bowel sounds are normal.     Palpations: Abdomen is soft.     Tenderness: There is no rebound.  Musculoskeletal:        General: No tenderness. Normal range of motion.     Right lower leg: Edema (1+) present.     Left lower leg: Edema (1+) present.  Lymphadenopathy:     Cervical: No cervical adenopathy.  Skin:    General: Skin is warm and dry.  Neurological:     Mental Status: He is alert and oriented to person, place, and time.     Cranial Nerves: No cranial nerve deficit.      Lab Results: BMP Recent Labs    03/28/20 0213 04/05/20 0013 04/06/20 0126  NA 131* 134* 135  K 4.0 3.6 3.5  CL 97* 100 101  CO2 26 27 28   GLUCOSE 128* 153* 80  BUN 25* 6 6  CREATININE 1.04 0.56* 0.53*  CALCIUM 8.2* 8.2* 8.1*  GFRNONAA >60 >60 >60    CBC Recent Labs  Lab 04/06/20 0126  WBC  9.1  RBC 3.62*  HGB 11.4*  HCT 34.5*  PLT 194  MCV 95.3  MCH 31.5  MCHC 33.0  RDW 14.6  LYMPHSABS 3.0  MONOABS 1.0  EOSABS 0.2  BASOSABS 0.0    HEMOGLOBIN A1C Lab Results  Component Value Date   HGBA1C 5.5 03/26/2020   MPG 111.15 03/26/2020    Cardiac Panel (last 3 results) Results for LAMARKUS, NEBEL "RANDY" (MRN [name]) as of 04/06/2020 18:39  Ref. Range 04/05/2020 01:29 04/05/2020 03:30  Troponin I (High Sensitivity) Latest Ref Range: <18 ng/L 16 14   BNP (last 3 results) Recent Labs    03/26/20 0741 04/05/20 0129  BNP 886.5* 1,821.0*    TSH Recent Labs    03/26/20 0740 04/03/20 1155  TSH 0.000* <0.01 Repeated and verified X2.*    Lipid Panel     Component Value Date/Time   CHOL 151 07/03/2016 0933   TRIG 70.0 07/03/2016 0933   HDL 45.20 07/03/2016 0933   CHOLHDL 3 07/03/2016 0933   VLDL 14.0 07/03/2016 0933   LDLCALC 92 07/03/2016 0933     Hepatic Function Panel Recent Labs  03/27/20 0336 04/05/20 0943 04/06/20 0126  PROT 6.7 6.6 6.6  ALBUMIN 2.3* 2.4* 2.3*  AST 54* 44* 46*  ALT 33 25 24  ALKPHOS 94 99 98  BILITOT 2.6* 1.7* 1.4*  BILIDIR  --  0.9*  --   IBILI  --  0.8  --     Imaging: CXR 03/26/2020: Low volumes and atelectasis. Some additional hazy interstitial opacity and pulmonary vascular cephalization could reflect early interstitial edema with a layering right pleural effusion. No visible left effusion. No pneumothorax. Cardiac silhouette is slightly enlarged from comparison PA radiograph. Remaining cardiomediastinal contours are unremarkable. Degenerative changes are present in the imaged spine and shoulders.  IMPRESSION: Low volumes and atelectasis.  Some additional hazy interstitial opacity and pulmonary vascular cephalization could reflect early interstitial edema with a layering right pleural effusion.  Slightly increased prominence of the cardiac silhouette. Possible cardiomegaly versus pericardial  effusion.   Cardiac Studies:  EKG 04/05/2020: Sinus rhythm 59 bpm Nonspecific T wave abnormality  EKG3/14/2022: Coarse Afibw/RVR Nonspecific ST-T changes  Echocardiogram3/15/2022: 1. Left ventricular ejection fraction, by estimation, is 50 to 55%. The  left ventricle has low normal function. The left ventricle has no regional  wall motion abnormalities. Left ventricular diastolic function could not  be evaluated.  2. Right ventricular systolic function is normal. The right ventricular  size is severely enlarged. There is moderately elevated pulmonary artery  systolic pressure.  3. Left atrial size was moderately dilated.  4. Right atrial size was moderately dilated.  5. The mitral valve is normal in structure. Mild to moderate mitral valve  regurgitation.  6. Tricuspid valve regurgitation is mild to moderate.  7. The aortic valve is tricuspid. Aortic valve regurgitation is mild. No  aortic stenosis is present.   Assessment & Recommendations:  50y.o.Caucasianmalewith hypertension,h/oatrial flutter-treated with ablation by Dr Johney Frame (02/2019), recent paroxysmal Afib, hypertension, prediabetes, new diagnosis thyrotoxicosis (03/2020), now admitted with acute on chronic HFpEF  HFpEF: Suspect high output failure driven by hyperthyroidism Continue lasix 40 mg IV bid. Could switch to PO on 3/27.  Start Entresto to 24-26 mg bid. (Realized that the order was not placed before) Will restrict fluid intake to 1800 cc Will obtain limited echocardiogram to assess systolic and diastolic funciton.   Elevated liver enzymes: Combination of congestive hepatopathy and thyrotoxicosis. Expect this to improve.   Paroxysmal Afib: Currently in sinus rhythm Converted to sinus rhythm on 03/26/20 evening Continueflecainide 50 mg bid, diltiazem PO 180 mg, propranolol 20 mg tid     Elder Negus, MD Pager: [phone] Office: [phone]

## 2020-04-07 ENCOUNTER — Inpatient Hospital Stay (HOSPITAL_COMMUNITY): Payer: 59

## 2020-04-07 DIAGNOSIS — I509 Heart failure, unspecified: Secondary | ICD-10-CM | POA: Diagnosis not present

## 2020-04-07 DIAGNOSIS — I5031 Acute diastolic (congestive) heart failure: Secondary | ICD-10-CM | POA: Diagnosis not present

## 2020-04-07 DIAGNOSIS — I272 Pulmonary hypertension, unspecified: Secondary | ICD-10-CM

## 2020-04-07 DIAGNOSIS — U071 COVID-19: Secondary | ICD-10-CM | POA: Diagnosis not present

## 2020-04-07 LAB — ECHOCARDIOGRAM LIMITED
Area-P 1/2: 1.78 cm2
Height: 68 in
P 1/2 time: 513 msec
Radius: 0.4 cm
S' Lateral: 3.1 cm
Weight: 3088 oz

## 2020-04-07 LAB — COMPREHENSIVE METABOLIC PANEL
ALT: 20 U/L (ref 0–44)
AST: 46 U/L — ABNORMAL HIGH (ref 15–41)
Albumin: 2.2 g/dL — ABNORMAL LOW (ref 3.5–5.0)
Alkaline Phosphatase: 87 U/L (ref 38–126)
Anion gap: 7 (ref 5–15)
BUN: 10 mg/dL (ref 6–20)
CO2: 27 mmol/L (ref 22–32)
Calcium: 8 mg/dL — ABNORMAL LOW (ref 8.9–10.3)
Chloride: 100 mmol/L (ref 98–111)
Creatinine, Ser: 0.56 mg/dL — ABNORMAL LOW (ref 0.61–1.24)
GFR, Estimated: >60 mL/min (ref >60)
Glucose, Bld: 84 mg/dL (ref 70–99)
Potassium: 4 mmol/L (ref 3.5–5.1)
Sodium: 134 mmol/L — ABNORMAL LOW (ref 135–145)
Total Bilirubin: 1.6 mg/dL — ABNORMAL HIGH (ref 0.3–1.2)
Total Protein: 6 g/dL — ABNORMAL LOW (ref 6.5–8.1)

## 2020-04-07 LAB — CBC WITH DIFFERENTIAL/PLATELET
Abs Immature Granulocytes: 0.03 10*3/uL (ref 0.00–0.07)
Basophils Absolute: 0 10*3/uL (ref 0.0–0.1)
Basophils Relative: 0 %
Eosinophils Absolute: 0.5 10*3/uL (ref 0.0–0.5)
Eosinophils Relative: 5 %
HCT: 33.9 % — ABNORMAL LOW (ref 39.0–52.0)
Hemoglobin: 11.8 g/dL — ABNORMAL LOW (ref 13.0–17.0)
Immature Granulocytes: 0 %
Lymphocytes Relative: 34 %
Lymphs Abs: 3.3 10*3/uL (ref 0.7–4.0)
MCH: 33.1 pg (ref 26.0–34.0)
MCHC: 34.8 g/dL (ref 30.0–36.0)
MCV: 95 fL (ref 80.0–100.0)
Monocytes Absolute: 1.2 10*3/uL — ABNORMAL HIGH (ref 0.1–1.0)
Monocytes Relative: 12 %
Neutro Abs: 4.8 10*3/uL (ref 1.7–7.7)
Neutrophils Relative %: 49 %
Platelets: 203 10*3/uL (ref 150–400)
RBC: 3.57 MIL/uL — ABNORMAL LOW (ref 4.22–5.81)
RDW: 14.7 % (ref 11.5–15.5)
WBC: 9.8 10*3/uL (ref 4.0–10.5)
nRBC: 0 % (ref 0.0–0.2)

## 2020-04-07 LAB — GLUCOSE, CAPILLARY
Glucose-Capillary: 78 mg/dL (ref 70–99)
Glucose-Capillary: 120 mg/dL — ABNORMAL HIGH (ref 70–99)
Glucose-Capillary: 139 mg/dL — ABNORMAL HIGH (ref 70–99)
Glucose-Capillary: 141 mg/dL — ABNORMAL HIGH (ref 70–99)

## 2020-04-07 LAB — C-REACTIVE PROTEIN: CRP: 1.1 mg/dL — ABNORMAL HIGH (ref <1.0)

## 2020-04-07 LAB — TRAB (TSH RECEPTOR BINDING ANTIBODY): TRAB: 34.33 IU/L — ABNORMAL HIGH (ref <=2.00)

## 2020-04-07 LAB — D-DIMER, QUANTITATIVE: D-Dimer, Quant: 2.59 ug/mL-FEU — ABNORMAL HIGH (ref 0.00–0.50)

## 2020-04-07 MED ORDER — FUROSEMIDE 10 MG/ML IJ SOLN
80.0000 mg | Freq: Two times a day (BID) | INTRAMUSCULAR | Status: DC
  Administered 2020-04-08: 80 mg via INTRAVENOUS
  Filled 2020-04-07: qty 8

## 2020-04-07 MED ORDER — PERFLUTREN LIPID MICROSPHERE
1.0000 mL | INTRAVENOUS | Status: AC | PRN
  Administered 2020-04-07: 2 mL via INTRAVENOUS
  Filled 2020-04-07: qty 10

## 2020-04-07 NOTE — TOC Initial Note (Signed)
Transition of Care [facility]) - Initial/Assessment Note    Patient Details  Name: Kevin Branch MRN: [id number] Date of Birth: July 27, 1970  Transition of Care [facility] CM/SW Contact:    Lawerance Sabal, RN Phone Number: 04/07/2020, 1:32 PM  Clinical Narrative:            Patient readmitted for CHF, edema, +COVID.  Recent ablation 2/21. Suspect high output failure driven by hyperthyroidism Patient independent,  from home w spouse. Was weighing occasionally and noticed weight gain and returned to ED. Treating w diuretics and Entresto. Will send benefit check for Entresto that will result Monday.         Expected Discharge Plan: Home/Self Care Barriers to Discharge: Continued Medical Work up   Patient Goals and CMS Choice        Expected Discharge Plan and Services Expected Discharge Plan: Home/Self Care   Discharge Planning Services: CM Consult   Living arrangements for the past 2 months: Single Family Home                                      Prior Living Arrangements/Services Living arrangements for the past 2 months: Single Family Home Lives with:: Spouse                   Activities of Daily Living Home Assistive Devices/Equipment: None ADL Screening (condition at time of admission) Patient's cognitive ability adequate to safely complete daily activities?: Yes Is the patient deaf or have difficulty hearing?: No Does the patient have difficulty seeing, even when wearing glasses/contacts?: No Does the patient have difficulty concentrating, remembering, or making decisions?: No Patient able to express need for assistance with ADLs?: Yes Does the patient have difficulty dressing or bathing?: No Independently performs ADLs?: Yes (appropriate for developmental age) Does the patient have difficulty walking or climbing stairs?: No Weakness of Legs: None Weakness of Arms/Hands: None  Permission Sought/Granted                  Emotional Assessment               Admission diagnosis:  Acute congestive heart failure (HCC) [I50.9] Anasarca [R60.1] Abnormal liver enzymes [R74.8] Acute on chronic congestive heart failure, unspecified heart failure type (HCC) [I50.9] Acute CHF (congestive heart failure) (HCC) [I50.9] Patient Active Problem List   Diagnosis Date Noted  . Acute congestive heart failure (HCC) 04/05/2020  . Hyperthyroidism 04/05/2020  . COVID-19 virus infection 04/05/2020  . Acute CHF (congestive heart failure) (HCC) 03/26/2020  . Paroxysmal atrial fibrillation (HCC) 03/14/2020  . Secondary hypercoagulable state (HCC) 03/14/2020  . Screening cholesterol level 02/22/2019  . Typical atrial flutter (HCC) 02/22/2019  . Hyperlipidemia 07/03/2016  . Essential hypertension 04/08/2016  . Type 2 diabetes mellitus without complication, without long-term current use of insulin (HCC) 04/08/2016   PCP:  Doreene Nest, NP Pharmacy:   [facility] [address] [place] - [address] [place] [address] Phone: [phone] Fax: [phone]  [facility] [place], [place] - [address] [place] [address] Phone: [phone] Fax: [phone]     Social Determinants of Health (SDOH) Interventions    Readmission Risk Interventions No flowsheet data found.

## 2020-04-07 NOTE — Progress Notes (Signed)
Subjective:  Breathing better. Still has leg edema  Objective:  Vital Signs in the last 24 hours: Temp:  [98 F (36.7 C)-98.8 F (37.1 C)] 98.8 F (37.1 C) (03/27 1410) Pulse Rate:  [53-62] 62 (03/27 2133) Resp:  [16-23] 20 (03/27 1410) BP: (102-116)/(52-65) 103/65 (03/27 1410) SpO2:  [95 %-100 %] 95 % (03/27 1410) Weight:  [87.5 kg] 87.5 kg (03/27 0331)  Intake/Output from previous day: No intake/output data recorded.  Physical Exam Vitals and nursing note reviewed.  Constitutional:      General: He is not in acute distress.    Appearance: He is well-developed.  HENT:     Head: Normocephalic and atraumatic.  Eyes:     Conjunctiva/sclera: Conjunctivae normal.     Pupils: Pupils are equal, round, and reactive to light.  Neck:     Vascular: JVD present.  Cardiovascular:     Rate and Rhythm: Normal rate and regular rhythm.     Pulses: Normal pulses and intact distal pulses.     Heart sounds: No murmur heard.   Pulmonary:     Effort: Pulmonary effort is normal.     Breath sounds: Normal breath sounds. No wheezing or rales.  Abdominal:     General: Bowel sounds are normal.     Palpations: Abdomen is soft.     Tenderness: There is no rebound.  Musculoskeletal:        General: No tenderness. Normal range of motion.     Right lower leg: Edema (1+) present.     Left lower leg: Edema (1+) present.  Lymphadenopathy:     Cervical: No cervical adenopathy.  Skin:    General: Skin is warm and dry.  Neurological:     Mental Status: He is alert and oriented to person, place, and time.     Cranial Nerves: No cranial nerve deficit.      Lab Results: BMP Recent Labs    04/05/20 0013 04/06/20 0126 04/07/20 0236  NA 134* 135 134*  K 3.6 3.5 4.0  CL 100 101 100  CO2 27 28 27   GLUCOSE 153* 80 84  BUN 6 6 10   CREATININE 0.56* 0.53* 0.56*  CALCIUM 8.2* 8.1* 8.0*  GFRNONAA >60 >60 >60    CBC Recent Labs  Lab 04/07/20 0236  WBC 9.8  RBC 3.57*  HGB 11.8*  HCT  33.9*  PLT 203  MCV 95.0  MCH 33.1  MCHC 34.8  RDW 14.7  LYMPHSABS 3.3  MONOABS 1.2*  EOSABS 0.5  BASOSABS 0.0    HEMOGLOBIN A1C Lab Results  Component Value Date   HGBA1C 5.5 03/26/2020   MPG 111.15 03/26/2020    Cardiac Panel (last 3 results) Results for [name], TINNEL "Kevin Branch" (MRN 03/28/2020) as of 04/06/2020 18:39  Ref. Range 04/05/2020 01:29 04/05/2020 03:30  Troponin I (High Sensitivity) Latest Ref Range: <18 ng/L 16 14   BNP (last 3 results) Recent Labs    03/26/20 0741 04/05/20 0129  BNP 886.5* 1,821.0*    TSH Recent Labs    03/26/20 0740 04/03/20 1155  TSH 0.000* <0.01 Repeated and verified X2.*    Lipid Panel     Component Value Date/Time   CHOL 151 07/03/2016 0933   TRIG 70.0 07/03/2016 0933   HDL 45.20 07/03/2016 0933   CHOLHDL 3 07/03/2016 0933   VLDL 14.0 07/03/2016 0933   LDLCALC 92 07/03/2016 0933     Hepatic Function Panel Recent Labs    04/05/20 0943 04/06/20 0126 04/07/20 0236  PROT 6.6 6.6 6.0*  ALBUMIN 2.4* 2.3* 2.2*  AST 44* 46* 46*  ALT 25 24 20   ALKPHOS 99 98 87  BILITOT 1.7* 1.4* 1.6*  BILIDIR 0.9*  --   --   IBILI 0.8  --   --     Imaging: CXR 03/26/2020: Low volumes and atelectasis. Some additional hazy interstitial opacity and pulmonary vascular cephalization could reflect early interstitial edema with a layering right pleural effusion. No visible left effusion. No pneumothorax. Cardiac silhouette is slightly enlarged from comparison PA radiograph. Remaining cardiomediastinal contours are unremarkable. Degenerative changes are present in the imaged spine and shoulders.  IMPRESSION: Low volumes and atelectasis.  Some additional hazy interstitial opacity and pulmonary vascular cephalization could reflect early interstitial edema with a layering right pleural effusion.  Slightly increased prominence of the cardiac silhouette. Possible cardiomegaly versus pericardial effusion.   Cardiac Studies:  EKG  04/05/2020: Sinus rhythm 59 bpm Nonspecific T wave abnormality  EKG3/14/2022: Coarse Afibw/RVR Nonspecific ST-T changes  Echocardiogram3/15/2022: 1. Left ventricular ejection fraction, by estimation, is 50 to 55%. The  left ventricle has low normal function. The left ventricle has no regional  wall motion abnormalities. Left ventricular diastolic function could not  be evaluated.  2. Right ventricular systolic function is normal. The right ventricular  size is severely enlarged. There is moderately elevated pulmonary artery  systolic pressure.  3. Left atrial size was moderately dilated.  4. Right atrial size was moderately dilated.  5. The mitral valve is normal in structure. Mild to moderate mitral valve  regurgitation.  6. Tricuspid valve regurgitation is mild to moderate.  7. The aortic valve is tricuspid. Aortic valve regurgitation is mild. No  aortic stenosis is present.   Assessment & Recommendations:  50y.o.Caucasianmalewith hypertension,h/oatrial flutter-treated with ablation by Dr 03/28/2020 (02/2019), recent paroxysmal Afib, hypertension, prediabetes, new diagnosis thyrotoxicosis (03/2020), now admitted with acute on chronic HFpEF, pulmonary hypertension  Pulmonary hypertension: Reviewed echocardiogram. He only has mild to mod MR and I, ad grade 1 DD. Pulmonary hypertension appears out of proportion to his left heart findings. I wonder if he has grp 1 PAH. He will need outpatient workup for this with pulm eval, VQ scan, RHC after he is not infectious for COVID. Until then, needs aggressive diuresis,  Increase lasix to 80 mg bid.   Elevated liver enzymes: Combination of congestive hepatopathy and thyrotoxicosis. Expect this to improve.   Paroxysmal Afib: Currently in sinus rhythm Converted to sinus rhythm on 03/26/20 evening Continueflecainide 50 mg bid, diltiazem PO 180 mg, propranolol 20 mg tid     03/28/20, MD Pager:  [phone] Office: [phone]

## 2020-04-07 NOTE — Progress Notes (Signed)
PROGRESS NOTE    Kevin Branch  OVF:[id number] DOB: 1970-01-26 DOA: 04/04/2020 PCP: Doreene Nest, NP    Chief Complaint  Patient presents with  . Leg Swelling  . Edema  . Shortness of Breath    Brief Narrative:  50 year old male with past medical history of atrial flutter status post ablation2/21, COVID-19 infection(01/2020),diabetes mellitus type 2, hyperlipidemia, hypertension and recent diagnosis of hyperthyroidism currently on methimazole who presents to [facility] emergency department with complaints of bilateral lower extremity edema, increasing abdominal girth and scrotal edema. He was admitted for CHF, started on IV lasix 40 mg BID. Pt seen and examined at bedside.  pts pedal edema is improving. No new complaints.    Assessment & Plan:   Principal Problem:   Acute congestive heart failure (HCC) Active Problems:   Essential hypertension   Type 2 diabetes mellitus without complication, without long-term current use of insulin (HCC)   Paroxysmal atrial fibrillation (HCC)   Hyperthyroidism   COVID-19 virus infection   Acute on chronic diastolic heart failure driven by hyperthyroidism, patient reports that he used to drink 100 ounces of water every day.  He was started on IV Lasix 40 mg twice daily and diuresing appropriately, the urine output is not measured accurately.  Continue with strict intake and output and daily weights    Paroxysmal atrial fibrillation Patient currently in sinus rhythm, rate between 90 to 110/min.  Continue with flecainide Cardizem and propranolol Continue with Eliquis for anticoagulation.   Hyperthyroidism Continue with methimazole 10 mg twice daily   Incidental finding of COVID-19 virus infection Patient is currently asymptomatic he is on room air denies any cough or shortness of breath. Patient is on IV remdesivir, completed the course of remdesivir.  Continue to monitor inflammatory markers. Elevated d dimer, but pt  is already on eliquis.    Essential hypertension Well controlled bp parameters.     Type 2 diabetes mellitus Continue sliding scale insulin. CBG (last 3)  Recent Labs    04/06/20 2039 04/07/20 0815 04/07/20 1135  GLUCAP 112* 78 120*      DVT prophylaxis: ELIQUIS Code Status: FULL CODE.  Family Communication: NONE AT BEDSIDE.  Disposition:   Status is: Observation  The patient will require care spanning > 2 midnights and should be moved to inpatient because: Ongoing diagnostic testing needed not appropriate for outpatient work up and IV treatments appropriate due to intensity of illness or inability to take PO  Dispo: The patient is from: Home              Anticipated d/c is to: Home              Patient currently is not medically stable to d/c.   Difficult to place patient No       Consultants:   Cardiology   Procedures:    Antimicrobials: none.    Subjective:  leg edema improving.   Objective: Vitals:   04/06/20 1619 04/06/20 2040 04/06/20 2324 04/07/20 0331  BP: (!) 117/53 (!) 104/52 (!) 102/52 (!) 116/57  Pulse: 60 (!) 56 (!) 53 61  Resp: 18 (!) 24 (!) 23 16  Temp: 98.4 F (36.9 C) 98.1 F (36.7 C) 98 F (36.7 C) 98.3 F (36.8 C)  TempSrc: Oral Oral Oral Oral  SpO2: 98% 96% 96% 100%  Weight:    87.5 kg  Height:       No intake or output data in the 24 hours ending 04/07/20 1450 Filed  Weights   04/05/20 0000 04/06/20 0359 04/07/20 0331  Weight: 92.5 kg 87.5 kg 87.5 kg    Examination:  General exam: alert and comfortable, no distress noted.  Respiratory system: air entry fair , no wheezing heard.  Cardiovascular system: S1S2 heard, RRR, no JVD.  Gastrointestinal system: Abdomen is soft, non tender non distended. Bowel sounds heard.  Central nervous system: Alert and oriented, non focal . Extremities: improving pedal edema  Skin: no rashes seen.  Psychiatry:  Mood is appropriate.     Data Reviewed: I have personally reviewed  following labs and imaging studies  CBC: Recent Labs  Lab 04/05/20 0013 04/06/20 0126 04/07/20 0236  WBC 9.3 9.1 9.8  NEUTROABS  --  4.8 4.8  HGB 11.7* 11.4* 11.8*  HCT 35.2* 34.5* 33.9*  MCV 96.7 95.3 95.0  PLT 244 194 203    Basic Metabolic Panel: Recent Labs  Lab 04/05/20 0013 04/06/20 0126 04/07/20 0236  NA 134* 135 134*  K 3.6 3.5 4.0  CL 100 101 100  CO2 27 28 27   GLUCOSE 153* 80 84  BUN 6 6 10   CREATININE 0.56* 0.53* 0.56*  CALCIUM 8.2* 8.1* 8.0*    GFR: Estimated Creatinine Clearance: 117.4 mL/min (A) (by C-G formula based on SCr of 0.56 mg/dL (L)).  Liver Function Tests: Recent Labs  Lab 04/05/20 0943 04/06/20 0126 04/07/20 0236  AST 44* 46* 46*  ALT 25 24 20   ALKPHOS 99 98 87  BILITOT 1.7* 1.4* 1.6*  PROT 6.6 6.6 6.0*  ALBUMIN 2.4* 2.3* 2.2*    CBG: Recent Labs  Lab 04/06/20 1141 04/06/20 1711 04/06/20 2039 04/07/20 0815 04/07/20 1135  GLUCAP 102* 123* 112* 78 120*     Recent Results (from the past 240 hour(s))  Resp Panel by RT-PCR (Flu A&B, Covid) Nasopharyngeal Swab     Status: Abnormal   Collection Time: 04/05/20  3:45 AM   Specimen: Nasopharyngeal Swab; Nasopharyngeal(NP) swabs in vial transport medium  Result Value Ref Range Status   SARS Coronavirus 2 by RT PCR POSITIVE (A) NEGATIVE Final    Comment: RESULT CALLED TO, READ BACK BY AND VERIFIED WITH: L DANIELS RN 04/05/20 0447 JDW (NOTE) SARS-CoV-2 target nucleic acids are DETECTED.  The SARS-CoV-2 RNA is generally detectable in upper respiratory specimens during the acute phase of infection. Positive results are indicative of the presence of the identified virus, but do not rule out bacterial infection or co-infection with other pathogens not detected by the test. Clinical correlation with patient history and other diagnostic information is necessary to determine patient infection status. The expected result is Negative.  Fact Sheet for  Patients: 04/09/20  Fact Sheet for Healthcare Providers: 04/07/20  This test is not yet approved or cleared by the 04/07/20 FDA and  has been authorized for detection and/or diagnosis of SARS-CoV-2 by FDA under an Emergency Use Authorization (EUA).  This EUA will remain in effect (meaning this test can be use d) for the duration of  the COVID-19 declaration under Section 564(b)(1) of the Act, 21 U.S.C. section 360bbb-3(b)(1), unless the authorization is terminated or revoked sooner.     Influenza A by PCR NEGATIVE NEGATIVE Final   Influenza B by PCR NEGATIVE NEGATIVE Final    Comment: (NOTE) The Xpert Xpress SARS-CoV-2/FLU/RSV plus assay is intended as an aid in the diagnosis of influenza from Nasopharyngeal swab specimens and should not be used as a sole basis for treatment. Nasal washings and aspirates are unacceptable for Xpert Xpress SARS-CoV-2/FLU/RSV  testing.  Fact Sheet for Patients: BloggerCourse.com  Fact Sheet for Healthcare Providers: SeriousBroker.it  This test is not yet approved or cleared by the [place] FDA and has been authorized for detection and/or diagnosis of SARS-CoV-2 by FDA under an Emergency Use Authorization (EUA). This EUA will remain in effect (meaning this test can be used) for the duration of the COVID-19 declaration under Section 564(b)(1) of the Act, 21 U.S.C. section 360bbb-3(b)(1), unless the authorization is terminated or revoked.  Performed at [facility], [address], [place], [place] [address]          Radiology Studies: No results found.      Scheduled Meds: . apixaban  5 mg Oral BID  . vitamin C  500 mg Oral Daily  . diltiazem  180 mg Oral Daily  . flecainide  50 mg Oral BID  . furosemide  40 mg Intravenous BID  . insulin aspart  0-15 Units Subcutaneous TID AC & HS  . methimazole  10  mg Oral BID  . propranolol  20 mg Oral TID  . sacubitril-valsartan  1 tablet Oral BID  . sodium chloride flush  3 mL Intravenous Q12H  . zinc sulfate  220 mg Oral Daily   Continuous Infusions: . sodium chloride    . remdesivir 100 mg in NS 100 mL Stopped (04/07/20 0910)     LOS: 1 day        Kathlen Mody, MD [facility]   To contact the attending provider between 7A-7P or the covering provider during after hours 7P-7A, please log into the web site [email or web address] and access using universal [place] password for that web site. If you do not have the password, please call the hospital operator.  04/07/2020, 2:50 PM

## 2020-04-07 NOTE — Progress Notes (Signed)
  Echocardiogram 2D Echocardiogram has been performed.  Augustine Radar 04/07/2020, 12:53 PM

## 2020-04-08 DIAGNOSIS — I509 Heart failure, unspecified: Secondary | ICD-10-CM | POA: Diagnosis not present

## 2020-04-08 DIAGNOSIS — U071 COVID-19: Secondary | ICD-10-CM | POA: Diagnosis not present

## 2020-04-08 DIAGNOSIS — I5031 Acute diastolic (congestive) heart failure: Secondary | ICD-10-CM | POA: Diagnosis not present

## 2020-04-08 LAB — CBC WITH DIFFERENTIAL/PLATELET
Abs Immature Granulocytes: 0.03 10*3/uL (ref 0.00–0.07)
Basophils Absolute: 0 10*3/uL (ref 0.0–0.1)
Basophils Relative: 0 %
Eosinophils Absolute: 0.4 10*3/uL (ref 0.0–0.5)
Eosinophils Relative: 3 %
HCT: 38.1 % — ABNORMAL LOW (ref 39.0–52.0)
Hemoglobin: 12.8 g/dL — ABNORMAL LOW (ref 13.0–17.0)
Immature Granulocytes: 0 %
Lymphocytes Relative: 27 %
Lymphs Abs: 3.2 10*3/uL (ref 0.7–4.0)
MCH: 31.8 pg (ref 26.0–34.0)
MCHC: 33.6 g/dL (ref 30.0–36.0)
MCV: 94.8 fL (ref 80.0–100.0)
Monocytes Absolute: 0.9 10*3/uL (ref 0.1–1.0)
Monocytes Relative: 8 %
Neutro Abs: 7.5 10*3/uL (ref 1.7–7.7)
Neutrophils Relative %: 62 %
Platelets: 235 10*3/uL (ref 150–400)
RBC: 4.02 MIL/uL — ABNORMAL LOW (ref 4.22–5.81)
RDW: 14.6 % (ref 11.5–15.5)
WBC: 12.1 10*3/uL — ABNORMAL HIGH (ref 4.0–10.5)
nRBC: 0 % (ref 0.0–0.2)

## 2020-04-08 LAB — D-DIMER, QUANTITATIVE: D-Dimer, Quant: 3.08 ug/mL-FEU — ABNORMAL HIGH (ref 0.00–0.50)

## 2020-04-08 LAB — COMPREHENSIVE METABOLIC PANEL
ALT: 26 U/L (ref 0–44)
AST: 46 U/L — ABNORMAL HIGH (ref 15–41)
Albumin: 2.5 g/dL — ABNORMAL LOW (ref 3.5–5.0)
Alkaline Phosphatase: 115 U/L (ref 38–126)
Anion gap: 7 (ref 5–15)
BUN: 9 mg/dL (ref 6–20)
CO2: 27 mmol/L (ref 22–32)
Calcium: 8 mg/dL — ABNORMAL LOW (ref 8.9–10.3)
Chloride: 99 mmol/L (ref 98–111)
Creatinine, Ser: 0.61 mg/dL (ref 0.61–1.24)
GFR, Estimated: >60 mL/min (ref >60)
Glucose, Bld: 132 mg/dL — ABNORMAL HIGH (ref 70–99)
Potassium: 3.4 mmol/L — ABNORMAL LOW (ref 3.5–5.1)
Sodium: 133 mmol/L — ABNORMAL LOW (ref 135–145)
Total Bilirubin: 1.2 mg/dL (ref 0.3–1.2)
Total Protein: 7.1 g/dL (ref 6.5–8.1)

## 2020-04-08 LAB — GLUCOSE, CAPILLARY
Glucose-Capillary: 123 mg/dL — ABNORMAL HIGH (ref 70–99)
Glucose-Capillary: 105 mg/dL — ABNORMAL HIGH (ref 70–99)
Glucose-Capillary: 116 mg/dL — ABNORMAL HIGH (ref 70–99)
Glucose-Capillary: 117 mg/dL — ABNORMAL HIGH (ref 70–99)

## 2020-04-08 LAB — C-REACTIVE PROTEIN: CRP: 1.2 mg/dL — ABNORMAL HIGH (ref <1.0)

## 2020-04-08 MED ORDER — TORSEMIDE 20 MG PO TABS
40.0000 mg | ORAL_TABLET | Freq: Two times a day (BID) | ORAL | Status: DC
  Administered 2020-04-08 – 2020-04-09 (×2): 40 mg via ORAL
  Filled 2020-04-08 (×2): qty 2

## 2020-04-08 MED ORDER — POTASSIUM CHLORIDE CRYS ER 20 MEQ PO TBCR
40.0000 meq | EXTENDED_RELEASE_TABLET | Freq: Once | ORAL | Status: AC
  Administered 2020-04-08: 40 meq via ORAL
  Filled 2020-04-08: qty 2

## 2020-04-08 MED ORDER — FUROSEMIDE 10 MG/ML IJ SOLN: 60.0000 mg | Freq: Two times a day (BID) | INTRAMUSCULAR | Status: DC

## 2020-04-08 MED ORDER — POTASSIUM CHLORIDE 20 MEQ PO PACK: 20.0000 meq | PACK | Freq: Every day | ORAL | Status: DC

## 2020-04-08 MED ORDER — POTASSIUM CHLORIDE 20 MEQ PO PACK
40.0000 meq | PACK | Freq: Every day | ORAL | Status: DC
  Administered 2020-04-09: 40 meq via ORAL
  Filled 2020-04-08: qty 2

## 2020-04-08 NOTE — Progress Notes (Signed)
Subjective:  Episode of hypotension with IV lasix 80 mg bid.  Reduced to IV lasix 60 mg.   Objective:  Vital Signs in the last 24 hours: Temp:  [97.8 F (36.6 C)-99.1 F (37.3 C)] 97.8 F (36.6 C) (03/28 1624) Pulse Rate:  [60-68] 61 (03/28 1624) Resp:  [15-18] 15 (03/28 1624) BP: (86-119)/(45-66) 119/65 (03/28 1624) SpO2:  [93 %-97 %] 97 % (03/28 1624)  Intake/Output from previous day: 03/27 0701 - 03/28 0700 In: 985 [P.O.:980; I.V.:5] Out: 1200 [Urine:1200]  Physical Exam Vitals and nursing note reviewed.  Constitutional:      General: He is not in acute distress.    Appearance: He is well-developed.  HENT:     Head: Normocephalic and atraumatic.  Eyes:     Conjunctiva/sclera: Conjunctivae normal.     Pupils: Pupils are equal, round, and reactive to light.  Neck:     Vascular: JVD present.  Cardiovascular:     Rate and Rhythm: Normal rate and regular rhythm.     Pulses: Normal pulses and intact distal pulses.     Heart sounds: No murmur heard.   Pulmonary:     Effort: Pulmonary effort is normal.     Breath sounds: Normal breath sounds. No wheezing or rales.  Abdominal:     General: Bowel sounds are normal.     Palpations: Abdomen is soft.     Tenderness: There is no rebound.  Musculoskeletal:        General: No tenderness. Normal range of motion.     Right lower leg: Edema (1+) present.     Left lower leg: Edema (1+) present.  Lymphadenopathy:     Cervical: No cervical adenopathy.  Skin:    General: Skin is warm and dry.  Neurological:     Mental Status: He is alert and oriented to person, place, and time.     Cranial Nerves: No cranial nerve deficit.      Lab Results: BMP Recent Labs    04/06/20 0126 04/07/20 0236 04/08/20 0247  NA 135 134* 133*  K 3.5 4.0 3.4*  CL 101 100 99  CO2 28 27 27   GLUCOSE 80 84 132*  BUN 6 10 9   CREATININE 0.53* 0.56* 0.61  CALCIUM 8.1* 8.0* 8.0*  GFRNONAA >60 >60 >60    CBC Recent Labs  Lab 04/08/20 0247   WBC 12.1*  RBC 4.02*  HGB 12.8*  HCT 38.1*  PLT 235  MCV 94.8  MCH 31.8  MCHC 33.6  RDW 14.6  LYMPHSABS 3.2  MONOABS 0.9  EOSABS 0.4  BASOSABS 0.0    HEMOGLOBIN A1C Lab Results  Component Value Date   HGBA1C 5.5 03/26/2020   MPG 111.15 03/26/2020    Cardiac Panel (last 3 results) Results for BRAYLAN, FAUL "RANDY" (MRN 03/28/2020) as of 04/06/2020 18:39  Ref. Range 04/05/2020 01:29 04/05/2020 03:30  Troponin I (High Sensitivity) Latest Ref Range: <18 ng/L 16 14   BNP (last 3 results) Recent Labs    03/26/20 0741 04/05/20 0129  BNP 886.5* 1,821.0*    TSH Recent Labs    03/26/20 0740 04/03/20 1155  TSH 0.000* <0.01 Repeated and verified X2.*    Lipid Panel     Component Value Date/Time   CHOL 151 07/03/2016 0933   TRIG 70.0 07/03/2016 0933   HDL 45.20 07/03/2016 0933   CHOLHDL 3 07/03/2016 0933   VLDL 14.0 07/03/2016 0933   LDLCALC 92 07/03/2016 0933     Hepatic Function Panel Recent  Labs    04/05/20 0943 04/06/20 0126 04/07/20 0236 04/08/20 0247  PROT 6.6 6.6 6.0* 7.1  ALBUMIN 2.4* 2.3* 2.2* 2.5*  AST 44* 46* 46* 46*  ALT 25 24 20 26   ALKPHOS 99 98 87 115  BILITOT 1.7* 1.4* 1.6* 1.2  BILIDIR 0.9*  --   --   --   IBILI 0.8  --   --   --     Imaging: CXR 03/26/2020: Low volumes and atelectasis. Some additional hazy interstitial opacity and pulmonary vascular cephalization could reflect early interstitial edema with a layering right pleural effusion. No visible left effusion. No pneumothorax. Cardiac silhouette is slightly enlarged from comparison PA radiograph. Remaining cardiomediastinal contours are unremarkable. Degenerative changes are present in the imaged spine and shoulders.  IMPRESSION: Low volumes and atelectasis.  Some additional hazy interstitial opacity and pulmonary vascular cephalization could reflect early interstitial edema with a layering right pleural effusion.  Slightly increased prominence of the cardiac  silhouette. Possible cardiomegaly versus pericardial effusion.   Cardiac Studies:  EKG 04/05/2020: Sinus rhythm 59 bpm Nonspecific T wave abnormality  EKG3/14/2022: Coarse Afibw/RVR Nonspecific ST-T changes  Echocardiogram3/15/2022: 1. Left ventricular ejection fraction, by estimation, is 50 to 55%. The  left ventricle has low normal function. The left ventricle has no regional  wall motion abnormalities. Left ventricular diastolic function could not  be evaluated.  2. Right ventricular systolic function is normal. The right ventricular  size is severely enlarged. There is moderately elevated pulmonary artery  systolic pressure.  3. Left atrial size was moderately dilated.  4. Right atrial size was moderately dilated.  5. The mitral valve is normal in structure. Mild to moderate mitral valve  regurgitation.  6. Tricuspid valve regurgitation is mild to moderate.  7. The aortic valve is tricuspid. Aortic valve regurgitation is mild. No  aortic stenosis is present.   Assessment & Recommendations:  50y.o.Caucasianmalewith hypertension,h/oatrial flutter-treated with ablation by Dr 03/28/2020 (02/2019), recent paroxysmal Afib, hypertension, prediabetes, new diagnosis thyrotoxicosis (03/2020), now admitted with acute on chronic HFpEF, pulmonary hypertension  Pulmonary hypertension, HFpEF: Reviewed echocardiogram. He only has mild to mod MR and I, and grade 1 DD. However, left atrium is severely dilated. Differentials include WHO Grp II PH owing to recent recurrent Afib. Such PH should improve longer he stays in sinus rhythm. He will need RHC to rule out PAH. Will arrange this outpatient.  Hypotension with IV lasix 80 mg., Switched to oral torsemide 40 mg bid, with Kdur 40 mEq daily.  Okay to discahrge tomorrow.  Outpatient f/u arranged.   Elevated liver enzymes: Combination of congestive hepatopathy and thyrotoxicosis. Expect this to improve.   Paroxysmal  Afib: Currently in sinus rhythm Converted to sinus rhythm on 03/26/20 evening Continueflecainide 50 mg bid, diltiazem PO 180 mg, propranolol 20 mg tid     03/28/20, MD Pager: [phone] Office: [phone]

## 2020-04-08 NOTE — Progress Notes (Signed)
Heart Failure Stewardship Pharmacist Progress Note   PCP: Doreene Nest, NP PCP-Cardiologist: No primary care provider on file.    HPI:  50 yo M with PMH of atrial flutter, HTN, HLD, and obesity. He was recently hospitalized from 03/25/20-03/28/20 with atrial flutter with RVR. An ECHO was done on 03/26/20 and LVEF was 50-55%. He converted to NSR on 03/26/20. Since discharge, he has reported worsening bilateral LE edema, orthopnea, and weight gain and presented back to the ED on 04/05/20. He has been readmitted for acute CHF. Also notably tested positive for COVID-19 this admission (negative last admission). Repeat ECHO done on 04/07/20 and LVEF improved to 65-70% with G1DD and moderately elevated PAP.  Current HF Medications: Furosemide 80 mg IV BID Entresto 24/26 mg BID  Prior to admission HF Medications: Furosemide 20 mg daily  Pertinent Lab Values: . Serum creatinine 0.61, BUN 9, Potassium 3.4, Sodium 133, BNP 1821  Vital Signs: . Weight: 193 lbs (admission weight: 204 lbs) . Blood pressure: 80-100/50s  . Heart rate: 60s   Medication Assistance / Insurance Benefits Check: Does the patient have prescription insurance?  Yes Type of insurance plan: [employer] - commercial insurance  Outpatient Pharmacy:  Prior to admission outpatient [facility] Is the patient willing to use [facility] TOC pharmacy at discharge? Yes Is the patient willing to transition their outpatient pharmacy to utilize a [facility] outpatient pharmacy?   Pending    Assessment: 1. Acute on chronic diastolic CHF (EF 52-84>>13-24%), thought to be driven by hyperthyroidism and afib. NYHA class II symptoms. - Furosemide increased to 80 mg IV BID today - Continue Entresto 24/26 mg BID - Consider starting Farxiga or Jardiance 10 mg daily for HFpEF after further IV diuresis   Plan: 1) Medication changes recommended at this time: - Continue IV diuresis - Will need optimization of GDMT prior to discharge to avoid  another readmission  2) Patient assistance: - Entresto copay $200 per month - can enroll in monthly copay card to lower to $10 per 1-3 month supply - Jardiance copay $200 per month - can enroll in monthly copay card to lower to $10 per 1-3 month supply - Farxiga copay $75 per month - can enroll in monthly copay card to lower to $0 per month  3)  Education  - To be completed prior to discharge  Sharen Hones, PharmD, BCPS Heart Failure Stewardship Pharmacist Phone [phone]

## 2020-04-08 NOTE — TOC Progression Note (Signed)
Transition of Care [facility]) - Progression Note    Patient Details  Name: Kevin Branch MRN: [id number] Date of Birth: 08/01/1970  Transition of Care [facility]) CM/SW Contact  Bartholomew Crews, RN Phone Number: [phone] 04/08/2020, 4:48 PM  Clinical Narrative:     Acknowledging [facility] consult for patient having questions about hospital stay. Spoke with patient on his room phone [phone]. Patient stated that this is his 2nd hospital stay and still waiting on additional cardiac work up. Patient wanting to know what his cost will be and decide if he needed to delay care and ask for early dc. Patient advised that TOC is not the expert for billing and claims. Encouraged patient to reach out to his insurance with specific coverage questions. Encouraged patient to follow through with MD recommendations for care and that delaying recommended care could be detrimental to his health and even more costly. Advised that financial arrangements can be made with hospital and providers if he cannot cover his out of pocket expenses at one time. Patient verbalized understanding and agreed to follow through with MD recommendations. He stated that he had already contacted his insurance via the website and has some understanding of what is covered and that he seems to have already met his out of pocket coverage. Further discussed that it takes time for medical expenses to be processed. Patient expressed appreciation for follow up call.   Expected Discharge Plan: Home/Self Care Barriers to Discharge: Continued Medical Work up  Expected Discharge Plan and Services Expected Discharge Plan: Home/Self Care   Discharge Planning Services: CM Consult Post Acute Care Choice: NA Living arrangements for the past 2 months: Single Family Home                                       Social Determinants of Health (SDOH) Interventions    Readmission Risk Interventions No flowsheet data found.

## 2020-04-08 NOTE — Plan of Care (Signed)
  Problem: Education: Goal: Ability to demonstrate management of disease process will improve Outcome: Progressing Goal: Ability to verbalize understanding of medication therapies will improve Outcome: Progressing Goal: Individualized Educational Video(s) Outcome: Progressing   Problem: Activity: Goal: Capacity to carry out activities will improve Outcome: Progressing   Problem: Education: Goal: Knowledge of General Education information will improve Description: Including pain rating scale, medication(s)/side effects and non-pharmacologic comfort measures Outcome: Progressing   Problem: Cardiac: Goal: Ability to achieve and maintain adequate cardiopulmonary perfusion will improve Outcome: Progressing   Problem: Health Behavior/Discharge Planning: Goal: Ability to manage health-related needs will improve Outcome: Progressing   Problem: Clinical Measurements: Goal: Ability to maintain clinical measurements within normal limits will improve Outcome: Progressing Goal: Will remain free from infection Outcome: Progressing Goal: Diagnostic test results will improve Outcome: Progressing Goal: Respiratory complications will improve Outcome: Progressing Goal: Cardiovascular complication will be avoided Outcome: Progressing   Problem: Clinical Measurements: Goal: Will remain free from infection Outcome: Progressing   Problem: Activity: Goal: Risk for activity intolerance will decrease Outcome: Progressing   Problem: Coping: Goal: Level of anxiety will decrease Outcome: Progressing   Problem: Elimination: Goal: Will not experience complications related to bowel motility Outcome: Progressing Goal: Will not experience complications related to urinary retention Outcome: Progressing   Problem: Pain Managment: Goal: General experience of comfort will improve Outcome: Progressing   Problem: Safety: Goal: Ability to remain free from injury will improve Outcome:  Progressing   Problem: Skin Integrity: Goal: Risk for impaired skin integrity will decrease Outcome: Progressing

## 2020-04-08 NOTE — Progress Notes (Signed)
PROGRESS NOTE    Kevin Branch  KKX:[id number] DOB: 01/13/1970 DOA: 04/04/2020 PCP: Doreene Nest, NP    Chief Complaint  Patient presents with  . Leg Swelling  . Edema  . Shortness of Breath    Brief Narrative:  50 year old male with past medical history of atrial flutter status post ablation2/21, COVID-19 infection(01/2020),diabetes mellitus type 2, hyperlipidemia, hypertension and recent diagnosis of hyperthyroidism currently on methimazole who presents to [facility] emergency department with complaints of bilateral lower extremity edema, increasing abdominal girth and scrotal edema. He was admitted for CHF, started on IV lasix 40 mg BID, Increased to 80 mg BID. With increased lasix, his BP dropped and his lasix was switched to oral 40 mg BID.  Pt reports breathing better. His leg edema has improved.    Assessment & Plan:   Principal Problem:   Acute congestive heart failure (HCC) Active Problems:   Essential hypertension   Type 2 diabetes mellitus without complication, without long-term current use of insulin (HCC)   Paroxysmal atrial fibrillation (HCC)   Hyperthyroidism   COVID-19 virus infection   Pulmonary hypertension, unspecified (HCC)   Acute on chronic diastolic heart failure driven by hyperthyroidism, patient reports that he used to drink 100 ounces of water every day.  He was started on IV Lasix 40 mg twice daily and diuresing appropriately, the urine output is not measured accurately.  Yesterday alone he has diuresed about 4 lit . Echocardiogram showed The  left ventricle has normal function. The left ventricle has no regional  wall motion abnormalities. Left ventricular diastolic parameters are  consistent with Grade I diastolic dysfunction (impaired relaxation).  Right ventricular systolic function is normal. There is moderately  elevated pulmonary artery systolic pressure. Estimated PASP 55 mmHg.  Continue with strict intake and output and  daily weights.  Cardiology on board, initially increased lasix to 80 mg BID,but his BP dropped to 86/45 mmhg, transitioned to oral lasix.     Paroxysmal atrial fibrillation Patient currently in sinus rhythm, rate between 90 to 110/min.  Continue with flecainide Cardizem and propranolol Continue with Eliquis for anticoagulation.   Hyperthyroidism Continue with methimazole 10 mg twice daily   Incidental finding of COVID-19 virus infection Patient is currently asymptomatic he is on room air denies any cough or shortness of breath. Patient is on IV remdesivir, completed the course of remdesivir.  Continue to monitor inflammatory markers. Elevated d dimer, but pt is already on eliquis.    Essential hypertension Well controlled bp parameters.     Type 2 diabetes mellitus Continue sliding scale insulin. CBG (last 3)  Recent Labs    04/07/20 2105 04/08/20 0817 04/08/20 1202  GLUCAP 141* 123* 105*   Resume SSI. Get A1c.    DVT prophylaxis: ELIQUIS Code Status: FULL CODE.  Family Communication: NONE AT BEDSIDE.  Disposition:   Status is: Observation  The patient will require care spanning > 2 midnights and should be moved to inpatient because: Ongoing diagnostic testing needed not appropriate for outpatient work up and IV treatments appropriate due to intensity of illness or inability to take PO  Dispo: The patient is from: Home              Anticipated d/c is to: Home              Patient currently is not medically stable to d/c.   Difficult to place patient No       Consultants:   Cardiology   Procedures:  Antimicrobials: none.    Subjective: Leg edema improving.   Objective: Vitals:   04/07/20 2300 04/08/20 0200 04/08/20 0811 04/08/20 1158  BP:  107/66 (!) 104/58 (!) 86/45  Pulse:  68 63 60  Resp:  Temp: 98.5 F (36.9 C) 99.1 F (37.3 C) 98.4 F (36.9 C) 98.1 F (36.7 C)  TempSrc: Oral Oral Oral Oral  SpO2:  93% 96% 95%   Weight:      Height:        Intake/Output Summary (Last 24 hours) at 04/08/2020 1624 Last data filed at 04/08/2020 0500 Gross per 24 hour  Intake 985 ml  Output 1200 ml  Net -215 ml   Filed Weights   04/05/20 0000 04/06/20 0359 04/07/20 0331  Weight: 92.5 kg 87.5 kg 87.5 kg    Examination:  General exam: alert and comfortable, no JVD.  Respiratory system: Air entry fair, no wheezing heard.  Cardiovascular system: S1S2 heard, RRR no JVD Gastrointestinal system: Abdomen is soft, NR ND BS+ Central nervous system: Alert and oriented, no focal.  Extremities: Improving leg edema.  Skin: no rashes seen.  Psychiatry:  Mood is appropriate.     Data Reviewed: I have personally reviewed following labs and imaging studies  CBC: Recent Labs  Lab 04/05/20 0013 04/06/20 0126 04/07/20 0236 04/08/20 0247  WBC 9.3 9.1 9.8 12.1*  NEUTROABS  --  4.8 4.8 7.5  HGB 11.7* 11.4* 11.8* 12.8*  HCT 35.2* 34.5* 33.9* 38.1*  MCV 96.7 95.3 95.0 94.8  PLT 244 194 203 235    Basic Metabolic Panel: Recent Labs  Lab 04/05/20 0013 04/06/20 0126 04/07/20 0236 04/08/20 0247  NA 134* 135 134* 133*  K 3.6 3.5 4.0 3.4*  CL 100 101 100 99  CO2 GLUCOSE 153* 80 84 132*  BUN CREATININE 0.56* 0.53* 0.56* 0.61  CALCIUM 8.2* 8.1* 8.0* 8.0*    GFR: Estimated Creatinine Clearance: 117.4 mL/min (by C-G formula based on SCr of 0.61 mg/dL).  Liver Function Tests: Recent Labs  Lab 04/05/20 0943 04/06/20 0126 04/07/20 0236 04/08/20 0247  AST 44* 46* 46* 46*  ALT ALKPHOS 99 98 87 115  BILITOT 1.7* 1.4* 1.6* 1.2  PROT 6.6 6.6 6.0* 7.1  ALBUMIN 2.4* 2.3* 2.2* 2.5*    CBG: Recent Labs  Lab 04/07/20 1135 04/07/20 1631 04/07/20 2105 04/08/20 0817 04/08/20 1202  GLUCAP 120* 139* 141* 123* 105*     Recent Results (from the past 240 hour(s))  Resp Panel by RT-PCR (Flu A&B, Covid) Nasopharyngeal Swab     Status: Abnormal   Collection Time: 04/05/20   3:45 AM   Specimen: Nasopharyngeal Swab; Nasopharyngeal(NP) swabs in vial transport medium  Result Value Ref Range Status   SARS Coronavirus 2 by RT PCR POSITIVE (A) NEGATIVE Final    Comment: RESULT CALLED TO, READ BACK BY AND VERIFIED WITH: L DANIELS RN 04/05/20 0447 JDW (NOTE) SARS-CoV-2 target nucleic acids are DETECTED.  The SARS-CoV-2 RNA is generally detectable in upper respiratory specimens during the acute phase of infection. Positive results are indicative of the presence of the identified virus, but do not rule out bacterial infection or co-infection with other pathogens not detected by the test. Clinical correlation with patient history and other diagnostic information is necessary to determine patient infection status. The expected result is Negative.  Fact Sheet for Patients: BloggerCourse.com  Fact Sheet for Healthcare Providers: SeriousBroker.it  This  test is not yet approved or cleared by the [place] and  has been authorized for detection and/or diagnosis of SARS-CoV-2 by FDA under an Emergency Use Authorization (EUA).  This EUA will remain in effect (meaning this test can be use d) for the duration of  the COVID-19 declaration under Section 564(b)(1) of the Act, 21 U.S.C. section 360bbb-3(b)(1), unless the authorization is terminated or revoked sooner.     Influenza A by PCR NEGATIVE NEGATIVE Final   Influenza B by PCR NEGATIVE NEGATIVE Final    Comment: (NOTE) The Xpert Xpress SARS-CoV-2/FLU/RSV plus assay is intended as an aid in the diagnosis of influenza from Nasopharyngeal swab specimens and should not be used as a sole basis for treatment. Nasal washings and aspirates are unacceptable for Xpert Xpress SARS-CoV-2/FLU/RSV testing.  Fact Sheet for Patients: BloggerCourse.com  Fact Sheet for Healthcare Providers: SeriousBroker.it  This test is  not yet approved or cleared by the [place] FDA and has been authorized for detection and/or diagnosis of SARS-CoV-2 by FDA under an Emergency Use Authorization (EUA). This EUA will remain in effect (meaning this test can be used) for the duration of the COVID-19 declaration under Section 564(b)(1) of the Act, 21 U.S.C. section 360bbb-3(b)(1), unless the authorization is terminated or revoked.  Performed at [facility], [address] [place], [place] [address]          Radiology Studies: ECHOCARDIOGRAM LIMITED  Result Date: 04/07/2020    ECHOCARDIOGRAM LIMITED REPORT   Patient Name:   ALOK MINSHALL Date of Exam: 04/07/2020 Medical Rec #:  [id number]      Height:       68.0 in Accession #:    [id number]     Weight:       193.0 lb Date of Birth:  Jul 05, 1970      BSA:          2.013 m Patient Age:    50 years       BP:           116/57 mmHg Patient Gender: M              HR:           56 bpm. Exam Location:  Inpatient Procedure: Cardiac Doppler, Limited Echo, Intracardiac Opacification Agent and            Limited Color Doppler Indications:    Congestive Heart Failure I50.9  History:        Patient has prior history of Echocardiogram examinations, most                 recent 03/26/2020. Arrythmias:Atrial Flutter and Atrial                 Fibrillation; Risk Factors:Hypertension and Dyslipidemia.  Sonographer:    Eulah Pont RDCS Referring Phys: [id number] [facility] J PATWARDHAN IMPRESSIONS  1. Left ventricular ejection fraction, by estimation, is 65 to 70%. The left ventricle has normal function. The left ventricle has no regional wall motion abnormalities. Left ventricular diastolic parameters are consistent with Grade I diastolic dysfunction (impaired relaxation).  2. Right ventricular systolic function is normal. There is moderately elevated pulmonary artery systolic pressure. Estimated PASP 55 mmHg.  3. The mitral valve is grossly normal. Mild to moderate mitral valve regurgitation.  4.  Tricuspid valve regurgitation is mild to moderate.  5. The aortic valve is grossly normal. Aortic valve regurgitation is moderate.  6. Unlike previous study on 03/26/2020, diastolic  function can now be assessed. Estimated PASP marginally increased from 47 mmHg to 55 mmHg. FINDINGS  Left Ventricle: Left ventricular ejection fraction, by estimation, is 65 to 70%. The left ventricle has normal function. The left ventricle has no regional wall motion abnormalities. Definity contrast agent was given IV to delineate the left ventricular  endocardial borders. Left ventricular diastolic parameters are consistent with Grade I diastolic dysfunction (impaired relaxation). Right Ventricle: Right ventricular systolic function is normal. There is moderately elevated pulmonary artery systolic pressure. The tricuspid regurgitant velocity is 3.49 m/s, and with an assumed right atrial pressure of 3 mmHg, the estimated right ventricular systolic pressure is 51.7 mmHg. Left Atrium: Left atrial size was normal in size. Right Atrium: Right atrial size was normal in size. Mitral Valve: The mitral valve is grossly normal. Mild to moderate mitral valve regurgitation. Tricuspid Valve: The tricuspid valve is grossly normal. Tricuspid valve regurgitation is mild to moderate. Aortic Valve: The aortic valve is grossly normal. Aortic valve regurgitation is moderate. Aortic regurgitation PHT measures 513 msec. Aorta: The aortic root and ascending aorta are structurally normal, with no evidence of dilitation. IAS/Shunts: No atrial level shunt detected by color flow Doppler. LEFT VENTRICLE PLAX 2D LVIDd:         5.40 cm  Diastology LVIDs:         3.10 cm  LV e' medial:    6.41 cm/s LV PW:         1.00 cm  LV E/e' medial:  15.4 LV IVS:        1.00 cm  LV e' lateral:   8.68 cm/s LVOT diam:     2.20 cm  LV E/e' lateral: 11.3 LV SV:         115 LV SV Index:   57 LVOT Area:     3.80 cm  RIGHT VENTRICLE RV S prime:     15.00 cm/s TAPSE (M-mode): 3.0 cm  LEFT ATRIUM         Index LA diam:    4.90 cm 2.43 cm/m  AORTIC VALVE LVOT Vmax:   172.00 cm/s LVOT Vmean:  110.000 cm/s LVOT VTI:    0.303 m AI PHT:      513 msec  AORTA Ao Root diam: 3.20 cm MITRAL VALVE               TRICUSPID VALVE MV Area (PHT): 1.78 cm    TR Peak grad:   48.7 mmHg MV Decel Time: 426 msec    TR Vmax:        349.00 cm/s MR PISA:        1.01 cm MR PISA Radius: 0.40 cm    SHUNTS MV E velocity: 98.50 cm/s  Systemic VTI:  0.30 m MV A velocity: 30.60 cm/s  Systemic Diam: 2.20 cm MV E/A ratio:  3.22 Manish Patwardhan MD Electronically signed by Truett Mainland MD Signature Date/Time: 04/07/2020/4:34:40 PM    Final         Scheduled Meds: . apixaban  5 mg Oral BID  . vitamin C  500 mg Oral Daily  . diltiazem  180 mg Oral Daily  . flecainide  50 mg Oral BID  . furosemide  60 mg Intravenous BID  . insulin aspart  0-15 Units Subcutaneous TID AC & HS  . methimazole  10 mg Oral BID  . propranolol  20 mg Oral TID  . sacubitril-valsartan  1 tablet Oral BID  . sodium chloride flush  3 mL Intravenous  Q12H  . zinc sulfate  220 mg Oral Daily   Continuous Infusions: . sodium chloride       LOS: 2 days        Kathlen ModyVijaya Ein Rijo, MD [facility]   To contact the attending provider between 7A-7P or the covering provider during after hours 7P-7A, please log into the web site [email or web address] and access using universal [place] password for that web site. If you do not have the password, please call the hospital operator.  04/08/2020, 4:24 PM

## 2020-04-09 ENCOUNTER — Other Ambulatory Visit: Payer: Self-pay | Admitting: Cardiology

## 2020-04-09 ENCOUNTER — Telehealth: Payer: Self-pay

## 2020-04-09 ENCOUNTER — Other Ambulatory Visit: Payer: 59

## 2020-04-09 LAB — COMPREHENSIVE METABOLIC PANEL
ALT: 25 U/L (ref 0–44)
AST: 44 U/L — ABNORMAL HIGH (ref 15–41)
Albumin: 2.5 g/dL — ABNORMAL LOW (ref 3.5–5.0)
Alkaline Phosphatase: 107 U/L (ref 38–126)
Anion gap: 8 (ref 5–15)
BUN: 10 mg/dL (ref 6–20)
CO2: 29 mmol/L (ref 22–32)
Calcium: 8 mg/dL — ABNORMAL LOW (ref 8.9–10.3)
Chloride: 99 mmol/L (ref 98–111)
Creatinine, Ser: 0.5 mg/dL — ABNORMAL LOW (ref 0.61–1.24)
GFR, Estimated: >60 mL/min (ref >60)
Glucose, Bld: 94 mg/dL (ref 70–99)
Potassium: 3.6 mmol/L (ref 3.5–5.1)
Sodium: 136 mmol/L (ref 135–145)
Total Bilirubin: 1.4 mg/dL — ABNORMAL HIGH (ref 0.3–1.2)
Total Protein: 7.1 g/dL (ref 6.5–8.1)

## 2020-04-09 LAB — CBC WITH DIFFERENTIAL/PLATELET
Abs Immature Granulocytes: 0.03 10*3/uL (ref 0.00–0.07)
Basophils Absolute: 0.1 10*3/uL (ref 0.0–0.1)
Basophils Relative: 1 %
Eosinophils Absolute: 0.5 10*3/uL (ref 0.0–0.5)
Eosinophils Relative: 6 %
HCT: 38.3 % — ABNORMAL LOW (ref 39.0–52.0)
Hemoglobin: 13.2 g/dL (ref 13.0–17.0)
Immature Granulocytes: 0 %
Lymphocytes Relative: 33 %
Lymphs Abs: 3.2 10*3/uL (ref 0.7–4.0)
MCH: 32.1 pg (ref 26.0–34.0)
MCHC: 34.5 g/dL (ref 30.0–36.0)
MCV: 93.2 fL (ref 80.0–100.0)
Monocytes Absolute: 1.1 10*3/uL — ABNORMAL HIGH (ref 0.1–1.0)
Monocytes Relative: 11 %
Neutro Abs: 4.8 10*3/uL (ref 1.7–7.7)
Neutrophils Relative %: 49 %
Platelets: 276 10*3/uL (ref 150–400)
RBC: 4.11 MIL/uL — ABNORMAL LOW (ref 4.22–5.81)
RDW: 14.5 % (ref 11.5–15.5)
WBC: 9.7 10*3/uL (ref 4.0–10.5)
nRBC: 0 % (ref 0.0–0.2)

## 2020-04-09 LAB — GLUCOSE, CAPILLARY
Glucose-Capillary: 97 mg/dL (ref 70–99)
Glucose-Capillary: 83 mg/dL (ref 70–99)

## 2020-04-09 LAB — D-DIMER, QUANTITATIVE: D-Dimer, Quant: 3.13 ug/mL-FEU — ABNORMAL HIGH (ref 0.00–0.50)

## 2020-04-09 LAB — C-REACTIVE PROTEIN: CRP: 1.1 mg/dL — ABNORMAL HIGH (ref <1.0)

## 2020-04-09 MED ORDER — METHIMAZOLE 10 MG PO TABS: 10.0000 mg | ORAL_TABLET | Freq: Two times a day (BID) | ORAL | 0 refills | Status: DC

## 2020-04-09 MED ORDER — DAPAGLIFLOZIN PROPANEDIOL 10 MG PO TABS: 10.0000 mg | ORAL_TABLET | Freq: Every day | ORAL | 2 refills | Status: DC

## 2020-04-09 MED ORDER — TORSEMIDE 40 MG PO TABS: 40.0000 mg | ORAL_TABLET | Freq: Two times a day (BID) | ORAL | 3 refills | Status: DC

## 2020-04-09 MED ORDER — ASCORBIC ACID 500 MG PO TABS: 500.0000 mg | ORAL_TABLET | Freq: Every day | ORAL | Status: DC

## 2020-04-09 MED ORDER — SACUBITRIL-VALSARTAN 24-26 MG PO TABS: 1.0000 | ORAL_TABLET | Freq: Two times a day (BID) | ORAL | 1 refills | Status: DC

## 2020-04-09 MED ORDER — DAPAGLIFLOZIN PROPANEDIOL 10 MG PO TABS
10.0000 mg | ORAL_TABLET | Freq: Every day | ORAL | Status: DC
  Filled 2020-04-09: qty 1

## 2020-04-09 MED FILL — methIMAzole 10 MG TABS: 10 | 30 days supply | Qty: 60 | Fill #0

## 2020-04-09 MED FILL — ENTRESTO 24 MG-26 MG TABLET: 24-26 | 30 days supply | Qty: 60 | Fill #0

## 2020-04-09 MED FILL — TORSEMIDE 20 MG TABLET: 20 | 30 days supply | Qty: 120 | Fill #0

## 2020-04-09 MED FILL — FARXIGA 10 MG TABLET: 10 | 30 days supply | Qty: 30 | Fill #0

## 2020-04-09 NOTE — Discharge Instructions (Signed)

## 2020-04-09 NOTE — TOC Benefit Eligibility Note (Signed)
Transition of Care [facility]) Benefit Eligibility Note    Patient Details  Name: Kevin Branch MRN: [id number] Date of Birth: 1970-11-12   Medication/Dose: Sherryll Burger 24-26 MG  BID  Covered?: Yes  Tier: 3 Drug  Prescription Coverage [facility]  [facility]      [facility]  Spoke with Person/Company/Phone Number:: LUIS  @   [employer] #  [phone]  Co-Pay: $200.00  Prior Approval: No  Deductible: Unmet       Mardene Sayer Phone Number: 04/09/2020, 9:10 AM

## 2020-04-09 NOTE — Telephone Encounter (Signed)
Transition Care Management Unsuccessful Follow-up Telephone Call  Date of discharge and from where:  04/09/2020, [facility]  Attempts:  1st Attempt  Reason for unsuccessful TCM follow-up call:  Left voice message

## 2020-04-09 NOTE — Progress Notes (Signed)
The patient was discharged from 31w19. Patient had all belongings and TOC meds. AVS packet reviewed with patient and he verbalized understanding. Patient is on RA, skin intact, and IV d/c'd. Patient will be taken to wife's vehicle via wheelchair by nurse tech.

## 2020-04-09 NOTE — Progress Notes (Signed)
Heart Failure Stewardship Pharmacist Progress Note   PCP: Doreene Nest, NP PCP-Cardiologist: No primary care provider on file.    HPI:  50 yo M with PMH of atrial flutter, HTN, HLD, and obesity. He was recently hospitalized from 03/25/20-03/28/20 with atrial flutter with RVR. An ECHO was done on 03/26/20 and LVEF was 50-55%. He converted to NSR on 03/26/20. Since discharge, he has reported worsening bilateral LE edema, orthopnea, and weight gain and presented back to the ED on 04/05/20. He has been readmitted for acute CHF. Also notably tested positive for COVID-19 this admission (negative last admission). Repeat ECHO done on 04/07/20 and LVEF improved to 65-70% with G1DD and moderately elevated PAP.  Current HF Medications: Torsemide 40 mg BID Entresto 24/26 mg BID  Prior to admission HF Medications: Furosemide 20 mg daily  Pertinent Lab Values: . Serum creatinine 0.50, BUN 10, Potassium 3.6, Sodium 136, BNP 1821  Vital Signs: . Weight: 193 lbs (admission weight: 204 lbs) . Blood pressure: 100/70s  . Heart rate: 60s   Medication Assistance / Insurance Benefits Check: Does the patient have prescription insurance?  Yes Type of insurance plan: [employer] - commercial insurance  Outpatient Pharmacy:  [facility] Is the patient willing to use [facility] TOC pharmacy at discharge? Yes Is the patient willing to transition their outpatient pharmacy to utilize a [facility] outpatient pharmacy?   Pending    Assessment: 1. Acute on chronic diastolic CHF (EF 96-28>>36-62%), thought to be driven by hyperthyroidism and afib. NYHA class II symptoms. - Continue torsemide 40 mg BID - Continue Entresto 24/26 mg BID - Consider starting Farxiga or Jardiance 10 mg daily for HFpEF   Plan: 1) Medication changes recommended at this time: - Start Farxiga 10 mg daily  2) Patient assistance: - Entresto copay $200 per month - can enroll in monthly copay card to lower to  $10 per 1-3 month supply - Jardiance copay $200 per month - can enroll in monthly copay card to lower to $10 per 1-3 month supply - Farxiga copay $75 per month - can enroll in monthly copay card to lower to $0 per month  Sharen Hones, PharmD, BCPS Heart Failure Stewardship Pharmacist Phone [phone]

## 2020-04-09 NOTE — Telephone Encounter (Signed)
Transition Care Management Follow-up Telephone Call  Date of discharge and from where: 04/09/2020, [facility]   How have you been since you were released from the hospital? Patient is doing much better now.   Any questions or concerns? No  Items Reviewed:  Did the pt receive and understand the discharge instructions provided? Yes   Medications obtained and verified? Yes   Other? No   Any new allergies since your discharge? No   Dietary orders reviewed? Yes  Do you have support at home? Yes   Home Care and Equipment/Supplies: Were home health services ordered? not applicable If so, what is the name of the agency? N/A  Has the agency set up a time to come to the patient's home? not applicable Were any new equipment or medical supplies ordered?  No What is the name of the medical supply agency? N/A Were you able to get the supplies/equipment? not applicable Do you have any questions related to the use of the equipment or supplies? No  Functional Questionnaire: (I = Independent and D = Dependent) ADLs: I  Bathing/Dressing- I  Meal Prep- I  Eating- I  Maintaining continence- I  Transferring/Ambulation- I  Managing Meds- I  Follow up appointments reviewed:   PCP Hospital f/u appt confirmed? Yes  Scheduled to see Vernona Rieger, NP on 04/23/2020 @ 12 pm .  Specialist Hospital f/u appt confirmed? No    Are transportation arrangements needed? No   If their condition worsens, is the pt aware to call PCP or go to the Emergency Dept.? Yes  Was the patient provided with contact information for the PCP's office or ED? Yes  Was to pt encouraged to call back with questions or concerns? Yes

## 2020-04-09 NOTE — Plan of Care (Signed)
°  Problem: Education: °Goal: Ability to demonstrate management of disease process will improve °Outcome: Progressing °Goal: Ability to verbalize understanding of medication therapies will improve °Outcome: Progressing °Goal: Individualized Educational Video(s) °Outcome: Progressing °  °Problem: Activity: °Goal: Capacity to carry out activities will improve °Outcome: Progressing °  °Problem: Cardiac: °Goal: Ability to achieve and maintain adequate cardiopulmonary perfusion will improve °Outcome: Progressing °  °Problem: Education: °Goal: Knowledge of General Education information will improve °Description: Including pain rating scale, medication(s)/side effects and non-pharmacologic comfort measures °Outcome: Progressing °  °Problem: Health Behavior/Discharge Planning: °Goal: Ability to manage health-related needs will improve °Outcome: Progressing °  °Problem: Clinical Measurements: °Goal: Ability to maintain clinical measurements within normal limits will improve °Outcome: Progressing °Goal: Will remain free from infection °Outcome: Progressing °Goal: Diagnostic test results will improve °Outcome: Progressing °Goal: Respiratory complications will improve °Outcome: Progressing °Goal: Cardiovascular complication will be avoided °Outcome: Progressing °  °Problem: Activity: °Goal: Risk for activity intolerance will decrease °Outcome: Progressing °  °Problem: Nutrition: °Goal: Adequate nutrition will be maintained °Outcome: Progressing °  °Problem: Coping: °Goal: Level of anxiety will decrease °Outcome: Progressing °  °Problem: Elimination: °Goal: Will not experience complications related to bowel motility °Outcome: Progressing °Goal: Will not experience complications related to urinary retention °Outcome: Progressing °  °Problem: Pain Managment: °Goal: General experience of comfort will improve °Outcome: Progressing °  °Problem: Safety: °Goal: Ability to remain free from injury will improve °Outcome: Progressing °   °Problem: Skin Integrity: °Goal: Risk for impaired skin integrity will decrease °Outcome: Progressing °  °Problem: Education: °Goal: Knowledge of risk factors and measures for prevention of condition will improve °Outcome: Progressing °  °Problem: Coping: °Goal: Psychosocial and spiritual needs will be supported °Outcome: Progressing °  °Problem: Respiratory: °Goal: Will maintain a patent airway °Outcome: Progressing °Goal: Complications related to the disease process, condition or treatment will be avoided or minimized °Outcome: Progressing °  °

## 2020-04-10 ENCOUNTER — Other Ambulatory Visit (HOSPITAL_COMMUNITY): Payer: Self-pay

## 2020-04-10 ENCOUNTER — Other Ambulatory Visit: Payer: Self-pay

## 2020-04-10 DIAGNOSIS — I483 Typical atrial flutter: Secondary | ICD-10-CM

## 2020-04-10 DIAGNOSIS — U071 COVID-19: Secondary | ICD-10-CM

## 2020-04-10 DIAGNOSIS — I509 Heart failure, unspecified: Secondary | ICD-10-CM

## 2020-04-10 NOTE — Patient Outreach (Signed)
[facility]) Care Management  04/10/2020  Kevin Branch [birth year] [id number]  [facility] [[facility]]  Accountable Care Organization [ACO] Patient: [employer] [facility]  [place], Embedded provider   This patient is also in an Education officer, museum which has a chronic disease management Embedded Care Management team.  Plan: Notification sent to the [facility] Embedded Care Management for post hospital follow up.  Please contact for further questions,  Charlesetta Shanks, RN BSN CCM [facility]  [phone] business mobile phone Toll free office [phone]  Fax number: [phone] [name]

## 2020-04-17 NOTE — Discharge Summary (Signed)
Physician Discharge Summary  OLIS VIVERETTE VWU:[id number] DOB: [birth year] DOA: 04/04/2020  PCP: Doreene Nest, NP  Admit date: 04/04/2020 Discharge date: 04/09/2020  Admitted From: Home.  Disposition:  Home.   Recommendations for Outpatient Follow-up:  1. Follow up with PCP in 1-2 weeks 2. Please obtain BMP/CBC in one week 3. Please follow up with cardiology in one week.    Discharge Condition: stable.  CODE STATUS:full code.  Diet recommendation: Heart Healthy   Brief/Interim Summary: 50 year old male with past medical history of atrial flutter status post ablation2/21, COVID-19 infection(01/2020),diabetes mellitus type 2, hyperlipidemia, hypertension and recent diagnosis of hyperthyroidism currently on methimazole who presents to [facility] emergency department with complaints of bilateral lower extremity edema, increasing abdominal girth and scrotal edema.He was admitted for CHF, started on IV lasix 40 mg BID, Increased to 80 mg BID. With increased lasix, his BP dropped and his lasix was switched to oral 40 mg BID.   Discharge Diagnoses:  Principal Problem:   Acute congestive heart failure (HCC) Active Problems:   Essential hypertension   Type 2 diabetes mellitus without complication, without long-term current use of insulin (HCC)   Paroxysmal atrial fibrillation (HCC)   Hyperthyroidism   COVID-19 virus infection   Pulmonary hypertension, unspecified (HCC)   Acute on chronic diastolic heart failure driven by hyperthyroidism, patient reports that he used to drink 100 ounces of water every day.  He was started on IV Lasix 40 mg twice daily and diuresing appropriately, the urine output is not measured accurately. Transitioned to oral torsemide on discharge.  Cardiology consulted and recommendations given.  Echocardiogram showed The left ventricle has normal function. The left ventricle has no regional  wall motion abnormalities. Left ventricular diastolic  parameters are  consistent with Grade I diastolic dysfunction (impaired relaxation).  Right ventricular systolic function is normal. There is moderately  elevated pulmonary artery systolic pressure. Estimated PASP 55 mmHg.  Continue with strict intake and output and daily weights.      Paroxysmal atrial fibrillation Patient currently in sinus rhythm, rate between 90 to 110/min.  Continue with flecainide Cardizem and propranolol Continue with Eliquis for anticoagulation.   Hyperthyroidism Continue with methimazole 10 mg twice daily   Incidental finding of COVID-19 virus infection Patient is currently asymptomatic he is on room air denies any cough or shortness of breath. Patient is on IV remdesivir, completed the course of remdesivir.  Continue to monitor inflammatory markers. Elevated d dimer, but pt is already on eliquis.    Essential hypertension Well controlled bp parameters.     Type 2 diabetes mellitus Resume home meds.   Discharge Instructions  Discharge Instructions    Diet - low sodium heart healthy   Complete by: As directed    Discharge instructions   Complete by: As directed    Follow up with PCP ino ne week to check bmp/kidney function.   Increase activity slowly   Complete by: As directed      Allergies as of 04/09/2020   No Known Allergies     Medication List    STOP taking these medications   diltiazem 30 MG tablet Commonly known as: Cardizem   furosemide 20 MG tablet Commonly known as: Lasix     TAKE these medications   acetaminophen 325 MG tablet Commonly known as: TYLENOL Take 650 mg by mouth every 6 (six) hours as needed for mild pain.   apixaban 5 MG Tabs tablet Commonly known as: ELIQUIS Take 1 tablet (5 mg  total) by mouth 2 (two) times daily.   ascorbic acid 500 MG tablet Commonly known as: VITAMIN C Take 1 tablet (500 mg total) by mouth daily.   calcium carbonate 500 MG chewable tablet Commonly known as: TUMS  - dosed in mg elemental calcium Chew 2 tablets by mouth daily as needed for indigestion or heartburn.   dapagliflozin propanediol 10 MG Tabs tablet Commonly known as: Farxiga Take 1 tablet (10 mg total) by mouth daily.   diltiazem 180 MG 24 hr capsule Commonly known as: CARDIZEM CD Take 1 capsule (180 mg total) by mouth daily.   flecainide 50 MG tablet Commonly known as: TAMBOCOR Take 1 tablet (50 mg total) by mouth 2 (two) times daily.   methimazole 10 MG tablet Commonly known as: TAPAZOLE Take 1 tablet (10 mg total) by mouth 2 (two) times daily.   ONE-A-DAY VITACRAVES ADULT PO Take 1 tablet by mouth daily.   potassium chloride 10 MEQ tablet Commonly known as: KLOR-CON Take 1 tablet (10 mEq total) by mouth daily.   propranolol 20 MG tablet Commonly known as: INDERAL Take 1 tablet (20 mg total) by mouth 3 (three) times daily.   sacubitril-valsartan 24-26 MG Commonly known as: ENTRESTO Take 1 tablet by mouth 2 (two) times daily.   Torsemide 40 MG Tabs Take 40 mg by mouth 2 (two) times daily.       Follow-up Information    Doreene Nest, NP Follow up in 1 week(s).   Specialty: Internal Medicine Contact information: [address] [place] [address] [phone]              No Known Allergies  Consultations:  None.    Procedures/Studies: DG Chest 2 View  Result Date: 04/05/2020 CLINICAL DATA:  Shortness of breath, leg swelling EXAM: CHEST - 2 VIEW COMPARISON:  03/25/2020 FINDINGS: Low volumes and atelectasis. Some additional hazy interstitial opacity and pulmonary vascular cephalization could reflect early interstitial edema with a layering right pleural effusion. No visible left effusion. No pneumothorax. Cardiac silhouette is slightly enlarged from comparison PA radiograph. Remaining cardiomediastinal contours are unremarkable. Degenerative changes are present in the imaged spine and shoulders. IMPRESSION: Low volumes and atelectasis. Some  additional hazy interstitial opacity and pulmonary vascular cephalization could reflect early interstitial edema with a layering right pleural effusion. Slightly increased prominence of the cardiac silhouette. Possible cardiomegaly versus pericardial effusion. Electronically Signed   By: Kreg Shropshire M.D.   On: 04/05/2020 00:31   DG Chest 2 View  Result Date: 03/25/2020 CLINICAL DATA:  Chest tightness and shortness of breath with palpitations. EXAM: CHEST - 2 VIEW COMPARISON:  March 05, 2020 FINDINGS: The heart size and mediastinal contours are within normal limits. Low lung volumes are noted. Both lungs are clear. The visualized skeletal structures are unremarkable. IMPRESSION: No active cardiopulmonary disease. Electronically Signed   By: Aram Candela M.D.   On: 03/25/2020 23:45   ECHOCARDIOGRAM COMPLETE  Result Date: 03/26/2020    ECHOCARDIOGRAM REPORT   Patient Name:   Kevin Branch Date of Exam: 03/26/2020 Medical Rec #:  [id number]      Height:       68.0 in Accession #:    [id number]     Weight:       202.8 lb Date of Birth:  [birth year]      BSA:          2.056 m Patient Age:    49 years       BP:  122/70 mmHg Patient Gender: M              HR:           83 bpm. Exam Location:  Inpatient Procedure: 2D Echo, Cardiac Doppler, Color Doppler and Intracardiac            Opacification Agent Indications:    Congestive Heart Failure I50.9  History:        Patient has prior history of Echocardiogram examinations, most                 recent 03/01/2019. Arrythmias:Atrial Flutter; Risk                 Factors:Hypertension, Dyslipidemia and Current Smoker.  Sonographer:    Renella Cunas RDCS Referring Phys: [id number] [facility] IMPRESSIONS  1. Left ventricular ejection fraction, by estimation, is 50 to 55%. The left ventricle has low normal function. The left ventricle has no regional wall motion abnormalities. Left ventricular diastolic function could not be evaluated.  2. Right ventricular  systolic function is normal. The right ventricular size is severely enlarged. There is moderately elevated pulmonary artery systolic pressure.  3. Left atrial size was moderately dilated.  4. Right atrial size was moderately dilated.  5. The mitral valve is normal in structure. Mild to moderate mitral valve regurgitation.  6. Tricuspid valve regurgitation is mild to moderate.  7. The aortic valve is tricuspid. Aortic valve regurgitation is mild. No aortic stenosis is present. Comparison(s): Prior images unable to be directly viewed, comparison made by report only. FINDINGS  Left Ventricle: Left ventricular ejection fraction, by estimation, is 50 to 55%. The left ventricle has low normal function. The left ventricle has no regional wall motion abnormalities. Definity contrast agent was given IV to delineate the left ventricular endocardial borders. The left ventricular internal cavity size was normal in size. There is no left ventricular hypertrophy. Left ventricular diastolic function could not be evaluated due to atrial fibrillation. Left ventricular diastolic function could not be evaluated. Right Ventricle: The right ventricular size is severely enlarged. No increase in right ventricular wall thickness. Right ventricular systolic function is normal. There is moderately elevated pulmonary artery systolic pressure. The tricuspid regurgitant velocity is 2.83 m/s, and with an assumed right atrial pressure of 15 mmHg, the estimated right ventricular systolic pressure is 47.0 mmHg. Left Atrium: Left atrial size was moderately dilated. Right Atrium: Right atrial size was moderately dilated. Pericardium: There is no evidence of pericardial effusion. Mitral Valve: The mitral valve is normal in structure. Mild to moderate mitral valve regurgitation, with centrally-directed jet. Tricuspid Valve: The tricuspid valve is normal in structure. Tricuspid valve regurgitation is mild to moderate. Aortic Valve: The aortic valve is  tricuspid. Aortic valve regurgitation is mild. No aortic stenosis is present. Pulmonic Valve: The pulmonic valve was grossly normal. Pulmonic valve regurgitation is not visualized. Aorta: The aortic root is normal in size and structure. IAS/Shunts: No atrial level shunt detected by color flow Doppler.  LEFT VENTRICLE PLAX 2D LVIDd:         5.00 cm LVIDs:         3.70 cm LV PW:         1.00 cm LV IVS:        1.00 cm LVOT diam:     2.30 cm LV SV:         106 LV SV Index:   52 LVOT Area:     4.15 cm  LV Volumes (  MOD) LV vol d, MOD A4C: 120.0 ml LV vol s, MOD A4C: 60.9 ml LV SV MOD A4C:     120.0 ml RIGHT VENTRICLE RV Basal diam:  5.27 cm RV S prime:     18.30 cm/s TAPSE (M-mode): 1.7 cm LEFT ATRIUM             Index       RIGHT ATRIUM           Index LA diam:        4.40 cm 2.14 cm/m  RA Area:     26.30 cm LA Vol (A2C):   72.6 ml 35.31 ml/m RA Volume:   94.90 ml  46.16 ml/m LA Vol (A4C):   76.5 ml 37.21 ml/m LA Biplane Vol: 75.7 ml 36.82 ml/m  AORTIC VALVE LVOT Vmax:   161.00 cm/s LVOT Vmean:  110.000 cm/s LVOT VTI:    0.256 m  AORTA Ao Root diam: 3.50 cm Ao Asc diam:  3.40 cm TRICUSPID VALVE TR Peak grad:   32.0 mmHg TR Vmax:        283.00 cm/s  SHUNTS Systemic VTI:  0.26 m Systemic Diam: 2.30 cm Rachelle Hora Croitoru MD Electronically signed by Thurmon Fair MD Signature Date/Time: 03/26/2020/11:18:58 AM    Final    VAS [place] LOWER EXTREMITY VENOUS (DVT)  Result Date: 03/26/2020  Lower Venous DVT Study Indications: Edema. Other Indications: Possible CHF. Risk Factors: A-fib None identified. Anticoagulation: Eliquis. Comparison Study: No previous Performing Technologist: Clint Guy RVT  Examination Guidelines: A complete evaluation includes B-mode imaging, spectral Doppler, color Doppler, and power Doppler as needed of all accessible portions of each vessel. Bilateral testing is considered an integral part of a complete examination. Limited examinations for reoccurring indications may be performed as noted. The  reflux portion of the exam is performed with the patient in reverse Trendelenburg.  +---------+---------------+---------+-----------+----------+--------------+ RIGHT    CompressibilityPhasicitySpontaneityPropertiesThrombus Aging +---------+---------------+---------+-----------+----------+--------------+ CFV      Full                                                        +---------+---------------+---------+-----------+----------+--------------+ SFJ      Full                                                        +---------+---------------+---------+-----------+----------+--------------+ FV Prox  Full                                                        +---------+---------------+---------+-----------+----------+--------------+ FV Mid   Full                                                        +---------+---------------+---------+-----------+----------+--------------+ FV DistalFull                                                        +---------+---------------+---------+-----------+----------+--------------+  PFV      Full                                                        +---------+---------------+---------+-----------+----------+--------------+ POP      Full                                                        +---------+---------------+---------+-----------+----------+--------------+ PTV      Full                                                        +---------+---------------+---------+-----------+----------+--------------+ PERO     Full                                                        +---------+---------------+---------+-----------+----------+--------------+   +---------+---------------+---------+-----------+----------+--------------+ LEFT     CompressibilityPhasicitySpontaneityPropertiesThrombus Aging +---------+---------------+---------+-----------+----------+--------------+ CFV      Full                                                         +---------+---------------+---------+-----------+----------+--------------+ SFJ      Full                                                        +---------+---------------+---------+-----------+----------+--------------+ FV Prox  Full                                                        +---------+---------------+---------+-----------+----------+--------------+ FV Mid   Full                                                        +---------+---------------+---------+-----------+----------+--------------+ FV DistalFull                                                        +---------+---------------+---------+-----------+----------+--------------+ PFV      Full                                                        +---------+---------------+---------+-----------+----------+--------------+  POP      Full                                                        +---------+---------------+---------+-----------+----------+--------------+ PTV      Full                                                        +---------+---------------+---------+-----------+----------+--------------+ PERO     Full                                                        +---------+---------------+---------+-----------+----------+--------------+  Summary: BILATERAL: - No evidence of deep vein thrombosis seen in the lower extremities, bilaterally. - No evidence of superficial venous thrombosis in the lower extremities, bilaterally. -No evidence of popliteal cyst, bilaterally. RIGHT: - No cystic structure found in the popliteal fossa. - Pulsatile Signals  LEFT: - No cystic structure found in the popliteal fossa. - Pulsatile signals  *See table(s) above for measurements and observations. Electronically signed by Waverly Ferrari MD on 03/26/2020 at 12:34:16 PM.    Final    ECHOCARDIOGRAM LIMITED  Result Date: 04/07/2020    ECHOCARDIOGRAM LIMITED REPORT    Patient Name:   DEAARON FULGHUM Date of Exam: 04/07/2020 Medical Rec #:  [id number]      Height:       68.0 in Accession #:    [id number]     Weight:       193.0 lb Date of Birth:  [birth year]      BSA:          2.013 m Patient Age:    50 years       BP:           116/57 mmHg Patient Gender: M              HR:           56 bpm. Exam Location:  Inpatient Procedure: Cardiac Doppler, Limited Echo, Intracardiac Opacification Agent and            Limited Color Doppler Indications:    Congestive Heart Failure I50.9  History:        Patient has prior history of Echocardiogram examinations, most                 recent 03/26/2020. Arrythmias:Atrial Flutter and Atrial                 Fibrillation; Risk Factors:Hypertension and Dyslipidemia.  Sonographer:    Eulah Pont RDCS Referring Phys: [id number] [facility] J PATWARDHAN IMPRESSIONS  1. Left ventricular ejection fraction, by estimation, is 65 to 70%. The left ventricle has normal function. The left ventricle has no regional wall motion abnormalities. Left ventricular diastolic parameters are consistent with Grade I diastolic dysfunction (impaired relaxation).  2. Right ventricular systolic function is normal. There is moderately elevated pulmonary artery systolic pressure. Estimated PASP 55 mmHg.  3. The mitral valve is grossly normal. Mild to moderate mitral valve regurgitation.  4. Tricuspid valve regurgitation is mild to moderate.  5. The aortic valve is grossly normal. Aortic valve regurgitation is moderate.  6. Unlike previous study on 03/26/2020, diastolic function can now be assessed. Estimated PASP marginally increased from 47 mmHg to 55 mmHg. FINDINGS  Left Ventricle: Left ventricular ejection fraction, by estimation, is 65 to 70%. The left ventricle has normal function. The left ventricle has no regional wall motion abnormalities. Definity contrast agent was given IV to delineate the left ventricular  endocardial borders. Left ventricular diastolic parameters are  consistent with Grade I diastolic dysfunction (impaired relaxation). Right Ventricle: Right ventricular systolic function is normal. There is moderately elevated pulmonary artery systolic pressure. The tricuspid regurgitant velocity is 3.49 m/s, and with an assumed right atrial pressure of 3 mmHg, the estimated right ventricular systolic pressure is 51.7 mmHg. Left Atrium: Left atrial size was normal in size. Right Atrium: Right atrial size was normal in size. Mitral Valve: The mitral valve is grossly normal. Mild to moderate mitral valve regurgitation. Tricuspid Valve: The tricuspid valve is grossly normal. Tricuspid valve regurgitation is mild to moderate. Aortic Valve: The aortic valve is grossly normal. Aortic valve regurgitation is moderate. Aortic regurgitation PHT measures 513 msec. Aorta: The aortic root and ascending aorta are structurally normal, with no evidence of dilitation. IAS/Shunts: No atrial level shunt detected by color flow Doppler. LEFT VENTRICLE PLAX 2D LVIDd:         5.40 cm  Diastology LVIDs:         3.10 cm  LV e' medial:    6.41 cm/s LV PW:         1.00 cm  LV E/e' medial:  15.4 LV IVS:        1.00 cm  LV e' lateral:   8.68 cm/s LVOT diam:     2.20 cm  LV E/e' lateral: 11.3 LV SV:         115 LV SV Index:   57 LVOT Area:     3.80 cm  RIGHT VENTRICLE RV S prime:     15.00 cm/s TAPSE (M-mode): 3.0 cm LEFT ATRIUM         Index LA diam:    4.90 cm 2.43 cm/m  AORTIC VALVE LVOT Vmax:   172.00 cm/s LVOT Vmean:  110.000 cm/s LVOT VTI:    0.303 m AI PHT:      513 msec  AORTA Ao Root diam: 3.20 cm MITRAL VALVE               TRICUSPID VALVE MV Area (PHT): 1.78 cm    TR Peak grad:   48.7 mmHg MV Decel Time: 426 msec    TR Vmax:        349.00 cm/s MR PISA:        1.01 cm MR PISA Radius: 0.40 cm    SHUNTS MV E velocity: 98.50 cm/s  Systemic VTI:  0.30 m MV A velocity: 30.60 cm/s  Systemic Diam: 2.20 cm MV E/A ratio:  3.22 Manish Patwardhan MD Electronically signed by Truett Mainland MD Signature  Date/Time: 04/07/2020/4:34:40 PM    Final    US Abdomen Limited RUQ (LIVER/GB)  Result Date: 04/05/2020 CLINICAL DATA:  Abnormal liver enzymes EXAM: ULTRASOUND ABDOMEN LIMITED RIGHT UPPER QUADRANT COMPARISON:  None available FINDINGS: Gallbladder: No gallstones or wall thickening visualized. No sonographic Murphy sign noted by sonographer. Common bile duct: Diameter: 3 mm Liver: No focal lesion identified. Within normal limits in parenchymal echogenicity. Portal vein is patent  on color Doppler imaging with normal direction of blood flow towards the liver. Other: Small right pleural effusion and rounded hepatic cava. IMPRESSION: 1. Negative liver and gallbladder. 2. Small right pleural effusion and rounded hepatic cava, question right heart dysfunction. Electronically Signed   By: Marnee Spring M.D.   On: 04/05/2020 07:16       Subjective: No new complaints.   Discharge Exam: Vitals:   04/09/20 0000 04/09/20 0445  BP: 98/66 104/72  Pulse:  63  Resp: 20 20  Temp: 98 F (36.7 C) 98.1 F (36.7 C)  SpO2:  96%   Vitals:   04/08/20 1624 04/08/20 2045 04/09/20 0000 04/09/20 0445  BP: 119/65 107/66 98/66 104/72  Pulse: 61 64  63  Resp: 15 17 20 20   Temp: 97.8 F (36.6 C) 97.9 F (36.6 C) 98 F (36.7 C) 98.1 F (36.7 C)  TempSrc: Axillary Oral Oral Oral  SpO2: 97% 98%  96%  Weight:      Height:        General: Pt is alert, awake, not in acute distress Cardiovascular: RRR, S1/S2 +, no rubs, no gallops Respiratory: CTA bilaterally, no wheezing, no rhonchi Abdominal: Soft, NT, ND, bowel sounds + Extremities: no edema, no cyanosis    The results of significant diagnostics from this hospitalization (including imaging, microbiology, ancillary and laboratory) are listed below for reference.     Microbiology: No results found for this or any previous visit (from the past 240 hour(s)).   Labs: BNP (last 3 results) Recent Labs    03/26/20 0741 04/05/20 0129  BNP 886.5*  1,821.0*   Basic Metabolic Panel: No results for input(s): NA, K, CL, CO2, GLUCOSE, BUN, CREATININE, CALCIUM, MG, PHOS in the last 168 hours. Liver Function Tests: No results for input(s): AST, ALT, ALKPHOS, BILITOT, PROT, ALBUMIN in the last 168 hours. No results for input(s): LIPASE, AMYLASE in the last 168 hours. No results for input(s): AMMONIA in the last 168 hours. CBC: No results for input(s): WBC, NEUTROABS, HGB, HCT, MCV, PLT in the last 168 hours. Cardiac Enzymes: No results for input(s): CKTOTAL, CKMB, CKMBINDEX, TROPONINI in the last 168 hours. BNP: Invalid input(s): POCBNP CBG: No results for input(s): GLUCAP in the last 168 hours. D-Dimer No results for input(s): DDIMER in the last 72 hours. Hgb A1c No results for input(s): HGBA1C in the last 72 hours. Lipid Profile No results for input(s): CHOL, HDL, LDLCALC, TRIG, CHOLHDL, LDLDIRECT in the last 72 hours. Thyroid function studies No results for input(s): TSH, T4TOTAL, T3FREE, THYROIDAB in the last 72 hours.  Invalid input(s): FREET3 Anemia work up No results for input(s): VITAMINB12, FOLATE, FERRITIN, TIBC, IRON, RETICCTPCT in the last 72 hours. Urinalysis    Component Value Date/Time   COLORURINE YELLOW 04/05/2020 1323   APPEARANCEUR CLEAR 04/05/2020 1323   LABSPEC 1.010 04/05/2020 1323   PHURINE 6.0 04/05/2020 1323   GLUCOSEU NEGATIVE 04/05/2020 1323   HGBUR NEGATIVE 04/05/2020 1323   BILIRUBINUR NEGATIVE 04/05/2020 1323   KETONESUR NEGATIVE 04/05/2020 1323   PROTEINUR NEGATIVE 04/05/2020 1323   NITRITE NEGATIVE 04/05/2020 1323   LEUKOCYTESUR NEGATIVE 04/05/2020 1323   Sepsis Labs Invalid input(s): PROCALCITONIN,  WBC,  LACTICIDVEN Microbiology No results found for this or any previous visit (from the past 240 hour(s)).   Time coordinating discharge: 33 minutes.  SIGNED:   04/07/2020, MD  [facility] 04/17/2020, 8:46 AM

## 2020-04-21 NOTE — Progress Notes (Signed)
Patient referred by Kevin Koch, NP for atrial flutter  Subjective:   Kevin Branch, male    DOB: 05/17/1970, 50 y.o.   MRN: [id number]   Chief Complaint  Patient presents with  . Atrial Fibrillation  . Follow-up  . Results  . Hospitalization Follow-up   50 y.o. Caucasian male with hypertension, presented to [facility], ED on 02/10/2019 with typical atrial flutter with RVR.  Given onset of symptoms of only 2-3 days, and event successful direct-current cardioversion with conversion to normal rhythm.   Patient was hospitalized 04/04/2020-04/09/2020 for acute on chronic diastolic heart failure given by hypothyroidism, the time patient was also found to be in atrial fibrillation/flutter.  During hospitalization echo cardiogram revealed left atrium severely dilated per Dr. Virgina Branch, suspicious for WHO group 2 PAH versus pulmonary arterial hypertension.  Patient was diuresed and discharged home.  Since discharge patient has reportedly been doing well.  He states edema and shortness of breath have resolved.  Denies chest pain, dyspnea, syncope, near syncope, dizziness.  Denies orthopnea, PND.  He continues to take torsemide 40 mg twice daily.  He is scheduled to see both Dr. Rayann Branch and his PCP later this week with an appointment at endocrinology next week.  Current Outpatient Medications on File Prior to Visit  Medication Sig Dispense Refill  . acetaminophen (TYLENOL) 325 MG tablet Take 650 mg by mouth every 6 (six) hours as needed for mild pain.    Marland Kitchen apixaban (ELIQUIS) 5 MG TABS tablet Take 1 tablet (5 mg total) by mouth 2 (two) times daily. 60 tablet 1  . ascorbic acid (VITAMIN C) 500 MG tablet Take 1 tablet (500 mg total) by mouth daily.    . calcium carbonate (TUMS - DOSED IN MG ELEMENTAL CALCIUM) 500 MG chewable tablet Chew 2 tablets by mouth daily as needed for indigestion or heartburn.    . dapagliflozin propanediol (FARXIGA) 10 MG TABS tablet Take 1 tablet (10 mg total) by mouth  daily. 30 tablet 2  . diltiazem (CARDIZEM CD) 180 MG 24 hr capsule Take 1 capsule (180 mg total) by mouth daily. 60 capsule 6  . flecainide (TAMBOCOR) 50 MG tablet Take 1 tablet (50 mg total) by mouth 2 (two) times daily. 60 tablet 3  . methimazole (TAPAZOLE) 10 MG tablet TAKE 1 TABLET (10 MG TOTAL) BY MOUTH TWO TIMES DAILY. 60 tablet 0  . Multiple Vitamins-Minerals (ONE-A-DAY VITACRAVES ADULT PO) Take 1 tablet by mouth daily.    . potassium chloride (KLOR-CON) 10 MEQ tablet Take 1 tablet (10 mEq total) by mouth daily. 30 tablet 3  . propranolol (INDERAL) 20 MG tablet Take 1 tablet (20 mg total) by mouth 3 (three) times daily. 60 tablet 0   No current facility-administered medications on file prior to visit.    Cardiovascular and other pertinent studies: EKG 04/22/2020:  Sinus bradycardia at a rate of 57 bpm.  Left atrial abnormality.  Normal axis.  Incomplete right bundle branch block.  Nonspecific ST-T changes.  Echocardiogram 04/07/2020: 1. Left ventricular ejection fraction, by estimation, is 65 to 70%. The  left ventricle has normal function. The left ventricle has no regional  wall motion abnormalities. Left ventricular diastolic parameters are  consistent with Grade I diastolic  dysfunction (impaired relaxation).  2. Right ventricular systolic function is normal. There is moderately  elevated pulmonary artery systolic pressure. Estimated PASP 55 mmHg.  3. The mitral valve is grossly normal. Mild to moderate mitral valve  regurgitation.  4. Tricuspid  valve regurgitation is mild to moderate.  5. The aortic valve is grossly normal. Aortic valve regurgitation is  moderate.  6. Unlike previous study on 2/50/0370, diastolic function can now be  assessed. Estimated PASP marginally increased from 47 mmHg to 55 mmHg.  ED EKG3/14/2022: Coarse Afibw/RVR Nonspecific ST-T changes  EKG 03/25/2020:  Sinus rhythm at a rate of 86 bpm.  Left atrial abnormality.  Normal axis.  Incomplete  right bundle branch block. Non-specific ST-T changes, cannot exclude anteroseptal ischemia.   A flutter ablation: 03/03/2019  Cardioversion: 02/10/2019  EKG 02/21/2018: Typical atrial flutter 102 bpm Variable conduction  EKG 02/10/2019: Typical atrial flutter 155 bpm   Recent labs: 04/09/2020: Glucose 94.  BUN/creatinine 10/0.5.  NA/K1 36/3.6.  eGFR >60.  AST 44.  ALT 25.  Albumin 2.5. Hemoglobin/hematocrit 13.2/38.3.  MCV 93.2.  Platelet 276  03/18/2020: Sodium 136, potassium 3.4, BUN 17 creatinine 0.4, AST 54, ALT 34, alk phos 117, GFR >60  03/05/2020:  Hemoglobin 13.5, hematocrit 40.7, MCV 94.9, platelets 204 Sodium 136, potassium 4.1, glucose 143, BUN 14, creatinine 0.62, GFR >60  02/10/2019: Glucose 248, BUN/Cr 12/0.47. EGFR >60. Na/K 137/3.6.  Calcium: 8.6 H/H 13.7/39.6. MCV 91.5. Platelets 171 D-Dimer 0.43  06/2016: Chol 151, TG 70, HDL 45, LDL 92   Review of Systems  Constitutional: Negative for malaise/fatigue and weight gain.  Cardiovascular: Negative for chest pain, claudication, dyspnea on exertion, leg swelling, near-syncope, orthopnea, palpitations, paroxysmal nocturnal dyspnea and syncope.  Respiratory: Negative for shortness of breath.   Hematologic/Lymphatic: Does not bruise/bleed easily.  Gastrointestinal: Negative for melena.  Neurological: Negative for dizziness and weakness.         Vitals:   04/22/20 0932  BP: (!) 91/49  Pulse: 61  Temp: 98.2 F (36.8 C)  SpO2: 99%     Body mass index is 28.59 kg/m. Filed Weights   04/22/20 0932  Weight: 188 lb (85.3 kg)     Objective:   Physical Exam Vitals and nursing note reviewed.  Constitutional:      Appearance: He is well-developed.  Neck:     Vascular: No JVD.  Cardiovascular:     Rate and Rhythm: Regular rhythm. Bradycardia present.     Pulses: Intact distal pulses.          Carotid pulses are 2+ on the right side and 2+ on the left side.      Femoral pulses are 1+ on the right side  and 1+ on the left side.      Popliteal pulses are 1+ on the right side and 1+ on the left side.       Dorsalis pedis pulses are 0 on the right side and 0 on the left side.       Posterior tibial pulses are 1+ on the right side and 1+ on the left side.     Heart sounds: Normal heart sounds, S1 normal and S2 normal. No murmur heard.     Comments: No JVD.  Pulmonary:     Effort: Pulmonary effort is normal.     Breath sounds: Normal breath sounds. No wheezing or rales.  Abdominal:     General: Bowel sounds are normal. There is no distension.     Palpations: Abdomen is soft.  Musculoskeletal:     Right lower leg: No edema.     Left lower leg: No edema.  Skin:    General: Skin is warm and dry.        Assessment & Recommendations:  50 y.o. Caucasian male with hypertension, paroxysmal atrial flutter, history of tobacco use, diabetes mellitus, hyperlipidemia.  Also has history of atrial fibrillation/flutter, status post atrial flutter ablation.  Patient was hospitalized 04/04/2020-04/09/2020 for acute on chronic diastolic heart failure given by hypothyroidism, the time patient was also found to be in atrial fibrillation/flutter.  During hospitalization echo cardiogram revealed left atrium severely dilated, suspicious for Will group 2 PAH versus pulmonary arterial hypertension.  Patient was diuresed and discharged home.  Pulmonary hypertension, HFpEF:  In view of severe left atrium dilation recommend right heart catheterization in order to evaluate underlying pulmonary hypertension.  Suspect pulmonary hypertension may be WHO group 2 in view of mitral regurgitation and diastolic dysfunction, however cannot rule out pulmonary arterial hypertension. As patient is presently feeling well without significant symptoms, will hold off on right heart catheterization. Instead will repeat echocardiogram in early June, will then consider doing right hear tcath if indicated by repeat echocardiogram findings.   Reduced torsemide from 40 mg twice daily to 40 mg once daily with additional doses as needed.  PCP is planning to do lab work later this week per patient.  Recommend checking lipid profile and repeating CMP, CBC, and proBNP.  Paroxysmal atrial flutter: S/p cardioversion 02/10/19.  Status post atrial flutter ablation 03/03/2019. This patients CHA2DS2-VASc Score 2 (HTN, DM) and yearly risk of stroke 2.2%.  Suspect the fibrillation during recent hospitalization was driven by hyperthyroidism which led to high-output heart failure. Since starting methimazole patient has had no known recurrence of atrial fibrillation/flutter.  He is presently in sinus rhythm and tolerating anticoagulation without bleeding diathesis. Continue flecainide, diltiazem, and Eliquis.  Hypertension:  Well controlled.  In fact patient's blood pressure is soft, however he remains asymptomatic. Continue diltiazem, propranolol, Entresto, torsemide.  Follow-up in 8 weeks, sooner if needed, for HFpEF, pulmonary hypertension.   Alethia Berthold, PA-C 04/24/2020, 2:34 PM Office: [phone]

## 2020-04-22 ENCOUNTER — Encounter: Payer: Self-pay | Admitting: Student

## 2020-04-22 ENCOUNTER — Ambulatory Visit: Payer: 59 | Admitting: Student

## 2020-04-22 ENCOUNTER — Other Ambulatory Visit: Payer: Self-pay

## 2020-04-22 VITALS — BP 91/49 | HR 61 | Temp 98.2°F | Ht 68.0 in | Wt 188.0 lb

## 2020-04-22 DIAGNOSIS — I5032 Chronic diastolic (congestive) heart failure: Secondary | ICD-10-CM

## 2020-04-22 DIAGNOSIS — I10 Essential (primary) hypertension: Secondary | ICD-10-CM

## 2020-04-22 DIAGNOSIS — I480 Paroxysmal atrial fibrillation: Secondary | ICD-10-CM

## 2020-04-22 DIAGNOSIS — I272 Pulmonary hypertension, unspecified: Secondary | ICD-10-CM

## 2020-04-22 MED ORDER — ENTRESTO 24-26 MG PO TABS: 1.0000 | ORAL_TABLET | Freq: Two times a day (BID) | ORAL | 3 refills | Status: DC

## 2020-04-22 MED ORDER — TORSEMIDE 40 MG PO TABS: 40.0000 mg | ORAL_TABLET | Freq: Every day | ORAL | 3 refills | Status: DC

## 2020-04-23 ENCOUNTER — Telehealth: Payer: Self-pay

## 2020-04-23 ENCOUNTER — Ambulatory Visit: Payer: 59 | Admitting: Primary Care

## 2020-04-23 DIAGNOSIS — Z0289 Encounter for other administrative examinations: Secondary | ICD-10-CM

## 2020-04-23 NOTE — Telephone Encounter (Signed)
[facility] Night - Client Nonclinical Telephone Record AccessNurse Client [facility] Night - Client Client Site [facility] - Night Physician Vernona Rieger - NP Contact Type Call Who Is Calling Patient / Member / Family / Caregiver Caller Name [name] Caller Phone Number [phone] Patient Name Kevin Branch Patient DOB [birth year] Call Type Message Only Information Provided Reason for Call Request to Reschedule Office Appointment Initial Comment Caller states he need to reschedule his appointment for tomorrow. Additional Comment Provided caller with office hours. Caller declined triage. Disp. Time Disposition Final User 04/22/2020 7:00:28 PM General Information Provided Yes Fenton Foy Call Closed By: Fenton Foy Transaction Date/Time: 04/22/2020 6:57:52 PM (ET)

## 2020-04-24 ENCOUNTER — Encounter: Payer: Self-pay | Admitting: Internal Medicine

## 2020-04-24 ENCOUNTER — Other Ambulatory Visit: Payer: Self-pay

## 2020-04-24 ENCOUNTER — Ambulatory Visit (INDEPENDENT_AMBULATORY_CARE_PROVIDER_SITE_OTHER): Payer: 59 | Admitting: Internal Medicine

## 2020-04-24 ENCOUNTER — Ambulatory Visit
Admit: 2020-04-24 | Discharge: 2020-04-24 | Disposition: A | Discharge status: Home / Self Care | Payer: 59 | Attending: Internal Medicine | Admitting: Internal Medicine

## 2020-04-24 VITALS — BP 106/58 | HR 69 | Ht 68.0 in | Wt 189.2 lb

## 2020-04-24 DIAGNOSIS — E0590 Thyrotoxicosis, unspecified without thyrotoxic crisis or storm: Secondary | ICD-10-CM

## 2020-04-24 DIAGNOSIS — I10 Essential (primary) hypertension: Secondary | ICD-10-CM | POA: Diagnosis not present

## 2020-04-24 DIAGNOSIS — I483 Typical atrial flutter: Secondary | ICD-10-CM | POA: Diagnosis not present

## 2020-04-24 NOTE — Progress Notes (Signed)
PCP: Doreene Nest, NP Primary Cardiologist: Dr Rosemary Holms Primary EP: Dr Johney Frame  Kevin Branch is a 50 y.o. male who presents today for routine electrophysiology followup.  Since last being seen in our clinic, the patient reports doing reasonably well.  He did develop atrial fibrillation in the setting of thyrotoxicosis in March.  He was admitted with AF with RVR and CHF.  He has been started on tapazole and is doing well.  No further arrhythmias.    Today, he denies symptoms of palpitations, chest pain, shortness of breath,  lower extremity edema, dizziness, presyncope, or syncope.  The patient is otherwise without complaint today.   Past Medical History:  Diagnosis Date  . A-fib (HCC)   . Essential hypertension   . Hyperglycemia   . Hyperlipidemia   . Overweight   . Typical atrial flutter [facility])    Past Surgical History:  Procedure Laterality Date  . A-FLUTTER ABLATION N/A 03/03/2019   Procedure: A-FLUTTER ABLATION;  Surgeon: Hillis Range, MD;  Location: [facility] INVASIVE CV LAB;  Service: Cardiovascular;  Laterality: N/A;  . none      ROS- all systems are reviewed and negatives except as per HPI above  Current Outpatient Medications  Medication Sig Dispense Refill  . acetaminophen (TYLENOL) 325 MG tablet Take 650 mg by mouth every 6 (six) hours as needed for mild pain.    Marland Kitchen apixaban (ELIQUIS) 5 MG TABS tablet Take 1 tablet (5 mg total) by mouth 2 (two) times daily. 60 tablet 1  . ascorbic acid (VITAMIN C) 500 MG tablet Take 1 tablet (500 mg total) by mouth daily.    . calcium carbonate (TUMS - DOSED IN MG ELEMENTAL CALCIUM) 500 MG chewable tablet Chew 2 tablets by mouth daily as needed for indigestion or heartburn.    . dapagliflozin propanediol (FARXIGA) 10 MG TABS tablet Take 1 tablet (10 mg total) by mouth daily. 30 tablet 2  . diltiazem (CARDIZEM CD) 180 MG 24 hr capsule Take 1 capsule (180 mg total) by mouth daily. 60 capsule 6  . flecainide (TAMBOCOR) 50 MG tablet  Take 1 tablet (50 mg total) by mouth 2 (two) times daily. 60 tablet 3  . methimazole (TAPAZOLE) 10 MG tablet TAKE 1 TABLET (10 MG TOTAL) BY MOUTH TWO TIMES DAILY. 60 tablet 0  . Multiple Vitamins-Minerals (ONE-A-DAY VITACRAVES ADULT PO) Take 1 tablet by mouth daily.    . potassium chloride (KLOR-CON) 10 MEQ tablet Take 1 tablet (10 mEq total) by mouth daily. 30 tablet 3  . propranolol (INDERAL) 20 MG tablet Take 1 tablet (20 mg total) by mouth 3 (three) times daily. 60 tablet 0  . sacubitril-valsartan (ENTRESTO) 24-26 MG Take 1 tablet by mouth 2 (two) times daily. 60 tablet 3  . Torsemide 40 MG TABS Take 40 mg by mouth daily. With additional doses as needed. 60 tablet 3   No current facility-administered medications for this visit.    Physical Exam: Vitals:   04/24/20 1535  BP: (!) 106/58  Pulse: 69  SpO2: 96%  Weight: 189 lb 3.2 oz (85.8 kg)  Height: 5\' 8"  (1.727 m)    GEN- The patient is well appearing, alert and oriented x 3 today.   Head- normocephalic, atraumatic Eyes-  Sclera clear, conjunctiva pink Ears- hearing intact Oropharynx- clear Lungs- Clear to ausculation bilaterally, normal work of breathing Heart- Regular rate and rhythm, no murmurs, rubs or gallops, PMI not laterally displaced GI- soft, NT, ND, + BS Extremities- no clubbing, cyanosis,  or edema  Wt Readings from Last 3 Encounters:  04/24/20 189 lb 3.2 oz (85.8 kg)  04/22/20 188 lb (85.3 kg)  04/07/20 193 lb (87.5 kg)   Echo 03/26/20- EF 50-55%, moderate biatrial enlargement, severe RV enlargement, mild to moderate MR, mild to moderate TR  ekgs from 3/14 and 03/26/20 show coarse afib, not atrial flutter  EKG tracing ordered today is personally reviewed and shows sinus rhythm 69 bpm, PR 160 msec, QRS 86 msec, QTc 454 msec  Assessment and Plan:  1. Paroxysmal atrial fibrillation/ atrial flutter Sp prior CTI ablation. He has thyrotoxicosis and subsequently developed afib and CHF in March.  He feels "much  better" with thyroid treatment with tapazole.  No changes at this time We will reassess after his thyroid has been treated.  Hopefully we can wean medical therapy then if AF remains quiescent.  2. HTN Stable No change required today  3. Hyperthyroidism Doing well with tapazole No changes   Risks, benefits and potential toxicities for medications prescribed and/or refilled reviewed with patient today.   Return in 3 months  Hillis Range MD, [facility] 04/24/2020 3:51 PM

## 2020-04-24 NOTE — Patient Instructions (Addendum)
Medication Instructions:  Your physician recommends that you continue on your current medications as directed. Please refer to the Current Medication list given to you today.  Labwork: None ordered.  Testing/Procedures: None ordered.  Follow-Up: Your physician wants you to follow-up in: 08/12/20 at 10:45 am with Hillis Range, MD   Any Other Special Instructions Will Be Listed Below (If Applicable).  If you need a refill on your cardiac medications before your next appointment, please call your pharmacy.

## 2020-04-29 ENCOUNTER — Telehealth: Payer: Self-pay

## 2020-04-29 NOTE — Telephone Encounter (Signed)
Pt called regarding his propranolol 20mg , is he still suppose to be taking this medication? He is out and his pharmacy has requested it. I have it on our list from the last visit but it also has a few discontinued messages from another provider.

## 2020-04-29 NOTE — Telephone Encounter (Signed)
Patient has been rescheduled for 4/26

## 2020-04-29 NOTE — Telephone Encounter (Signed)
Yes please refill it. However, verify with patient that PCP has not stopped it.

## 2020-04-30 ENCOUNTER — Other Ambulatory Visit: Payer: Self-pay

## 2020-04-30 MED ORDER — PROPRANOLOL HCL 20 MG PO TABS: 20.0000 mg | ORAL_TABLET | Freq: Three times a day (TID) | ORAL | 0 refills | Status: DC

## 2020-04-30 NOTE — Telephone Encounter (Signed)
Pt stated that his PCP has not stopped it, I sent in a refill to his pharmacy.

## 2020-05-07 ENCOUNTER — Other Ambulatory Visit: Payer: Self-pay | Admitting: Student

## 2020-05-07 ENCOUNTER — Encounter: Payer: Self-pay | Admitting: Primary Care

## 2020-05-07 ENCOUNTER — Other Ambulatory Visit: Payer: Self-pay

## 2020-05-07 ENCOUNTER — Ambulatory Visit (INDEPENDENT_AMBULATORY_CARE_PROVIDER_SITE_OTHER): Payer: 59 | Admitting: Primary Care

## 2020-05-07 DIAGNOSIS — I5031 Acute diastolic (congestive) heart failure: Secondary | ICD-10-CM | POA: Diagnosis not present

## 2020-05-07 DIAGNOSIS — E0590 Thyrotoxicosis, unspecified without thyrotoxic crisis or storm: Secondary | ICD-10-CM

## 2020-05-07 DIAGNOSIS — I480 Paroxysmal atrial fibrillation: Secondary | ICD-10-CM

## 2020-05-07 DIAGNOSIS — I483 Typical atrial flutter: Secondary | ICD-10-CM

## 2020-05-07 DIAGNOSIS — E119 Type 2 diabetes mellitus without complications: Secondary | ICD-10-CM

## 2020-05-07 DIAGNOSIS — I10 Essential (primary) hypertension: Secondary | ICD-10-CM | POA: Diagnosis not present

## 2020-05-07 NOTE — Assessment & Plan Note (Signed)
Low end of normal today, is on an extensive regimen per cardiology.  Continue current regimen per cardiology who does plan to wean off slowly.  Discussed to make slow positional changes to avoid orthostasis.

## 2020-05-07 NOTE — Assessment & Plan Note (Signed)
Resolved as evidence of A1c from March 2022. Will need to continue to monitor.

## 2020-05-07 NOTE — Progress Notes (Signed)
Subjective:    Patient ID: Kevin Branch, male    DOB: 04-27-1970, 50 y.o.   MRN: [id number]  HPI  Kevin Branch is a very pleasant 50 y.o. male with a history of hypertension, atrial flutter, acute CHF, scrotal abscess, type 2 diabetes, hyperthyroidism, hyperlipidemia, Covid-19 infection who presents today to re-establish care and for hospital follow up.  He has not been seen by me since September 2018.  Since his last visit with [place] he has been diagnosed with atrial flutter/fibrillation.  Unfortunately was diagnosed with COVID-19 infection in January 2022.  He presented to the emergency department in February 2022, was noted to be in atrial fibrillation with RVR, underwent ablation in February 2022 and initiated on Eliquis, flecainide, diltiazem.  He presented to [facility] ED on 03/26/2020 for chest pain, noted to have gone into atrial flutter, he was admitted for further evaluation.  During this hospital visit he was noted to have severe thyroid toxicosis for which was thought to be causing his atrial flutter/fibrillation.  He was initiated on methimazole, propanolol, IV Lasix.  Prior to discharge his symptoms had significantly improved,.  He was referred to endocrinology for further treatment.  He was initiated on diltiazem, flecainide, apixaban, methimazole, oral furosemide.  He presented back to the emergency department on 04/05/2020 with reports of generalized edema including abdominal and extremity, shortness of breath.  His exam demonstrated significant peripheral edema.  He was admitted for treatment of acute congestive heart failure suspected to be secondary to hyperthyroidism.  After treatment and stabilization he was discharged home on 04/09/2020.  He was continued on flecainide, apixaban, oral furosemide, methimazole.  He established with the atrial fibrillation clinic in early March and was re-evaluated on 03/29/2020.  He was evaluated by his cardiology group on 04/22/2020, he is now on  diltiazem, propanolol, Entresto, torsemide, Farxiga, apixaban. He was evaluated by his electrophysiologist on 04/24/2020 who recommended continued medication management without electrophysiologic intervention as he had significantly improved.  Today he is feeling much better than he did several weeks ago.  He is compliant to all medications prescribed during both hospitalizations.  He endorses the plan is to monitor heart function with repeat echo soon and to forego catheterization at this time.  He is hopeful that soon these medications will be discontinued.  He has seen endocrinology and has been compliant to his methimazole, he prefers not to undergo iodine treatment for hyperthyroidism.    Wt Readings from Last 3 Encounters:  05/07/20 195 lb (88.5 kg)  04/24/20 189 lb 3.2 oz (85.8 kg)  04/22/20 188 lb (85.3 kg)   BP Readings from Last 3 Encounters:  05/07/20 (!) 104/58  04/24/20 (!) 106/58  04/22/20 (!) 91/49     Review of Systems  Eyes: Negative for visual disturbance.  Respiratory: Negative for shortness of breath.   Cardiovascular: Negative for chest pain and leg swelling.  Neurological: Negative for dizziness and headaches.         Past Medical History:  Diagnosis Date  . A-fib (HCC)   . Essential hypertension   . Hyperglycemia   . Hyperlipidemia   . Overweight   . Typical atrial flutter (HCC)     Social History   Socioeconomic History  . Marital status: Married    Spouse name: Not on file  . Number of children: 6  . Years of education: Not on file  . Highest education level: Not on file  Occupational History  . Not on file  Tobacco Use  .  Smoking status: Current Some Day Smoker    Types: Cigars  . Smokeless tobacco: Former Neurosurgeon    Types: Snuff  . Tobacco comment: once a year  Vaping Use  . Vaping Use: Never used  Substance and Sexual Activity  . Alcohol use: Not Currently  . Drug use: Never  . Sexual activity: Not on file  Other Topics Concern  .  Not on file  Social History Narrative   Married.   6 children.   Works for the [employer].   Lives in [place].   Enjoys hunting and fishing.    Social Determinants of Health   Financial Resource Strain: Not on file  Food Insecurity: Not on file  Transportation Needs: Not on file  Physical Activity: Not on file  Stress: Not on file  Social Connections: Not on file  Intimate Partner Violence: Not on file    Past Surgical History:  Procedure Laterality Date  . A-FLUTTER ABLATION N/A 03/03/2019   Procedure: A-FLUTTER ABLATION;  Surgeon: Hillis Range, MD;  Location: [facility] INVASIVE CV LAB;  Service: Cardiovascular;  Laterality: N/A;  . none      Family History  Problem Relation Age of Onset  . Hyperlipidemia Mother   . Heart disease Mother   . Diabetes Mother   . Hyperlipidemia Father   . Hypertension Sister   . Hyperlipidemia Sister     No Known Allergies  Current Outpatient Medications on File Prior to Visit  Medication Sig Dispense Refill  . acetaminophen (TYLENOL) 325 MG tablet Take 650 mg by mouth every 6 (six) hours as needed for mild pain.    Marland Kitchen apixaban (ELIQUIS) 5 MG TABS tablet Take 1 tablet (5 mg total) by mouth 2 (two) times daily. 60 tablet 1  . ascorbic acid (VITAMIN C) 500 MG tablet Take 1 tablet (500 mg total) by mouth daily.    . calcium carbonate (TUMS - DOSED IN MG ELEMENTAL CALCIUM) 500 MG chewable tablet Chew 2 tablets by mouth daily as needed for indigestion or heartburn.    . dapagliflozin propanediol (FARXIGA) 10 MG TABS tablet Take 1 tablet (10 mg total) by mouth daily. 30 tablet 2  . diltiazem (CARDIZEM CD) 180 MG 24 hr capsule Take 1 capsule (180 mg total) by mouth daily. 60 capsule 6  . flecainide (TAMBOCOR) 50 MG tablet Take 1 tablet (50 mg total) by mouth 2 (two) times daily. 60 tablet 3  . methimazole (TAPAZOLE) 10 MG tablet TAKE 1 TABLET (10 MG TOTAL) BY MOUTH TWO TIMES DAILY. 60 tablet 0  . Multiple Vitamins-Minerals  (ONE-A-DAY VITACRAVES ADULT PO) Take 1 tablet by mouth daily.    . potassium chloride (KLOR-CON) 10 MEQ tablet Take 1 tablet (10 mEq total) by mouth daily. 30 tablet 3  . propranolol (INDERAL) 20 MG tablet Take 1 tablet (20 mg total) by mouth 3 (three) times daily. 60 tablet 0  . sacubitril-valsartan (ENTRESTO) 24-26 MG Take 1 tablet by mouth 2 (two) times daily. 60 tablet 3  . Torsemide 40 MG TABS Take 40 mg by mouth daily. With additional doses as needed. 60 tablet 3   No current facility-administered medications on file prior to visit.    BP (!) 104/58   Pulse 80   Temp 98.3 F (36.8 C) (Temporal)   Wt 195 lb (88.5 kg)   SpO2 96%   BMI 29.65 kg/m  Objective:   Physical Exam Cardiovascular:     Rate and Rhythm: Normal rate and regular rhythm.  Pulmonary:     Effort: Pulmonary effort is normal.     Breath sounds: Normal breath sounds. No wheezing or rales.  Musculoskeletal:     Cervical back: Neck supple.  Skin:    General: Skin is warm and dry.  Neurological:     Mental Status: He is alert and oriented to person, place, and time.  Psychiatric:        Mood and Affect: Mood normal.           Assessment & Plan:      This visit occurred during the SARS-CoV-2 public health emergency.  Safety protocols were in place, including screening questions prior to the visit, additional usage of staff PPE, and extensive cleaning of exam room while observing appropriate contact time as indicated for disinfecting solutions.

## 2020-05-07 NOTE — Assessment & Plan Note (Addendum)
2 hospital admissions for symptoms secondary to severe thyrotoxicosis.  Hospital labs, imaging, notes reviewed  Following with endocrinology, currently on methimazole 10 mg, continue same.

## 2020-05-07 NOTE — Assessment & Plan Note (Signed)
Appears euvolemic today, continue Farxiga 10 mg, diltiazem 180 mg, flecainide 50 mg twice daily, propanolol 20 mg 3 times daily, torsemide 40 mg, Entresto 24-26 mg.  Hospital notes, labs, imaging reviewed.

## 2020-05-07 NOTE — Assessment & Plan Note (Signed)
Secondary to hyperthyroidism, rate and rhythm regular today on exam.  Continue apixaban 5 mg twice daily, propanolol 20 mg 3 times daily, diltiazem 180 mg, flecainide 50 mg twice daily.  Following with cardiology and electrophysiology. 

## 2020-05-07 NOTE — Assessment & Plan Note (Signed)
Secondary to hyperthyroidism, rate and rhythm regular today on exam.  Continue apixaban 5 mg twice daily, propanolol 20 mg 3 times daily, diltiazem 180 mg, flecainide 50 mg twice daily.  Following with cardiology and electrophysiology.

## 2020-05-07 NOTE — Patient Instructions (Signed)
Please schedule a physical to meet with me after Labor Day.  It was a pleasure to see you today!

## 2020-05-08 NOTE — Telephone Encounter (Signed)
Eliquis 5mg  refill request received. Patient is 50 years old, weight-88.5kg, Crea-0.50 on 04/09/20, Diagnosis-Afib, and last seen by Dr. 04/11/20 on 04/24/2020. Dose is appropriate based on dosing criteria. Will send in refill to requested pharmacy.

## 2020-05-10 ENCOUNTER — Other Ambulatory Visit: Payer: Self-pay | Admitting: Internal Medicine

## 2020-05-10 DIAGNOSIS — E0590 Thyrotoxicosis, unspecified without thyrotoxic crisis or storm: Secondary | ICD-10-CM

## 2020-05-13 ENCOUNTER — Other Ambulatory Visit: Payer: Self-pay

## 2020-05-13 ENCOUNTER — Other Ambulatory Visit (INDEPENDENT_AMBULATORY_CARE_PROVIDER_SITE_OTHER): Payer: 59

## 2020-05-13 DIAGNOSIS — E0590 Thyrotoxicosis, unspecified without thyrotoxic crisis or storm: Secondary | ICD-10-CM | POA: Diagnosis not present

## 2020-05-13 LAB — TSH: TSH: <0.01 u[IU]/mL — ABNORMAL LOW (ref 0.35–4.50)

## 2020-05-13 LAB — T4, FREE: Free T4: 0.64 ng/dL (ref 0.60–1.60)

## 2020-05-14 ENCOUNTER — Other Ambulatory Visit (HOSPITAL_COMMUNITY): Payer: Self-pay

## 2020-05-22 ENCOUNTER — Encounter: Payer: Self-pay | Admitting: Internal Medicine

## 2020-05-22 MED ORDER — METHIMAZOLE 10 MG PO TABS: 15.0000 mg | ORAL_TABLET | Freq: Every day | ORAL | 2 refills | Status: DC

## 2020-06-11 ENCOUNTER — Other Ambulatory Visit (HOSPITAL_COMMUNITY): Payer: Self-pay

## 2020-06-12 ENCOUNTER — Other Ambulatory Visit (HOSPITAL_COMMUNITY): Payer: Self-pay | Admitting: Physician Assistant

## 2020-06-17 ENCOUNTER — Ambulatory Visit: Payer: 59 | Admitting: Student

## 2020-06-25 ENCOUNTER — Other Ambulatory Visit: Payer: Self-pay | Admitting: Student

## 2020-07-01 ENCOUNTER — Ambulatory Visit: Payer: 59 | Admitting: Internal Medicine

## 2020-07-03 ENCOUNTER — Other Ambulatory Visit: Payer: Self-pay

## 2020-07-03 ENCOUNTER — Ambulatory Visit: Payer: 59

## 2020-07-03 DIAGNOSIS — I5032 Chronic diastolic (congestive) heart failure: Secondary | ICD-10-CM

## 2020-07-03 DIAGNOSIS — R252 Cramp and spasm: Secondary | ICD-10-CM

## 2020-07-03 DIAGNOSIS — I480 Paroxysmal atrial fibrillation: Secondary | ICD-10-CM

## 2020-07-03 DIAGNOSIS — I272 Pulmonary hypertension, unspecified: Secondary | ICD-10-CM

## 2020-07-04 ENCOUNTER — Ambulatory Visit (INDEPENDENT_AMBULATORY_CARE_PROVIDER_SITE_OTHER): Payer: 59 | Admitting: Internal Medicine

## 2020-07-04 ENCOUNTER — Encounter: Payer: Self-pay | Admitting: Internal Medicine

## 2020-07-04 VITALS — BP 102/60 | HR 72 | Ht 68.0 in | Wt 206.0 lb

## 2020-07-04 DIAGNOSIS — E0590 Thyrotoxicosis, unspecified without thyrotoxic crisis or storm: Secondary | ICD-10-CM | POA: Diagnosis not present

## 2020-07-04 DIAGNOSIS — E0500 Thyrotoxicosis with diffuse goiter without thyrotoxic crisis or storm: Secondary | ICD-10-CM | POA: Insufficient documentation

## 2020-07-04 LAB — T4, FREE: Free T4: 2.12 ng/dL — ABNORMAL HIGH (ref 0.60–1.60)

## 2020-07-04 LAB — TSH: TSH: <0.01 u[IU]/mL — ABNORMAL LOW (ref 0.35–4.50)

## 2020-07-04 NOTE — Progress Notes (Signed)
Name: Kevin Branch  MRN/ DOB: [id number], 1970-08-30    Age/ Sex: 50 y.o., male     PCP: Doreene Nest, NP   Reason for Endocrinology Evaluation: Hyperthyroidism     Initial Endocrinology Clinic Visit: 04/03/2020    PATIENT IDENTIFIER: [name] is a 50 y.o., male with a past medical history of A. Fib ( S/P Ablation ) and hyperthyroidism. He has followed with [facility] since 04/03/2020 for consultative assistance with management of his Hyperthyroidism.   HISTORICAL SUMMARY:  He has diagnosed with hyperthyroidism during his admission for recurrent A. Fib in 03/2020 with a suppressed TSH 0.000 uIU/mL and elevated FT4 at > 5.5 ng/dL . He was started on B-blocker and Methimazole    Thyroid ultrasound was unrevealing 04/2020  TRAB elevated at 34.33 IU/L   Follows with Cardiology - Dr. Rosemary Holms No prior amiodarone use   Mother with thyroid disease  SUBJECTIVE:    Today (07/04/2020):  Kevin Branch is here for hyperthyroidism.   He has been noted with weight gain  Denies constipation or diarrhea  Denies abdominal pain, or fever  NO eye burning or itching, has glaucoma   Methimazole 10 mg , 1.5 tabs daily     HISTORY:  Past Medical History:  Past Medical History:  Diagnosis Date   A-fib (HCC)    Essential hypertension    Hyperglycemia    Hyperlipidemia    Overweight    Typical atrial flutter (HCC)    Past Surgical History:  Past Surgical History:  Procedure Laterality Date   A-FLUTTER ABLATION N/A 03/03/2019   Procedure: A-FLUTTER ABLATION;  Surgeon: Hillis Range, MD;  [facility];  Service: Cardiovascular;  Laterality: N/A;   none     Social History:  reports that he has been smoking cigars. He has quit using smokeless tobacco.  His smokeless tobacco use included snuff. He reports previous alcohol use. He reports that he does not use drugs. Family History:  Family History  Problem Relation Age of Onset    Hyperlipidemia Mother    Heart disease Mother    Diabetes Mother    Hyperlipidemia Father    Hypertension Sister    Hyperlipidemia Sister      HOME MEDICATIONS: Allergies as of 07/04/2020   No Known Allergies      Medication List        Accurate as of July 04, 2020 10:14 AM. If you have any questions, ask your nurse or doctor.          acetaminophen 325 MG tablet Commonly known as: TYLENOL Take 650 mg by mouth every 6 (six) hours as needed for mild pain.   ascorbic acid 500 MG tablet Commonly known as: VITAMIN C Take 1 tablet (500 mg total) by mouth daily.   calcium carbonate 500 MG chewable tablet Commonly known as: TUMS - dosed in mg elemental calcium Chew 2 tablets by mouth daily as needed for indigestion or heartburn.   dapagliflozin propanediol 10 MG Tabs tablet Commonly known as: Farxiga Take 1 tablet (10 mg total) by mouth daily.   diltiazem 180 MG 24 hr capsule Commonly known as: CARDIZEM CD Take 1 capsule (180 mg total) by mouth daily.   Eliquis 5 MG Tabs tablet Generic drug: apixaban TAKE 1 TABLET BY MOUTH TWICE A DAY   Entresto 24-26 MG Generic drug: sacubitril-valsartan Take 1 tablet by mouth 2 (two) times daily.   flecainide 50 MG tablet Commonly known as: [employer]  Take 1 tablet (50 mg total) by mouth 2 (two) times daily.   methimazole 10 MG tablet Commonly known as: TAPAZOLE Take 1.5 tablets (15 mg total) by mouth daily.   ONE-A-DAY VITACRAVES ADULT PO Take 1 tablet by mouth daily.   potassium chloride 10 MEQ tablet Commonly known as: KLOR-CON TAKE 1 TABLET BY MOUTH EVERY DAY   propranolol 20 MG tablet Commonly known as: INDERAL TAKE 1 TABLET BY MOUTH THREE TIMES A DAY   Torsemide 40 MG Tabs Take 40 mg by mouth daily. With additional doses as needed.          OBJECTIVE:   PHYSICAL EXAM: VS: BP 102/60   Pulse 72   Ht 5\' 8"  (1.727 m)   Wt 206 lb (93.4 kg)   SpO2 96%   BMI 31.32 kg/m    EXAM: General: Pt appears  well and is in NAD  Eyes: External eye exam normal with a stare,but no  lid lag or exophthalmos.    Neck: General: Supple without adenopathy. Thyroid: Thyroid size normal.  No goiter or nodules appreciated. No thyroid bruit.  Lungs: Clear with good BS bilat with no rales, rhonchi, or wheezes  Heart: Auscultation: RRR.  Abdomen: Normoactive bowel sounds, soft, nontender, without masses or organomegaly palpable  Extremities:  BL LE: No pretibial edema normal ROM and strength.  Mental Status: Judgment, insight: Intact Orientation: Oriented to time, place, and person Mood and affect: No depression, anxiety, or agitation     DATA REVIEWED: Results for CECILIO, OHLRICH" (MRN Penelope Coop) as of 07/05/2020 16:38  Ref. Range 05/13/2020 10:24 07/04/2020 10:18  TSH Latest Ref Range: 0.35 - 4.50 uIU/mL <0.01 (L) <0.01 (L)  T4,Free(Direct) Latest Ref Range: 0.60 - 1.60 ng/dL 07/06/2020 0.17 (H)     Results for ACEA, YAGI" (MRN Penelope Coop) as of 07/04/2020 07:29  Ref. Range 04/03/2020 11:55  TRAB Latest Ref Range: <=2.00 IU/L 34.33 (H)     Thyroid Ultrasound 04/24/2020  Moderately heterogeneous and enlarged thyroid gland without evidence of focal nodules.  ASSESSMENT / PLAN / RECOMMENDATIONS:   Hyperthyroidism Secondary to Graves' Disease :   - Pt is clinically euthyroid  - No local neck symptoms  - TFT's today show hyperthyroidism, patient admits to imperfect adherence in the past couple weeks, we will increase as below  Medications  Increase methimazole 10 mg, to 2 tablets daily     2. Graves' Disease:   - No extrathyroidal manifestations of Graves' Disease, pt advised to let his ophthalmologist know about Graves' disease   F/U in 4 months  Labs in 8 weeks    Addendum: Discussed results with the patient on 07/05/2020  Signed electronically by: 07/07/2020, MD  [facility]  [facility] [address] [place],  [place] Phone: [phone] FAX: [phone]      CC: [phone], NP [address] [place] Phone: [phone]  Fax: [phone]   Return to Endocrinology clinic as below: Future Appointments  Date Time Provider Department Center  07/10/2020  8:30 AM 07/12/2020, PA-C [facility] None  08/12/2020 10:45 AM 10/12/2020, MD [facility]  10/08/2020  2:00 PM 10/10/2020, NP [facility]

## 2020-07-05 ENCOUNTER — Other Ambulatory Visit (HOSPITAL_COMMUNITY): Payer: Self-pay | Admitting: Physician Assistant

## 2020-07-05 MED ORDER — METHIMAZOLE 10 MG PO TABS: 20.0000 mg | ORAL_TABLET | Freq: Every day | ORAL | 2 refills | Status: DC

## 2020-07-08 NOTE — Progress Notes (Signed)
Patient referred by [name], NP for atrial flutter  Subjective:   Kevin Branch, male    DOB: [birth year], 50 y.o.   MRN: [id number]   Chief Complaint  Patient presents with   Atrial Fibrillation   Results    Echocardiogram   Follow-up   50 y.o. Caucasian male with hypertension, presented to [name] on 02/10/2019 with typical atrial flutter with RVR.  Given onset of symptoms of only 2-3 days, and event successful direct-current cardioversion with conversion to normal rhythm.   Patient was hospitalized 04/04/2020-04/09/2020 for acute on chronic diastolic heart failure given by hypothyroidism, the time patient was also found to be in atrial fibrillation/flutter.  During hospitalization echo cardiogram revealed left atrium severely dilated per Dr. [name], suspicious for WHO group 2 PAH versus pulmonary arterial hypertension.  Patient was diuresed and discharged home.   Patient presents for 8-week follow-up.  At last visit ordered repeat echocardiogram in order to reevaluate need for right heart cath.  Echocardiogram showed no evidence of pulmonary hypertension and only slightly dilated right atrium, as well as improvement of MR, TR, and AR from moderate to mild.  Patient continues to feel well without specific complaints today denies chest pain, dyspnea, syncope, near syncope, dizziness, orthopnea, PND.  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  He continues to take torsemide 20 mg daily.  He is scheduled for labs with his PCP in September.  He has had no known recurrence of atrial fibrillation/flutter since March 2022.  Patient continues to follow closely with endocrinology for management of thyroid dysfunction.  Current Outpatient Medications on File Prior to Visit  Medication Sig Dispense Refill   acetaminophen (TYLENOL) 325 MG tablet Take 650 mg by mouth every 6 (six) hours as needed for mild pain.     ascorbic  acid (VITAMIN C) 500 MG tablet Take 1 tablet (500 mg total) by mouth daily.     calcium carbonate (TUMS - DOSED IN MG ELEMENTAL CALCIUM) 500 MG chewable tablet Chew 2 tablets by mouth daily as needed for indigestion or heartburn.     dapagliflozin propanediol (FARXIGA) 10 MG TABS tablet Take 1 tablet (10 mg total) by mouth daily. 30 tablet 2   diltiazem (CARDIZEM CD) 180 MG 24 hr capsule Take 1 capsule (180 mg total) by mouth daily. 60 capsule 6   ELIQUIS 5 MG TABS tablet TAKE 1 TABLET BY MOUTH TWICE A DAY 60 tablet 5   flecainide (TAMBOCOR) 50 MG tablet TAKE 1 TABLET BY MOUTH TWICE A DAY 180 tablet 3   latanoprost (XALATAN) 0.005 % ophthalmic solution SMARTSIG:1 Drop(s) In Eye(s) Every Evening     methimazole (TAPAZOLE) 10 MG tablet Take 2 tablets (20 mg total) by mouth daily. 180 tablet 2   Multiple Vitamins-Minerals (ONE-A-DAY VITACRAVES ADULT PO) Take 1 tablet by mouth daily.     potassium chloride (KLOR-CON) 10 MEQ tablet TAKE 1 TABLET BY MOUTH EVERY DAY 90 tablet 1   propranolol (INDERAL) 20 MG tablet TAKE 1 TABLET BY MOUTH THREE TIMES A DAY 60 tablet 0   sacubitril-valsartan (ENTRESTO) 24-26 MG Take 1 tablet by mouth 2 (two) times daily. 60 tablet 3   No current facility-administered medications on file prior to visit.    Cardiovascular and other pertinent studies: ABI 07/03/2020: pending   Echocardiogram 07/03/2020:  Normal LV systolic function with visual EF 50-55%. Left ventricle cavity  is normal in size. Mild left ventricular  hypertrophy. Normal global wall  motion. Indeterminate diastolic filling pattern, normal LAP.  Left atrial cavity is moderately dilated.  Right atrial cavity is slightly dilated, visually.  Mild (Grade I) aortic regurgitation.  Mild (Grade I) mitral regurgitation.  Mild tricuspid regurgitation. No evidence of pulmonary hypertension.  Mild pulmonic regurgitation.  Prior study dated 04/07/2020: LVEF 65-70%, mild/moderate MR and TR,  moderate AR.  EKG  04/22/2020:  Sinus bradycardia at a rate of 57 bpm.  Left atrial abnormality.  Normal axis.  Incomplete right bundle branch block.  Nonspecific ST-T changes.  Echocardiogram 04/07/2020: 1. Left ventricular ejection fraction, by estimation, is 65 to 70%. The  left ventricle has normal function. The left ventricle has no regional  wall motion abnormalities. Left ventricular diastolic parameters are  consistent with Grade I diastolic  dysfunction (impaired relaxation).   2. Right ventricular systolic function is normal. There is moderately  elevated pulmonary artery systolic pressure. Estimated PASP 55 mmHg.   3. The mitral valve is grossly normal. Mild to moderate mitral valve  regurgitation.   4. Tricuspid valve regurgitation is mild to moderate.   5. The aortic valve is grossly normal. Aortic valve regurgitation is  moderate.   6. Unlike previous study on 06/09/4130, diastolic function can now be  assessed. Estimated PASP marginally increased from 47 mmHg to 55 mmHg.  ED EKG 03/25/2020: Coarse Afib w/RVR Nonspecific ST-T changes  EKG 03/25/2020:  Sinus rhythm at a rate of 86 bpm.  Left atrial abnormality.  Normal axis.  Incomplete right bundle branch block. Non-specific ST-T changes, cannot exclude anteroseptal ischemia.   A flutter ablation: 03/03/2019  Cardioversion: 02/10/2019  EKG 02/21/2018: Typical atrial flutter 102 bpm Variable conduction  EKG 02/10/2019: Typical atrial flutter 155 bpm   Recent labs: 04/09/2020: Glucose 94.  BUN/creatinine 10/0.5.  NA/K1 36/3.6.  eGFR >60.  AST 44.  ALT 25.  Albumin 2.5. Hemoglobin/hematocrit 13.2/38.3.  MCV 93.2.  Platelet 276  03/18/2020: Sodium 136, potassium 3.4, BUN 17 creatinine 0.4, AST 54, ALT 34, alk phos 117, GFR >60  03/05/2020:  Hemoglobin 13.5, hematocrit 40.7, MCV 94.9, platelets 204 Sodium 136, potassium 4.1, glucose 143, BUN 14, creatinine 0.62, GFR >60  02/10/2019: Glucose 248, BUN/Cr 12/0.47. EGFR >60. Na/K 137/3.6.   Calcium: 8.6 H/H 13.7/39.6. MCV 91.5. Platelets 171 D-Dimer 0.43  06/2016: Chol 151, TG 70, HDL 45, LDL 92   Review of Systems  Constitutional: Negative for malaise/fatigue and weight gain.  Cardiovascular:  Negative for chest pain, claudication, dyspnea on exertion, leg swelling, near-syncope, orthopnea, palpitations, paroxysmal nocturnal dyspnea and syncope.  Respiratory:  Negative for shortness of breath.   Gastrointestinal:  Negative for melena.  Genitourinary:  Negative for hematuria.  Neurological:  Negative for dizziness.        Vitals:   07/10/20 0843  BP: 111/75  Pulse: 79  Resp: 16  Temp: 98.7 F (37.1 C)  SpO2: 98%     Body mass index is 30.87 kg/m. Filed Weights   07/10/20 0843  Weight: 203 lb (92.1 kg)     Objective:   Physical Exam Vitals and nursing note reviewed.  Constitutional:      General: He is not in acute distress.    Appearance: He is well-developed.  Neck:     Vascular: No JVD.  Cardiovascular:     Rate and Rhythm: Normal rate and regular rhythm.     Pulses: Intact distal pulses.          Carotid pulses are 2+ on the  right side and 2+ on the left side.      Femoral pulses are 1+ on the right side and 1+ on the left side.      Popliteal pulses are 1+ on the right side and 1+ on the left side.       Dorsalis pedis pulses are 0 on the right side and 0 on the left side.       Posterior tibial pulses are 1+ on the right side and 1+ on the left side.     Heart sounds: Normal heart sounds, S1 normal and S2 normal. No murmur heard.    Comments: No JVD.  Pulmonary:     Effort: Pulmonary effort is normal.     Breath sounds: Normal breath sounds. No wheezing or rales.  Musculoskeletal:     Right lower leg: No edema.     Left lower leg: No edema.  Skin:    General: Skin is warm and dry.       Assessment & Recommendations:   50 y.o. Caucasian male with hypertension, paroxysmal atrial flutter, history of tobacco use, diabetes mellitus,  hyperlipidemia.  Also has history of atrial fibrillation/flutter, status post atrial flutter ablation.  Patient was hospitalized 04/04/2020-04/09/2020 for acute on chronic diastolic heart failure given by hypothyroidism, the time patient was also found to be in atrial fibrillation/flutter.  During hospitalization echo cardiogram revealed left atrium severely dilated, suspicious for Will group 2 PAH versus pulmonary arterial hypertension.  Patient was diuresed and discharged home.  Pulmonary hypertension, HFpEF:  Previous echocardiogram 03/26/2020 revealed severe left atrium dilation and therefore right heart catheterization was recommended to evaluate for underlying pulmonary hypertension.  It was suspected patient may have a group 2 in view of mitral regurgitation diastolic dysfunction.  However repeat echocardiogram revealed no evidence of pulmonary hypertension, although left atrium is still moderately dilated.  Reviewed and discussed with patient results of echocardiogram, details above.  Compared to previous echocardiogram moderate AR, TR, MR have all improved to mild and LVEF is 50-55%.  Also right atrium is now only slightly dilated.  Patient continues to feel well without significant symptoms and no evidence of heart failure.  We will therefore continue to hold off on right heart catheterization at this time.  This patient is euvolemic on exam and blood pressure is soft advised him to take torsemide 20 mg daily as needed for volume overload rather than on a regular basis.  Patient verbalized understanding and agreement with as needed torsemide dosing.  Will patient's blood pressure is soft he remains asymptomatic.  Will obtain repeat BMP.  Patient is presently scheduled for labs with PCP in September, will request lipid profile, CBC, and proBNP.   Paroxysmal atrial flutter: S/p cardioversion 02/10/19.  Status post atrial flutter ablation 03/03/2019. This patients CHA2DS2-VASc Score 2 (HTN, DM) and  yearly risk of stroke 2.2%.  Episode of atrial fibrillation in March 2022 likely driven by hyperthyroidism which led to high-output heart failure.  Patient now follows with endocrinology for management of thyroid dysfunction, presently on methimazole. He has had no known recurrence of atrial fibrillation/flutter.  Scheduled to see Dr. [name] in August.  Can consider weaning off of atrial fibrillation/flutter medications at that time. For now we will continue flecainide, diltiazem, Eliquis.  He is tolerating anticoagulation without bleeding diathesis.  Hypertension:  Well controlled.  In fact patient's blood pressure is soft, however he remains asymptomatic. Continue diltiazem, propranolol, Entresto.  Switch torsemide from daily to as needed dosing.  Follow-up  in 3 months, sooner if needed, for HFpEF, atrial fibrillation/flutter, hypertension.   Alethia Berthold, PA-C 07/10/2020, 10:00 AM Office: [phone]

## 2020-07-10 ENCOUNTER — Encounter: Payer: Self-pay | Admitting: Student

## 2020-07-10 ENCOUNTER — Ambulatory Visit: Payer: 59 | Admitting: Student

## 2020-07-10 ENCOUNTER — Other Ambulatory Visit: Payer: Self-pay

## 2020-07-10 VITALS — BP 111/75 | HR 79 | Temp 98.7°F | Resp 16 | Ht 68.0 in | Wt 203.0 lb

## 2020-07-10 DIAGNOSIS — I483 Typical atrial flutter: Secondary | ICD-10-CM

## 2020-07-10 DIAGNOSIS — I10 Essential (primary) hypertension: Secondary | ICD-10-CM

## 2020-07-10 MED ORDER — TORSEMIDE 40 MG PO TABS: 20.0000 mg | ORAL_TABLET | ORAL | 3 refills | Status: DC | PRN

## 2020-07-11 LAB — BASIC METABOLIC PANEL
BUN/Creatinine Ratio: 15 (ref 9–20)
BUN: 13 mg/dL (ref 6–24)
CO2: 27 mmol/L (ref 20–29)
Calcium: 9.6 mg/dL (ref 8.7–10.2)
Chloride: 92 mmol/L — ABNORMAL LOW (ref 96–106)
Creatinine, Ser: 0.86 mg/dL (ref 0.76–1.27)
Glucose: 202 mg/dL — ABNORMAL HIGH (ref 65–99)
Potassium: 4.6 mmol/L (ref 3.5–5.2)
Sodium: 135 mmol/L (ref 134–144)
eGFR: 105 mL/min/{1.73_m2} (ref >59)

## 2020-07-11 NOTE — Progress Notes (Signed)
Recent BMP

## 2020-07-18 ENCOUNTER — Telehealth: Payer: Self-pay

## 2020-07-18 NOTE — Telephone Encounter (Signed)
Patient returning a phone call from [place].

## 2020-07-22 ENCOUNTER — Other Ambulatory Visit: Payer: 59

## 2020-07-22 ENCOUNTER — Other Ambulatory Visit: Payer: Self-pay

## 2020-07-22 DIAGNOSIS — E119 Type 2 diabetes mellitus without complications: Secondary | ICD-10-CM

## 2020-07-24 NOTE — Telephone Encounter (Signed)
Addressed with patient no further action needed.

## 2020-08-12 ENCOUNTER — Ambulatory Visit (INDEPENDENT_AMBULATORY_CARE_PROVIDER_SITE_OTHER): Payer: 59 | Admitting: Internal Medicine

## 2020-08-12 ENCOUNTER — Other Ambulatory Visit: Payer: Self-pay

## 2020-08-12 VITALS — BP 124/72 | HR 62 | Ht 68.0 in | Wt 212.6 lb

## 2020-08-12 DIAGNOSIS — I480 Paroxysmal atrial fibrillation: Secondary | ICD-10-CM | POA: Diagnosis not present

## 2020-08-12 DIAGNOSIS — I483 Typical atrial flutter: Secondary | ICD-10-CM | POA: Diagnosis not present

## 2020-08-12 DIAGNOSIS — I10 Essential (primary) hypertension: Secondary | ICD-10-CM | POA: Diagnosis not present

## 2020-08-12 MED ORDER — PROPRANOLOL HCL 10 MG PO TABS: 10.0000 mg | ORAL_TABLET | Freq: Two times a day (BID) | ORAL | 0 refills | Status: DC

## 2020-08-12 NOTE — Patient Instructions (Addendum)
Medication Instructions:  Reduce Propranolol to 10 mg, two time a day for 2 weeks. Then stop Your physician recommends that you continue on your current medications as directed. Please refer to the Current Medication list given to you today.  Labwork: None ordered.  Testing/Procedures: None ordered.  Follow-Up: Your physician wants you to follow-up in: 12/16/20 at 10:30 am with Hillis Range, MD    Any Other Special Instructions Will Be Listed Below (If Applicable).  If you need a refill on your cardiac medications before your next appointment, please call your pharmacy.

## 2020-08-12 NOTE — Progress Notes (Signed)
PCP: Doreene Nest, NP Primary Cardiologist: Dr Rosemary Holms Primary EP: Dr Johney Frame  Kevin Branch is a 50 y.o. male who presents today for routine electrophysiology followup.  Since last being seen in our clinic, the patient reports doing very well.  Today, he denies symptoms of palpitations, chest pain, shortness of breath,  lower extremity edema, dizziness, presyncope, or syncope.  The patient is otherwise without complaint today.   Past Medical History:  Diagnosis Date   A-fib [facility])    Essential hypertension    Hyperglycemia    Hyperlipidemia    Overweight    Typical atrial flutter [facility])    Past Surgical History:  Procedure Laterality Date   A-FLUTTER ABLATION N/A 03/03/2019   Procedure: A-FLUTTER ABLATION;  Surgeon: Hillis Range, MD;  Location: [facility];  Service: Cardiovascular;  Laterality: N/A;   none      ROS- all systems are reviewed and negatives except as per HPI above  Current Outpatient Medications  Medication Sig Dispense Refill   acetaminophen (TYLENOL) 325 MG tablet Take 650 mg by mouth every 6 (six) hours as needed for mild pain.     ascorbic acid (VITAMIN C) 500 MG tablet Take 1 tablet (500 mg total) by mouth daily.     calcium carbonate (TUMS - DOSED IN MG ELEMENTAL CALCIUM) 500 MG chewable tablet Chew 2 tablets by mouth daily as needed for indigestion or heartburn.     dapagliflozin propanediol (FARXIGA) 10 MG TABS tablet Take 1 tablet (10 mg total) by mouth daily. 30 tablet 2   diltiazem (CARDIZEM CD) 180 MG 24 hr capsule Take 1 capsule (180 mg total) by mouth daily. 60 capsule 6   ELIQUIS 5 MG TABS tablet TAKE 1 TABLET BY MOUTH TWICE A DAY 60 tablet 5   flecainide (TAMBOCOR) 50 MG tablet TAKE 1 TABLET BY MOUTH TWICE A DAY 180 tablet 3   latanoprost (XALATAN) 0.005 % ophthalmic solution SMARTSIG:1 Drop(s) In Eye(s) Every Evening     methimazole (TAPAZOLE) 10 MG tablet Take 2 tablets (20 mg total) by mouth daily. 180 tablet 2   Multiple  Vitamins-Minerals (ONE-A-DAY VITACRAVES ADULT PO) Take 1 tablet by mouth daily.     potassium chloride (KLOR-CON) 10 MEQ tablet TAKE 1 TABLET BY MOUTH EVERY DAY 90 tablet 1   propranolol (INDERAL) 20 MG tablet TAKE 1 TABLET BY MOUTH THREE TIMES A DAY 60 tablet 0   sacubitril-valsartan (ENTRESTO) 24-26 MG Take 1 tablet by mouth 2 (two) times daily. 60 tablet 3   Torsemide 40 MG TABS Take 20 mg by mouth as needed. With additional doses as needed. 60 tablet 3   No current facility-administered medications for this visit.    Physical Exam: Vitals:   08/12/20 1057  BP: 124/72  Pulse: 62  SpO2: 95%  Weight: 212 lb 9.6 oz (96.4 kg)  Height: 5\' 8"  (1.727 m)    GEN- The patient is well appearing, alert and oriented x 3 today.   Head- normocephalic, atraumatic Eyes-  Sclera clear, conjunctiva pink Ears- hearing intact Oropharynx- clear Lungs- Clear to ausculation bilaterally, normal work of breathing Heart- Regular rate and rhythm, no murmurs, rubs or gallops, PMI not laterally displaced GI- soft, NT, ND, + BS Extremities- no clubbing, cyanosis, or edema  Wt Readings from Last 3 Encounters:  08/12/20 212 lb 9.6 oz (96.4 kg)  07/10/20 203 lb (92.1 kg)  07/04/20 206 lb (93.4 kg)    EKG tracing ordered today is personally reviewed and  shows sinus rhythm  Echo 6/22 reviewed Notes from [facility] reviewed from 07/10/20  Assessment and Plan:  Paroxysmal atrial fibrillation/ atrial flutter S/p prior CTI ablation He had AFib in the setting of thyrotoxicosis.  This improved with tapazole treatment, without recurrence despite  severe LA enlargement.  Echo 07/03/20 shows moderate LA enlargement. Reduce propranolol to 10mg  BID x 2 weeks then discontinue We could consider weaning flecainide on return  2. HTN Continue entresto 24/26 BID Bmet 04/08/20 reviewed  3. Hyperthyroidism Continue tapazole 20mg  daily  He reports that he has seen endocrine recently.  TFTs 07/04/20 are reviewed.   Continues to requite close endocrine follow-up.  4. Chronic diastolic dysfunction Clinical euvolemic He is taking torsemide prn currently  Risks, benefits and potential toxicities for medications prescribed and/or refilled reviewed with patient today.   Follow-up with Dr office as scheduled I will see again in 4 months  07/06/20 MD, [facility] 08/12/2020 11:10 AM

## 2020-08-21 ENCOUNTER — Other Ambulatory Visit: Payer: Self-pay | Admitting: Internal Medicine

## 2020-08-21 ENCOUNTER — Other Ambulatory Visit: Payer: Self-pay

## 2020-08-21 MED ORDER — METHIMAZOLE 10 MG PO TABS: 20.0000 mg | ORAL_TABLET | Freq: Every day | ORAL | 2 refills | Status: DC

## 2020-08-30 ENCOUNTER — Other Ambulatory Visit: Payer: Self-pay | Admitting: Student

## 2020-09-02 ENCOUNTER — Other Ambulatory Visit (INDEPENDENT_AMBULATORY_CARE_PROVIDER_SITE_OTHER): Payer: 59

## 2020-09-02 ENCOUNTER — Other Ambulatory Visit: Payer: Self-pay

## 2020-09-02 DIAGNOSIS — E0590 Thyrotoxicosis, unspecified without thyrotoxic crisis or storm: Secondary | ICD-10-CM | POA: Diagnosis not present

## 2020-09-02 LAB — T4, FREE: Free T4: 0.91 ng/dL (ref 0.60–1.60)

## 2020-09-02 LAB — TSH: TSH: <0.01 u[IU]/mL (ref 0.35–5.50)

## 2020-10-06 ENCOUNTER — Other Ambulatory Visit: Payer: Self-pay | Admitting: Internal Medicine

## 2020-10-07 NOTE — Telephone Encounter (Signed)
Eliquis 5 mg refill request received. Patient is 50 years old, weight-96.4 kg, Crea- 0.86 on 07/10/20, Diagnosis-afib, and last seen by Dr. Johney Frame on 08/12/20. Dose is appropriate based on dosing criteria. Will send in refill to requested pharmacy.

## 2020-10-08 ENCOUNTER — Encounter: Payer: 59 | Admitting: Primary Care

## 2020-10-08 NOTE — Progress Notes (Signed)
Patient referred by [name], NP for atrial flutter  Subjective:   Kevin Branch, male    DOB: 09/08/1970, 50 y.o.   MRN: [id number]   Chief Complaint  Patient presents with   Atrial Fibrillation   Hypertension   Follow-up    3 months   50 y.o. Caucasian male with hypertension, presented to [name] on 02/10/2019 with typical atrial flutter with RVR.  Given onset of symptoms of only 2-3 days, and event successful direct-current cardioversion with conversion to normal rhythm.   Patient presents for 66-monthfollow-up of HFpEF, atrial fibrillation/flutter, and hypertension.  At last office visit switch torsemide to as needed dosing.  Since last office visit patient has been seen by Dr. ARayann Hemanwho weaned patient off propranolol given that he has had no recurrence of atrial fibrillation since treatment of thyrotoxicosis.  Notably Dr. ARayann Hemandiscussed potentially weaning flecainide at next office visit with EP which is scheduled for December 2022.   Patient is presently feeling well without new recurrence of atrial fibrillation/flutter.  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 Denies chest pain, dyspnea, syncope, near syncope, dizziness, orthopnea, PND.  He continues to follow closely with endocrine for management of thyroid dysfunction.  Current Outpatient Medications on File Prior to Visit  Medication Sig Dispense Refill   acetaminophen (TYLENOL) 325 MG tablet Take 650 mg by mouth every 6 (six) hours as needed for mild pain.     ascorbic acid (VITAMIN C) 500 MG tablet Take 1 tablet (500 mg total) by mouth daily.     calcium carbonate (TUMS - DOSED IN MG ELEMENTAL CALCIUM) 500 MG chewable tablet Chew 2 tablets by mouth daily as needed for indigestion or heartburn.     dapagliflozin propanediol (FARXIGA) 10 MG TABS tablet Take 1 tablet (10 mg total) by mouth daily. 30 tablet 2   diltiazem (CARDIZEM CD) 180 MG 24 hr capsule Take 1 capsule (180 mg total) by mouth  daily. 60 capsule 6   ELIQUIS 5 MG TABS tablet TAKE 1 TABLET BY MOUTH TWICE A DAY 60 tablet 5   ENTRESTO 24-26 MG TAKE 1 TABLET BY MOUTH TWICE A DAY 60 tablet 3   flecainide (TAMBOCOR) 50 MG tablet TAKE 1 TABLET BY MOUTH TWICE A DAY 180 tablet 3   latanoprost (XALATAN) 0.005 % ophthalmic solution SMARTSIG:1 Drop(s) In Eye(s) Every Evening     methimazole (TAPAZOLE) 10 MG tablet Take 2 tablets (20 mg total) by mouth daily. 180 tablet 2   Multiple Vitamins-Minerals (ONE-A-DAY VITACRAVES ADULT PO) Take 1 tablet by mouth daily.     potassium chloride (KLOR-CON) 10 MEQ tablet TAKE 1 TABLET BY MOUTH EVERY DAY 90 tablet 1   Torsemide 40 MG TABS Take 20 mg by mouth as needed. With additional doses as needed. (Patient not taking: Reported on 10/14/2020) 60 tablet 3   No current facility-administered medications on file prior to visit.    Cardiovascular and other pertinent studies: ABI 07/06/2020:  This exam reveals normal perfusion of the right lower extremity (ABI 1.00) and normal perfusion of the left lower extremity (ABI 1.00).  Mild spectral broadening suggests decreased compliance of the vessel.  Echocardiogram 07/03/2020:  Normal LV systolic function with visual EF 50-55%. Left ventricle cavity  is normal in size. Mild left ventricular hypertrophy. Normal global wall  motion. Indeterminate diastolic filling pattern, normal LAP.  Left atrial cavity is moderately dilated.  Right atrial cavity is slightly dilated, visually.  Mild (Grade I)  aortic regurgitation.  Mild (Grade I) mitral regurgitation.  Mild tricuspid regurgitation. No evidence of pulmonary hypertension.  Mild pulmonic regurgitation.  Prior study dated 04/07/2020: LVEF 65-70%, mild/moderate MR and TR,  moderate AR.  EKG 04/22/2020:  Sinus bradycardia at a rate of 57 bpm.  Left atrial abnormality.  Normal axis.  Incomplete right bundle branch block.  Nonspecific ST-T changes.  Echocardiogram 04/07/2020: 1. Left ventricular  ejection fraction, by estimation, is 65 to 70%. The  left ventricle has normal function. The left ventricle has no regional  wall motion abnormalities. Left ventricular diastolic parameters are  consistent with Grade I diastolic  dysfunction (impaired relaxation).   2. Right ventricular systolic function is normal. There is moderately  elevated pulmonary artery systolic pressure. Estimated PASP 55 mmHg.   3. The mitral valve is grossly normal. Mild to moderate mitral valve  regurgitation.   4. Tricuspid valve regurgitation is mild to moderate.   5. The aortic valve is grossly normal. Aortic valve regurgitation is  moderate.   6. Unlike previous study on 0/08/6759, diastolic function can now be  assessed. Estimated PASP marginally increased from 47 mmHg to 55 mmHg.  ED EKG 03/25/2020: Coarse Afib w/RVR Nonspecific ST-T changes  EKG 03/25/2020:  Sinus rhythm at a rate of 86 bpm.  Left atrial abnormality.  Normal axis.  Incomplete right bundle branch block. Non-specific ST-T changes, cannot exclude anteroseptal ischemia.   A flutter ablation: 03/03/2019  Cardioversion: 02/10/2019  EKG 02/21/2018: Typical atrial flutter 102 bpm Variable conduction  EKG 02/10/2019: Typical atrial flutter 155 bpm   Recent labs: CMP Latest Ref Rng & Units 07/10/2020 04/09/2020 04/08/2020  Glucose 65 - 99 mg/dL 202(H) 94 132(H)  BUN 6 - 24 mg/dL _0 Creatinine 0.76 - 1.27 mg/dL 0.86 0.50(L) 0.61  Sodium 134 - 144 mmol/L 135 136 133(L)  Potassium 3.5 - 5.2 mmol/L 4.6 3.6 3.4(L)  Chloride 96 - 106 mmol/L 92(L) 99 99  CO2 20 - 29 mmol/L _1 Calcium 8.7 - 10.2 mg/dL 9.6 8.0(L) 8.0(L)  Total Protein 6.5 - 8.1 g/dL - 7.1 7.1  Total Bilirubin 0.3 - 1.2 mg/dL - 1.4(H) 1.2  Alkaline Phos 38 - 126 U/L - 107 115  AST 15 - 41 U/L - 44(H) 46(H)  ALT 0 - 44 U/L - 25 26   CBC Latest Ref Rng & Units 04/09/2020 04/08/2020 04/07/2020  WBC 4.0 - 10.5 K/uL 9.7 12.1(H) 9.8  Hemoglobin 13.0 - 17.0 g/dL 13.2  12.8(L) 11.8(L)  Hematocrit 39.0 - 52.0 % 38.3(L) 38.1(L) 33.9(L)  Platelets 150 - 400 K/uL 276 235 203   Lipid Panel     Component Value Date/Time   CHOL 151 07/03/2016 0933   TRIG 70.0 07/03/2016 0933   HDL 45.20 07/03/2016 0933   CHOLHDL 3 07/03/2016 0933   VLDL 14.0 07/03/2016 0933   LDLCALC 92 07/03/2016 0933   HEMOGLOBIN A1C Lab Results  Component Value Date   HGBA1C 5.5 03/26/2020   MPG 111.15 03/26/2020   TSH Recent Labs    05/13/20 1024 07/04/20 1018 09/02/20 1022  TSH <0.01* <0.01* <0.01    04/09/2020: Glucose 94.  BUN/creatinine 10/0.5.  NA/K1 36/3.6.  eGFR >60.  AST 44.  ALT 25.  Albumin 2.5. Hemoglobin/hematocrit 13.2/38.3.  MCV 93.2.  Platelet 276  03/18/2020: Sodium 136, potassium 3.4, BUN 17 creatinine 0.4, AST 54, ALT 34, alk phos 117, GFR >60  03/05/2020:  Hemoglobin 13.5, hematocrit 40.7, MCV 94.9, platelets 204 Sodium 136, potassium  4.1, glucose 143, BUN 14, creatinine 0.62, GFR >60  02/10/2019: Glucose 248, BUN/Cr 12/0.47. EGFR >60. Na/K 137/3.6.  Calcium: 8.6 H/H 13.7/39.6. MCV 91.5. Platelets 171 D-Dimer 0.43  06/2016: Chol 151, TG 70, HDL 45, LDL 92   Review of Systems  Constitutional: Negative for malaise/fatigue.  Cardiovascular:  Negative for chest pain, claudication, dyspnea on exertion, leg swelling, near-syncope, orthopnea, palpitations, paroxysmal nocturnal dyspnea and syncope.  Respiratory:  Negative for shortness of breath.   Neurological:  Negative for dizziness.        Vitals:   10/14/20 0951  BP: 121/71  Pulse: 73  Resp: 16  Temp: 98.2 F (36.8 C)  SpO2: 98%     Body mass index is 34.67 kg/m. Filed Weights   10/14/20 0951  Weight: 228 lb (103.4 kg)     Objective:   Physical Exam Vitals reviewed.  Constitutional:      General: He is not in acute distress.    Appearance: He is well-developed.  Neck:     Vascular: No JVD.  Cardiovascular:     Rate and Rhythm: Normal rate and regular rhythm.     Pulses:  Intact distal pulses.          Carotid pulses are 2+ on the right side and 2+ on the left side.      Femoral pulses are 1+ on the right side and 1+ on the left side.      Popliteal pulses are 1+ on the right side and 1+ on the left side.       Dorsalis pedis pulses are 0 on the right side and 0 on the left side.       Posterior tibial pulses are 1+ on the right side and 1+ on the left side.     Heart sounds: Normal heart sounds, S1 normal and S2 normal. No murmur heard. Pulmonary:     Effort: Pulmonary effort is normal.     Breath sounds: Normal breath sounds. No wheezing or rales.  Musculoskeletal:     Right lower leg: No edema.     Left lower leg: No edema.  Skin:    General: Skin is warm and dry.       Assessment & Recommendations:   50 y.o. Caucasian male with hypertension, paroxysmal atrial flutter, history of tobacco use, diabetes mellitus, hyperlipidemia.  Also has history of atrial fibrillation/flutter, status post atrial flutter ablation.  Patient was hospitalized 04/04/2020-04/09/2020 for acute on chronic diastolic heart failure given by hypothyroidism, the time patient was also found to be in atrial fibrillation/flutter.  During hospitalization echocardiogram revealed left atrium severely dilated, suspicious for WHO group 2 PAH versus pulmonary arterial hypertension.  Patient was diuresed and discharged home. Repeat echocardiogram improved and therefore held off on right heart catheterization.   Pulmonary hypertension, HFpEF:  Previous echocardiogram 03/26/2020 revealed severe left atrium dilation and therefore right heart catheterization was recommended to evaluate for underlying pulmonary hypertension.  It was suspected patient may have a group 2 in view of mitral regurgitation diastolic dysfunction.  However repeat echocardiogram revealed no evidence of pulmonary hypertension, although left atrium is still moderately dilated. Compared to previous echocardiogram moderate AR, TR, MR  have all improved to mild and LVEF is 50-55%.  Therefore continued to hold off on right heart catheterization at this time.  Patient remains relatively asymptomatic and there is no clinical evidence of heart failure.  We will obtain lipid profile testing, CBC, and baseline pro BNP.  Patient has not  needed torsemide since last office visit.  Paroxysmal atrial flutter: S/p cardioversion 02/10/19.  Status post atrial flutter ablation 03/03/2019. This patients CHA2DS2-VASc Score 2 (HTN, DM) and yearly risk of stroke 2.2%.  Episode of atrial fibrillation in March 2022 likely driven by hyperthyroidism which led to high-output heart failure.  Patient now follows with endocrinology for management of thyroid dysfunction, presently on methimazole. He follows closely with Dr. [name] who recently reduced patient's propranolol and per review of his records we will consider weaning patient off flecainide as well at next office visit. Will dfer further management to Dr. [name].  Patient is tolerating anticoagulation without bleeding diathesis.  Hypertension:  Well controlled.   Continue diltiazem, Entresto, as needed torsemide.  Follow-up in 1 year sooner if needed, for HFpEF, atrial fibrillation/flutter, hypertension.   Alethia Berthold, PA-C 10/14/2020, 11:15 AM Office: [phone]

## 2020-10-14 ENCOUNTER — Ambulatory Visit: Payer: 59 | Admitting: Student

## 2020-10-14 ENCOUNTER — Encounter: Payer: Self-pay | Admitting: Student

## 2020-10-14 ENCOUNTER — Other Ambulatory Visit: Payer: Self-pay

## 2020-10-14 VITALS — BP 121/71 | HR 73 | Temp 98.2°F | Resp 16 | Ht 68.0 in | Wt 228.0 lb

## 2020-10-14 DIAGNOSIS — I480 Paroxysmal atrial fibrillation: Secondary | ICD-10-CM

## 2020-10-14 DIAGNOSIS — I10 Essential (primary) hypertension: Secondary | ICD-10-CM

## 2020-10-14 DIAGNOSIS — I5032 Chronic diastolic (congestive) heart failure: Secondary | ICD-10-CM

## 2020-10-15 LAB — LIPID PANEL WITH LDL/HDL RATIO
Cholesterol, Total: 236 mg/dL — ABNORMAL HIGH (ref 100–199)
HDL: 51 mg/dL (ref >39)
LDL Chol Calc (NIH): 169 mg/dL — ABNORMAL HIGH (ref 0–99)
LDL/HDL Ratio: 3.3 ratio (ref 0.0–3.6)
Triglycerides: 93 mg/dL (ref 0–149)
VLDL Cholesterol Cal: 16 mg/dL (ref 5–40)

## 2020-10-15 LAB — CBC
Hematocrit: 47.4 % (ref 37.5–51.0)
Hemoglobin: 17 g/dL (ref 13.0–17.7)
MCH: 33.4 pg — ABNORMAL HIGH (ref 26.6–33.0)
MCHC: 35.9 g/dL — ABNORMAL HIGH (ref 31.5–35.7)
MCV: 93 fL (ref 79–97)
Platelets: 237 10*3/uL (ref 150–450)
RBC: 5.09 x10E6/uL (ref 4.14–5.80)
RDW: 11.9 % (ref 11.6–15.4)
WBC: 12.5 10*3/uL — ABNORMAL HIGH (ref 3.4–10.8)

## 2020-10-15 LAB — PRO B NATRIURETIC PEPTIDE: NT-Pro BNP: 31 pg/mL (ref 0–121)

## 2020-10-18 NOTE — Progress Notes (Signed)
Patient aware, I will fax pcp blood work and he can be reached at [phone], I told him you would call him later

## 2020-10-21 NOTE — Progress Notes (Signed)
Will you please follow up with patient regarding the above.

## 2020-11-04 ENCOUNTER — Encounter: Payer: Self-pay | Admitting: Internal Medicine

## 2020-11-04 ENCOUNTER — Ambulatory Visit (INDEPENDENT_AMBULATORY_CARE_PROVIDER_SITE_OTHER): Payer: 59 | Admitting: Internal Medicine

## 2020-11-04 ENCOUNTER — Other Ambulatory Visit: Payer: Self-pay

## 2020-11-04 VITALS — BP 130/82 | HR 70 | Ht 68.0 in | Wt 231.0 lb

## 2020-11-04 DIAGNOSIS — E0500 Thyrotoxicosis with diffuse goiter without thyrotoxic crisis or storm: Secondary | ICD-10-CM

## 2020-11-04 DIAGNOSIS — E0590 Thyrotoxicosis, unspecified without thyrotoxic crisis or storm: Secondary | ICD-10-CM | POA: Diagnosis not present

## 2020-11-04 LAB — T4, FREE: Free T4: 0.29 ng/dL — ABNORMAL LOW (ref 0.60–1.60)

## 2020-11-04 LAB — TSH: TSH: 6.26 u[IU]/mL — ABNORMAL HIGH (ref 0.35–5.50)

## 2020-11-04 MED ORDER — METHIMAZOLE 10 MG PO TABS: 10.0000 mg | ORAL_TABLET | Freq: Every day | ORAL | 2 refills | Status: DC

## 2020-11-04 NOTE — Progress Notes (Signed)
Name: Kevin Branch  MRN/ DOB: [id number], 1970/02/25    Age/ Sex: 50 y.o., male     PCP: Doreene Nest, NP   Reason for Endocrinology Evaluation: Hyperthyroidism     Initial Endocrinology Clinic Visit: 04/03/2020    PATIENT IDENTIFIER: [name] is a 50 y.o., male with a past medical history of A. Fib ( S/P Ablation ) and hyperthyroidism. He has followed with [facility] since 04/03/2020 for consultative assistance with management of his Hyperthyroidism.   HISTORICAL SUMMARY:  He has diagnosed with hyperthyroidism during his admission for recurrent A. Fib in 03/2020 with a suppressed TSH 0.000 uIU/mL and elevated FT4 at > 5.5 ng/dL . He was started on B-blocker and Methimazole    Thyroid ultrasound was unrevealing 04/2020  TRAB elevated at 34.33 IU/L   Follows with Cardiology - Dr. Rosemary Holms No prior amiodarone use   Mother with thyroid disease  SUBJECTIVE:    Today (11/04/2020):  Kevin Branch is here for hyperthyroidism.   He has been noted with weight gain  Denies constipation or diarrhea  Denies abdominal pain, or fever  No eye burning or itching, last appointment 2 months ago   Methimazole 10 mg , 2 tabs daily     HISTORY:  Past Medical History:  Past Medical History:  Diagnosis Date   A-fib (HCC)    Essential hypertension    Hyperglycemia    Hyperlipidemia    Overweight    Typical atrial flutter (HCC)    Past Surgical History:  Past Surgical History:  Procedure Laterality Date   A-FLUTTER ABLATION N/A 03/03/2019   Procedure: A-FLUTTER ABLATION;  Surgeon: Hillis Range, MD;  Location: [facility];  Service: Cardiovascular;  Laterality: N/A;   none     Social History:  reports that he has been smoking cigars. He has quit using smokeless tobacco.  His smokeless tobacco use included snuff. He reports that he does not currently use alcohol. He reports that he does not use drugs. Family History:  Family History  Problem  Relation Age of Onset   Hyperlipidemia Mother    Heart disease Mother    Diabetes Mother    Hyperlipidemia Father    Hypertension Sister    Hyperlipidemia Sister      HOME MEDICATIONS: Allergies as of 11/04/2020   No Known Allergies      Medication List        Accurate as of November 04, 2020  1:56 PM. If you have any questions, ask your nurse or doctor.          acetaminophen 325 MG tablet Commonly known as: TYLENOL Take 650 mg by mouth every 6 (six) hours as needed for mild pain.   ascorbic acid 500 MG tablet Commonly known as: VITAMIN C Take 1 tablet (500 mg total) by mouth daily.   calcium carbonate 500 MG chewable tablet Commonly known as: TUMS - dosed in mg elemental calcium Chew 2 tablets by mouth daily as needed for indigestion or heartburn.   dapagliflozin propanediol 10 MG Tabs tablet Commonly known as: Farxiga Take 1 tablet (10 mg total) by mouth daily.   diltiazem 180 MG 24 hr capsule Commonly known as: CARDIZEM CD Take 1 capsule (180 mg total) by mouth daily.   Eliquis 5 MG Tabs tablet Generic drug: apixaban TAKE 1 TABLET BY MOUTH TWICE A DAY   Entresto 24-26 MG Generic drug: sacubitril-valsartan TAKE 1 TABLET BY MOUTH TWICE A DAY   flecainide  50 MG tablet Commonly known as: TAMBOCOR TAKE 1 TABLET BY MOUTH TWICE A DAY   latanoprost 0.005 % ophthalmic solution Commonly known as: XALATAN SMARTSIG:1 Drop(s) In Eye(s) Every Evening   methimazole 10 MG tablet Commonly known as: TAPAZOLE Take 2 tablets (20 mg total) by mouth daily.   ONE-A-DAY VITACRAVES ADULT PO Take 1 tablet by mouth daily.   potassium chloride 10 MEQ tablet Commonly known as: KLOR-CON TAKE 1 TABLET BY MOUTH EVERY DAY   Torsemide 40 MG Tabs Take 20 mg by mouth as needed. With additional doses as needed.          OBJECTIVE:   PHYSICAL EXAM: VS: BP 130/82 (BP Location: Left Arm, Patient Position: Sitting, Cuff Size: Small)   Pulse 70   Ht 5\' 8"  (1.727 m)    Wt 231 lb (104.8 kg)   SpO2 96%   BMI 35.12 kg/m    EXAM: General: Pt appears well and is in NAD  Eyes: External eye exam normal with a stare,but no  lid lag or exophthalmos.    Neck: General: Supple without adenopathy. Thyroid: Thyroid size normal.  No goiter or nodules appreciated. No thyroid bruit.  Lungs: Clear with good BS bilat with no rales, rhonchi, or wheezes  Heart: Auscultation: RRR.  Abdomen: Normoactive bowel sounds, soft, nontender, without masses or organomegaly palpable  Extremities:  BL LE: No pretibial edema normal ROM and strength.  Mental Status: Judgment, insight: Intact Orientation: Oriented to time, place, and person Mood and affect: No depression, anxiety, or agitation     DATA REVIEWED: Results for DUGLAS, HEIER" (MRN Penelope Coop) as of 11/04/2020 13:56  Ref. Range 11/04/2020 10:06  TSH Latest Ref Range: 0.35 - 5.50 uIU/mL 6.26 (H)  T4,Free(Direct) Latest Ref Range: 0.60 - 1.60 ng/dL 11/06/2020 (L)    Results for AADIL, SUR" (MRN Penelope Coop) as of 07/04/2020 07:29  Ref. Range 04/03/2020 11:55  TRAB Latest Ref Range: <=2.00 IU/L 34.33 (H)     Thyroid Ultrasound 04/24/2020  Moderately heterogeneous and enlarged thyroid gland without evidence of focal nodules.  ASSESSMENT / PLAN / RECOMMENDATIONS:   Hyperthyroidism Secondary to Graves' Disease :   - Pt is clinically euthyroid  - No local neck symptoms  - TFT's today show hypothyroid, we will reduce the dose as below  Medications  Decrease methimazole 10 mg, to 1 tablet daily     2. Graves' Disease:   - No extrathyroidal manifestations of Graves' Disease, pt advised to let his ophthalmologist know about Graves' disease   F/U in 4 months  Labs in 8 weeks     Signed electronically by: 04/26/2020, MD  [facility]  [facility] [address] [place] [address] [place], [place] Phone: [phone] FAX: [phone]       CC: [phone], NP [address] [place] Phone: [phone]  Fax: [phone]   Return to Endocrinology clinic as below: Future Appointments  Date Time Provider Department Center  12/16/2020 10:30 AM 14/05/2020, MD [facility]  12/30/2020 10:15 AM [facility] None  03/10/2021  9:30 AM Savian Mazon, 03/12/2021, MD [facility] None  10/13/2021 10:00 AM [place], 12/13/2021, PA-C [facility] None

## 2020-12-06 ENCOUNTER — Other Ambulatory Visit (HOSPITAL_COMMUNITY): Payer: Self-pay | Admitting: Student

## 2020-12-16 ENCOUNTER — Ambulatory Visit (INDEPENDENT_AMBULATORY_CARE_PROVIDER_SITE_OTHER): Payer: 59 | Admitting: Internal Medicine

## 2020-12-16 ENCOUNTER — Other Ambulatory Visit: Payer: Self-pay

## 2020-12-16 VITALS — BP 132/76 | HR 76 | Ht 68.0 in | Wt 232.4 lb

## 2020-12-16 DIAGNOSIS — I480 Paroxysmal atrial fibrillation: Secondary | ICD-10-CM

## 2020-12-16 DIAGNOSIS — I10 Essential (primary) hypertension: Secondary | ICD-10-CM | POA: Diagnosis not present

## 2020-12-16 DIAGNOSIS — I483 Typical atrial flutter: Secondary | ICD-10-CM

## 2020-12-16 NOTE — Progress Notes (Signed)
PCP: Doreene Nest, NP Primary Cardiologist: Dr Rosemary Holms Primary EP: Dr Johney Frame  Kevin Branch is a 50 y.o. male who presents today for routine electrophysiology followup.  Since last being seen in our clinic, the patient reports doing very well.  Today, he denies symptoms of palpitations, chest pain, shortness of breath,  lower extremity edema, dizziness, presyncope, or syncope.  The patient is otherwise without complaint today.   Past Medical History:  Diagnosis Date   A-fib [facility])    Essential hypertension    Hyperglycemia    Hyperlipidemia    Overweight    Typical atrial flutter [facility])    Past Surgical History:  Procedure Laterality Date   A-FLUTTER ABLATION N/A 03/03/2019   Procedure: A-FLUTTER ABLATION;  Surgeon: Hillis Range, MD;  Location: [facility];  Service: Cardiovascular;  Laterality: N/A;   none      ROS- all systems are reviewed and negatives except as per HPI above  Current Outpatient Medications  Medication Sig Dispense Refill   acetaminophen (TYLENOL) 325 MG tablet Take 650 mg by mouth every 6 (six) hours as needed for mild pain.     ascorbic acid (VITAMIN C) 500 MG tablet Take 1 tablet (500 mg total) by mouth daily.     calcium carbonate (TUMS - DOSED IN MG ELEMENTAL CALCIUM) 500 MG chewable tablet Chew 2 tablets by mouth daily as needed for indigestion or heartburn.     dapagliflozin propanediol (FARXIGA) 10 MG TABS tablet Take 1 tablet (10 mg total) by mouth daily. 30 tablet 2   diltiazem (CARDIZEM CD) 180 MG 24 hr capsule Take 1 capsule (180 mg total) by mouth daily. 60 capsule 6   ELIQUIS 5 MG TABS tablet TAKE 1 TABLET BY MOUTH TWICE A DAY 60 tablet 5   ENTRESTO 24-26 MG TAKE 1 TABLET BY MOUTH TWICE A DAY 60 tablet 3   flecainide (TAMBOCOR) 50 MG tablet TAKE 1 TABLET BY MOUTH TWICE A DAY 180 tablet 3   latanoprost (XALATAN) 0.005 % ophthalmic solution SMARTSIG:1 Drop(s) In Eye(s) Every Evening     methimazole (TAPAZOLE) 10 MG tablet Take 1  tablet (10 mg total) by mouth daily. 90 tablet 2   Multiple Vitamins-Minerals (ONE-A-DAY VITACRAVES ADULT PO) Take 1 tablet by mouth daily.     potassium chloride (KLOR-CON) 10 MEQ tablet TAKE 1 TABLET BY MOUTH EVERY DAY 90 tablet 1   Torsemide 40 MG TABS Take 20 mg by mouth as needed. With additional doses as needed. 60 tablet 3   No current facility-administered medications for this visit.    Physical Exam: Vitals:   12/16/20 1057  BP: 132/76  Pulse: 76  SpO2: 94%  Weight: 232 lb 6.4 oz (105.4 kg)  Height: 5\' 8"  (1.727 m)    GEN- The patient is well appearing, alert and oriented x 3 today.   Head- normocephalic, atraumatic Eyes-  Sclera clear, conjunctiva pink Ears- hearing intact Oropharynx- clear Lungs- Clear to ausculation bilaterally, normal work of breathing Heart- Regular rate and rhythm, no murmurs, rubs or gallops, PMI not laterally displaced GI- soft, NT, ND, + BS Extremities- no clubbing, cyanosis, or edema  Wt Readings from Last 3 Encounters:  12/16/20 232 lb 6.4 oz (105.4 kg)  11/04/20 231 lb (104.8 kg)  10/14/20 228 lb (103.4 kg)    EKG tracing ordered today is personally reviewed and shows sinus rhythm  Assessment and Plan:  Paroxysmal atrial fibrillation/ atrial flutter S/p CTI ablation He had afib previously in  the setting of thyrotoxicosis.  AF has resolved with tapazole treatment of hyperthyroidism though he does have severe LA enlargement stop flecainide today.  We can use this prn going forward if needed. Consider weaning diltiazem CD to 120mg  daily on return to Dr clinic  2. HTN Stable No change required today  3. Hyperthyroidism As above  4. Chronic diastolic dysfunction Stable No change required today  Follow-up with Dr Lynwood Dawley going forward EP to see as needed  Rosemary Holms MD, [facility]  12/16/2020 11:02 AM

## 2020-12-16 NOTE — Patient Instructions (Addendum)
Medication Instructions:  Stop Flecainide Your physician recommends that you continue on your current medications as directed. Please refer to the Current Medication list given to you today. *If you need a refill on your cardiac medications before your next appointment, please call your pharmacy*  Lab Work: None. If you have labs (blood work) drawn today and your tests are completely normal, you will receive your results only by: MyChart Message (if you have MyChart) OR A paper copy in the mail If you have any lab test that is abnormal or we need to change your treatment, we will call you to review the results.  Testing/Procedures: None.  Follow-Up: At [facility], you and your health needs are our priority.  As part of our continuing mission to provide you with exceptional heart care, we have created designated Provider Care Teams.  These Care Teams include your primary Cardiologist (physician) and Advanced Practice Providers (APPs -  Physician Assistants and Nurse Practitioners) who all work together to provide you with the care you need, when you need it.  Your physician wants you to follow-up in: As needed with  Hillis Range, MD   We recommend signing up for the patient portal called "MyChart".  Sign up information is provided on this After Visit Summary.  MyChart is used to connect with patients for Virtual Visits (Telemedicine).  Patients are able to view lab/test results, encounter notes, upcoming appointments, etc.  Non-urgent messages can be sent to your provider as well.   To learn more about what you can do with MyChart, go to [email or web address].    Any Other Special Instructions Will Be Listed Below (If Applicable).

## 2020-12-30 ENCOUNTER — Other Ambulatory Visit: Payer: Self-pay

## 2020-12-30 ENCOUNTER — Other Ambulatory Visit (INDEPENDENT_AMBULATORY_CARE_PROVIDER_SITE_OTHER): Payer: 59

## 2020-12-30 DIAGNOSIS — E0590 Thyrotoxicosis, unspecified without thyrotoxic crisis or storm: Secondary | ICD-10-CM | POA: Diagnosis not present

## 2020-12-30 LAB — TSH: TSH: 0.75 u[IU]/mL (ref 0.35–5.50)

## 2020-12-30 LAB — T4, FREE: Free T4: 0.89 ng/dL (ref 0.60–1.60)

## 2021-01-13 ENCOUNTER — Other Ambulatory Visit: Payer: Self-pay | Admitting: Student

## 2021-03-10 ENCOUNTER — Other Ambulatory Visit: Payer: Self-pay

## 2021-03-10 ENCOUNTER — Ambulatory Visit (INDEPENDENT_AMBULATORY_CARE_PROVIDER_SITE_OTHER): Payer: Self-pay | Admitting: Internal Medicine

## 2021-03-10 ENCOUNTER — Encounter: Payer: Self-pay | Admitting: Internal Medicine

## 2021-03-10 VITALS — BP 120/76 | HR 76 | Ht 68.0 in | Wt 226.0 lb

## 2021-03-10 DIAGNOSIS — E0590 Thyrotoxicosis, unspecified without thyrotoxic crisis or storm: Secondary | ICD-10-CM

## 2021-03-10 DIAGNOSIS — E0500 Thyrotoxicosis with diffuse goiter without thyrotoxic crisis or storm: Secondary | ICD-10-CM

## 2021-03-10 NOTE — Progress Notes (Signed)
Name: Kevin Branch  MRN/ DOB: [id number], [birth year]    Age/ Sex: 51 y.o., male     PCP: Doreene Nest, NP   Reason for Endocrinology Evaluation: Hyperthyroidism     Initial Endocrinology Clinic Visit: 04/03/2020    PATIENT IDENTIFIER: [name] is a 51 y.o., male with a past medical history of A. Fib ( S/P Ablation ) and hyperthyroidism. He has followed with [facility] since 04/03/2020 for consultative assistance with management of his Hyperthyroidism.   HISTORICAL SUMMARY:  He has diagnosed with hyperthyroidism during his admission for recurrent A. Fib in 03/2020 with a suppressed TSH 0.000 uIU/mL and elevated FT4 at > 5.5 ng/dL . He was started on B-blocker and Methimazole    Thyroid ultrasound was unrevealing 04/2020  TRAB elevated at 34.33 IU/L   Follows with Cardiology - Dr. Rosemary Holms No prior amiodarone use   Mother with thyroid disease  SUBJECTIVE:    Today (03/11/2021):  Kevin Branch is here for hyperthyroidism.   He has been noted weight loss  Denies palpitations  Denies constipation or diarrhea  No eye burning or itching Denies local neck symptoms   Methimazole 10 mg , 1 tab daily     HISTORY:  Past Medical History:  Past Medical History:  Diagnosis Date   A-fib (HCC)    Essential hypertension    Hyperglycemia    Hyperlipidemia    Overweight    Typical atrial flutter (HCC)    Past Surgical History:  Past Surgical History:  Procedure Laterality Date   A-FLUTTER ABLATION N/A 03/03/2019   Procedure: A-FLUTTER ABLATION;  Surgeon: Hillis Range, MD;  [facility];  Service: Cardiovascular;  Laterality: N/A;   none     Social History:  reports that he has been smoking cigars. He has quit using smokeless tobacco.  His smokeless tobacco use included snuff. He reports that he does not currently use alcohol. He reports that he does not use drugs. Family History:  Family History  Problem Relation Age of Onset    Hyperlipidemia Mother    Heart disease Mother    Diabetes Mother    Hyperlipidemia Father    Hypertension Sister    Hyperlipidemia Sister      HOME MEDICATIONS: Allergies as of 03/10/2021   No Known Allergies      Medication List        Accurate as of March 10, 2021 11:59 PM. If you have any questions, ask your nurse or doctor.          acetaminophen 325 MG tablet Commonly known as: TYLENOL Take 650 mg by mouth every 6 (six) hours as needed for mild pain.   ascorbic acid 500 MG tablet Commonly known as: VITAMIN C Take 1 tablet (500 mg total) by mouth daily.   calcium carbonate 500 MG chewable tablet Commonly known as: TUMS - dosed in mg elemental calcium Chew 2 tablets by mouth daily as needed for indigestion or heartburn.   dapagliflozin propanediol 10 MG Tabs tablet Commonly known as: Farxiga Take 1 tablet (10 mg total) by mouth daily.   diltiazem 180 MG 24 hr capsule Commonly known as: CARDIZEM CD Take 1 capsule (180 mg total) by mouth daily.   Eliquis 5 MG Tabs tablet Generic drug: apixaban TAKE 1 TABLET BY MOUTH TWICE A DAY   Entresto 24-26 MG Generic drug: sacubitril-valsartan TAKE 1 TABLET BY MOUTH TWICE A DAY   latanoprost 0.005 % ophthalmic solution Commonly known  as: XALATAN SMARTSIG:1 Drop(s) In Eye(s) Every Evening   methimazole 10 MG tablet Commonly known as: TAPAZOLE Take 1.5 tablets (15 mg total) by mouth daily. What changed: how much to take Changed by: Scarlette Shorts, MD   ONE-A-DAY VITACRAVES ADULT PO Take 1 tablet by mouth daily.   potassium chloride 10 MEQ tablet Commonly known as: KLOR-CON TAKE 1 TABLET BY MOUTH EVERY DAY   Torsemide 40 MG Tabs Take 20 mg by mouth as needed. With additional doses as needed.          OBJECTIVE:   PHYSICAL EXAM: VS: BP 120/76 (BP Location: Left Arm, Patient Position: Sitting, Cuff Size: Large)    Pulse 76    Ht 5\' 8"  (1.727 m)    Wt 226 lb (102.5 kg)    SpO2 94%    BMI 34.36  kg/m    EXAM: General: Pt appears well and is in NAD  Eyes: External eye exam normal with a stare,but no lid lag , right eye exophthalmos noted   Neck: General: Supple without adenopathy. Thyroid: Thyroid size normal. Right lobe prominent   Lungs: Clear with good BS bilat with no rales, rhonchi, or wheezes  Heart: Auscultation: RRR.  Abdomen: Normoactive bowel sounds, soft, nontender, without masses or organomegaly palpable  Extremities:  BL LE: No pretibial edema normal ROM and strength.  Mental Status: Judgment, insight: Intact Orientation: Oriented to time, place, and person Mood and affect: No depression, anxiety, or agitation     DATA REVIEWED:    Latest Reference Range & Units 03/10/21 10:10  TSH 0.35 - 5.50 uIU/mL <0.01 (L)  T4,Free(Direct) 0.60 - 1.60 ng/dL 03/12/21     [name], Branch" (MRN Penelope Coop) as of 07/04/2020 07:29  Ref. Range 04/03/2020 11:55  TRAB Latest Ref Range: <=2.00 IU/L 34.33 (H)     Thyroid Ultrasound 04/24/2020  Moderately heterogeneous and enlarged thyroid gland without evidence of focal nodules.  ASSESSMENT / PLAN / RECOMMENDATIONS:   Hyperthyroidism Secondary to Graves' Disease :   - Pt is clinically euthyroid  - No local neck symptoms  - TFT's today show suppressed TSH again, will increase Methimazole as below   Medications   Increase methimazole 10 mg, 1.5 tablet daily     2. Graves' Disease:   - No extrathyroidal manifestations of Graves' Disease, pt advised to let his ophthalmologist know about Graves' disease   F/U in 4 months      Signed electronically by: 04/26/2020, MD  [facility]  [facility] [address] [place] [place], [place] Phone: [phone] FAX: [phone]      CC: [phone], NP [address] [place] Phone: [phone]  Fax: [phone]   Return to Endocrinology clinic as below: Future  Appointments  Date Time Provider Department Center  07/07/2021  9:30 AM Promise Bushong, 07/09/2021, MD [facility] None  10/13/2021 10:00 AM [place], 12/13/2021, PA-C [facility] None

## 2021-03-11 ENCOUNTER — Telehealth: Payer: Self-pay | Admitting: Internal Medicine

## 2021-03-11 LAB — T4, FREE: Free T4: 1.46 ng/dL (ref 0.60–1.60)

## 2021-03-11 LAB — TSH: TSH: <0.01 u[IU]/mL — ABNORMAL LOW (ref 0.35–5.50)

## 2021-03-11 MED ORDER — METHIMAZOLE 10 MG PO TABS
15.0000 mg | ORAL_TABLET | Freq: Every day | ORAL | 2 refills | Status: DC
Start: 2021-03-11 — End: 2021-07-07

## 2021-03-11 NOTE — Telephone Encounter (Signed)
PLease let the pt know that his thyroid is slightly overactive and to increase Methimazole from 1 tablet a day to one AND a HALF methimazole daily     Thanks

## 2021-03-12 NOTE — Telephone Encounter (Signed)
VM and mychart message sent.  

## 2021-06-14 ENCOUNTER — Other Ambulatory Visit (HOSPITAL_COMMUNITY): Payer: Self-pay | Admitting: Student

## 2021-07-07 ENCOUNTER — Ambulatory Visit (INDEPENDENT_AMBULATORY_CARE_PROVIDER_SITE_OTHER): Payer: Self-pay | Admitting: Internal Medicine

## 2021-07-07 ENCOUNTER — Encounter: Payer: Self-pay | Admitting: Internal Medicine

## 2021-07-07 VITALS — BP 120/82 | HR 86 | Ht 68.0 in | Wt 226.0 lb

## 2021-07-07 DIAGNOSIS — E0590 Thyrotoxicosis, unspecified without thyrotoxic crisis or storm: Secondary | ICD-10-CM

## 2021-07-07 DIAGNOSIS — E0500 Thyrotoxicosis with diffuse goiter without thyrotoxic crisis or storm: Secondary | ICD-10-CM

## 2021-07-07 LAB — TSH: TSH: 0.01 u[IU]/mL — ABNORMAL LOW (ref 0.35–5.50)

## 2021-07-07 LAB — T4, FREE: Free T4: 1.03 ng/dL (ref 0.60–1.60)

## 2021-07-07 MED ORDER — METHIMAZOLE 10 MG PO TABS: 10.0000 mg | ORAL_TABLET | ORAL | 3 refills | Status: DC

## 2021-07-08 LAB — T3: T3, Total: 110 ng/dL (ref 76–181)

## 2021-10-13 ENCOUNTER — Ambulatory Visit: Payer: Self-pay | Admitting: Student

## 2022-01-08 ENCOUNTER — Ambulatory Visit: Payer: Commercial Managed Care - HMO | Admitting: Internal Medicine

## 2022-01-08 ENCOUNTER — Encounter: Payer: Self-pay | Admitting: Internal Medicine

## 2022-01-08 VITALS — BP 136/82 | HR 83 | Ht 68.0 in | Wt 225.0 lb

## 2022-01-08 DIAGNOSIS — E0590 Thyrotoxicosis, unspecified without thyrotoxic crisis or storm: Secondary | ICD-10-CM | POA: Diagnosis not present

## 2022-01-08 DIAGNOSIS — E0500 Thyrotoxicosis with diffuse goiter without thyrotoxic crisis or storm: Secondary | ICD-10-CM

## 2022-01-08 LAB — CBC WITH DIFFERENTIAL/PLATELET
Basophils Absolute: 0 10*3/uL (ref 0.0–0.1)
Basophils Relative: 0.4 % (ref 0.0–3.0)
Eosinophils Absolute: 0 10*3/uL (ref 0.0–0.7)
Eosinophils Relative: 0.5 % (ref 0.0–5.0)
HCT: 47.1 % (ref 39.0–52.0)
Hemoglobin: 16.3 g/dL (ref 13.0–17.0)
Lymphocytes Relative: 30.3 % (ref 12.0–46.0)
Lymphs Abs: 2.7 10*3/uL (ref 0.7–4.0)
MCHC: 34.7 g/dL (ref 30.0–36.0)
MCV: 93.6 fl (ref 78.0–100.0)
Monocytes Absolute: 0.8 10*3/uL (ref 0.1–1.0)
Monocytes Relative: 9 % (ref 3.0–12.0)
Neutro Abs: 5.4 10*3/uL (ref 1.4–7.7)
Neutrophils Relative %: 59.8 % (ref 43.0–77.0)
Platelets: 292 10*3/uL (ref 150.0–400.0)
RBC: 5.03 Mil/uL (ref 4.22–5.81)
RDW: 12.7 % (ref 11.5–15.5)
WBC: 9 10*3/uL (ref 4.0–10.5)

## 2022-01-08 LAB — COMPREHENSIVE METABOLIC PANEL
ALT: 49 U/L (ref 0–53)
AST: 42 U/L — ABNORMAL HIGH (ref 0–37)
Albumin: 4.3 g/dL (ref 3.5–5.2)
Alkaline Phosphatase: 66 U/L (ref 39–117)
BUN: 15 mg/dL (ref 6–23)
CO2: 29 mEq/L (ref 19–32)
Calcium: 9.6 mg/dL (ref 8.4–10.5)
Chloride: 100 mEq/L (ref 96–112)
Creatinine, Ser: 0.58 mg/dL (ref 0.40–1.50)
GFR: 111.93 mL/min (ref >60.00)
Glucose, Bld: 158 mg/dL — ABNORMAL HIGH (ref 70–99)
Potassium: 3.4 mEq/L — ABNORMAL LOW (ref 3.5–5.1)
Sodium: 138 mEq/L (ref 135–145)
Total Bilirubin: 1 mg/dL (ref 0.2–1.2)
Total Protein: 7.7 g/dL (ref 6.0–8.3)

## 2022-01-08 NOTE — Progress Notes (Signed)
Name: Kevin Branch  MRN/ DOB: [id number], June 15, 1970    Age/ Sex: 51 y.o., male     PCP: Doreene Nest, NP   Reason for Endocrinology Evaluation: Hyperthyroidism     Initial Endocrinology Clinic Visit: 04/03/2020    PATIENT IDENTIFIER: Kevin Branch is a 51 y.o., male with a past medical history of A. Fib ( S/P Ablation ) and hyperthyroidism. He has followed with [facility] since 04/03/2020 for consultative assistance with management of his Hyperthyroidism.      HISTORICAL SUMMARY:  He has diagnosed with hyperthyroidism during his admission for recurrent A. Fib in 03/2020 with a suppressed TSH 0.000 uIU/mL and elevated FT4 at > 5.5 ng/dL . He was started on B-blocker and Methimazole    Thyroid ultrasound was unrevealing 04/2020  TRAB elevated at 34.33 IU/L   Follows with Cardiology - Dr. Rosemary Holms No prior amiodarone use   Mother with thyroid disease  SUBJECTIVE:    Today (01/08/2022):  Kevin Branch is here for hyperthyroidism.   Weight has been stable  Denies palpitations  Denies constipation or diarrhea  No eye burning or itching unless mowing  Denies local neck symptoms  Had an  eye exam 7/31 st   Methimazole 10 mg , 2 tabs sat, sun and Mondays, 1 tab rest of the week     HISTORY:  Past Medical History:  Past Medical History:  Diagnosis Date   A-fib (HCC)    Essential hypertension    Hyperglycemia    Hyperlipidemia    Overweight    Typical atrial flutter (HCC)    Past Surgical History:  Past Surgical History:  Procedure Laterality Date   A-FLUTTER ABLATION N/A 03/03/2019   Procedure: A-FLUTTER ABLATION;  Surgeon: Hillis Range, MD;  [facility]: [facility];  Service: Cardiovascular;  Laterality: N/A;   none     Social History:  reports that he has been smoking cigars. He has quit using smokeless tobacco.  His smokeless tobacco use included snuff. He reports that he does not currently use alcohol. He reports that he does  not use drugs. Family History:  Family History  Problem Relation Age of Onset   Hyperlipidemia Mother    Heart disease Mother    Diabetes Mother    Hyperlipidemia Father    Hypertension Sister    Hyperlipidemia Sister      HOME MEDICATIONS: Allergies as of 01/08/2022   No Known Allergies      Medication List        Accurate as of January 08, 2022 10:49 AM. If you have any questions, ask your nurse or doctor.          STOP taking these medications    ascorbic acid 500 MG tablet Commonly known as: VITAMIN C Stopped by: Scarlette Shorts, MD   dapagliflozin propanediol 10 MG Tabs tablet Commonly known as: [place] Stopped by: Scarlette Shorts, MD   diltiazem 180 MG 24 hr capsule Commonly known as: CARDIZEM CD Stopped by: Scarlette Shorts, MD   Eliquis 5 MG Tabs tablet Generic drug: apixaban Stopped by: Scarlette Shorts, MD   Torsemide 40 MG Tabs Stopped by: Scarlette Shorts, MD       TAKE these medications    acetaminophen 325 MG tablet Commonly known as: TYLENOL Take 650 mg by mouth every 6 (six) hours as needed for mild pain.   calcium carbonate 500 MG chewable tablet Commonly known as: TUMS - dosed in mg  elemental calcium Chew 2 tablets by mouth daily as needed for indigestion or heartburn.   Entresto 24-26 MG Generic drug: sacubitril-valsartan TAKE 1 TABLET BY MOUTH TWICE A DAY   latanoprost 0.005 % ophthalmic solution Commonly known as: XALATAN SMARTSIG:1 Drop(s) In Eye(s) Every Evening   methimazole 10 MG tablet Commonly known as: TAPAZOLE Take 1 tablet (10 mg total) by mouth as directed. 2 tabs Saturday, Sunday, and Monday.  1 tablet rest of the week   ONE-A-DAY VITACRAVES ADULT PO Take 1 tablet by mouth daily.   potassium chloride 10 MEQ tablet Commonly known as: KLOR-CON TAKE 1 TABLET BY MOUTH EVERY DAY          OBJECTIVE:   PHYSICAL EXAM: VS: BP 136/82 (BP Location: Left Arm, Patient Position:  Sitting, Cuff Size: Large)   Pulse 83   Ht 5\' 8"  (1.727 m)   Wt 225 lb (102.1 kg)   SpO2 98%   BMI 34.21 kg/m    EXAM: General: Pt appears well and is in NAD  Eyes: External eye exam normal with a stare,but no lid lag , right eye exophthalmos noted   Neck: General: Supple without adenopathy. Thyroid: Thyroid size normal. Right lobe prominent   Lungs: Clear with good BS bilat with no rales, rhonchi, or wheezes  Heart: Auscultation: RRR.  Abdomen: Normoactive bowel sounds, soft, nontender, without masses or organomegaly palpable  Extremities:  BL LE: No pretibial edema normal ROM and strength.  Mental Status: Judgment, insight: Intact Orientation: Oriented to time, place, and person Mood and affect: No depression, anxiety, or agitation     DATA REVIEWED:    [name] Branch, Kevin Branch" (MRN Penelope Coop) as of 07/04/2020 07:29  Ref. Range 04/03/2020 11:55  TRAB Latest Ref Range: <=2.00 IU/L 34.33 (H)    Latest Reference Range & Units 01/08/22 11:09  Sodium 135 - 145 mEq/L 138  Potassium 3.5 - 5.1 mEq/L 3.4 (L)  Chloride 96 - 112 mEq/L 100  CO2 19 - 32 mEq/L 29  Glucose 70 - 99 mg/dL 01/10/22 (H)  BUN 6 - 23 mg/dL 15  Creatinine 711 - 6.57 mg/dL 9.03  Calcium 8.4 - 8.33 mg/dL 9.6  Alkaline Phosphatase 39 - 117 U/L 66  Albumin 3.5 - 5.2 g/dL 4.3  AST 0 - 37 U/L 42 (H)  ALT 0 - 53 U/L 49  Total Protein 6.0 - 8.3 g/dL 7.7    Latest Reference Range & Units 01/08/22 11:09  WBC 4.0 - 10.5 K/uL 9.0  RBC 4.22 - 5.81 Mil/uL 5.03  Hemoglobin 13.0 - 17.0 g/dL 01/10/22  HCT 29.1 - 91.6 % 47.1  MCV 78.0 - 100.0 fl 93.6  MCHC 30.0 - 36.0 g/dL 60.6  RDW 00.4 - 59.9 % 12.7  Platelets 150.0 - 400.0 K/uL 292.0  Neutrophils 43.0 - 77.0 % 59.8  Lymphocytes 12.0 - 46.0 % 30.3  Monocytes Relative 3.0 - 12.0 % 9.0  Eosinophil 0.0 - 5.0 % 0.5  Basophil 0.0 - 3.0 % 0.4  NEUT# 1.4 - 7.7 K/uL 5.4  Lymphocyte # 0.7 - 4.0 K/uL 2.7  Monocyte # 0.1 - 1.0 K/uL 0.8  Eosinophils Absolute 0.0 -  0.7 K/uL 0.0  Basophils Absolute 0.0 - 0.1 K/uL 0.0    Latest Reference Range & Units 01/08/22 11:09  TSH 0.35 - 5.50 uIU/mL 0.01 (L)  Triiodothyronine (T3) 76 - 181 ng/dL 01/10/22  142) L9,RVUY(EBXIDH - 1.60 ng/dL 6.86      Thyroid Ultrasound 04/24/2020  Moderately heterogeneous and enlarged  thyroid gland without evidence of focal nodules.  ASSESSMENT / PLAN / RECOMMENDATIONS:   Hyperthyroidism Secondary to Graves' Disease :   - Pt is clinically euthyroid  - No local neck symptoms  - We extensively discussed the various treatment options for hyperthyroidism and Graves disease including ablation therapy with radioactive iodine versus antithyroid drug treatment versus surgical therapy.  We discussed the various possible benefits versus side effects of the various therapies.   - I carefully explained to the patient that one of the consequences of I-131 ablation treatment would likely be permanent hypothyroidism which would require long-term replacement therapy with LT4.  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 Graves' Disease:   -Patient with a stare, patient with bilateral conjunctival injection.  Pt advised to let his ophthalmologist know about Graves' disease    F/U in 6 months      Signed electronically by: Lyndle Herrlich, MD  [facility] Endocrin[facility]  [facility] [address] [place] [place], [place]  [address] Phone: [phone] FAX: [phone]      CC: Doreene Nest, NP [address] [place] [address] Phone: [phone]  Fax: [phone]   Return to Endocrinology clinic as below: Future Appointments  Date Time Provider Department Center  01/08/2022 10:50 AM Kevin Branch, Konrad Dolores, MD [facility] None

## 2022-01-09 ENCOUNTER — Ambulatory Visit: Payer: Self-pay | Admitting: Internal Medicine

## 2022-01-09 LAB — TSH: TSH: 0.01 u[IU]/mL — ABNORMAL LOW (ref 0.35–5.50)

## 2022-01-09 LAB — T4, FREE: Free T4: 1.33 ng/dL (ref 0.60–1.60)

## 2022-01-09 LAB — T3: T3, Total: 163 ng/dL (ref 76–181)

## 2022-01-09 MED ORDER — METHIMAZOLE 10 MG PO TABS: 10.0000 mg | ORAL_TABLET | ORAL | 3 refills | Status: DC

## 2022-02-19 ENCOUNTER — Encounter (HOSPITAL_COMMUNITY): Payer: Self-pay | Admitting: *Deleted

## 2022-07-08 ENCOUNTER — Ambulatory Visit: Payer: Commercial Managed Care - HMO | Admitting: Internal Medicine

## 2022-07-08 NOTE — Progress Notes (Deleted)
Name: TARRENCE Branch  MRN/ DOB: [id number], 04-22-1970    Age/ Sex: 52 y.o., male     PCP: Doreene Nest, NP   Reason for Endocrinology Evaluation: Hyperthyroidism     Initial Endocrinology Clinic Visit: 04/03/2020    PATIENT IDENTIFIER: Kevin Branch is a 52 y.o., male with a past medical history of A. Fib ( S/P Ablation ) and hyperthyroidism. He has followed with [facility] since 04/03/2020 for consultative assistance with management of his Hyperthyroidism.      HISTORICAL SUMMARY:  He has diagnosed with hyperthyroidism during his admission for recurrent A. Fib in 03/2020 with a suppressed TSH 0.000 uIU/mL and elevated FT4 at > 5.5 ng/dL . He was started on B-blocker and Methimazole    Thyroid ultrasound was unrevealing 04/2020  TRAB elevated at 34.33 IU/L   Follows with Cardiology - Dr. Rosemary Holms No prior amiodarone use   Mother with thyroid disease  SUBJECTIVE:    Today (07/08/2022):  [name] is here for hyperthyroidism.   Weight has been stable  Denies palpitations  Denies constipation or diarrhea  No eye burning or itching unless mowing  Denies local neck symptoms  Had an  eye exam 7/31 st   Methimazole 10 mg , 2 tabs sat, sun and Mondays, 1 tab rest of the week     HISTORY:  Past Medical History:  Past Medical History:  Diagnosis Date   A-fib (HCC)    Essential hypertension    Hyperglycemia    Hyperlipidemia    Overweight    Typical atrial flutter (HCC)    Past Surgical History:  Past Surgical History:  Procedure Laterality Date   A-FLUTTER ABLATION N/A 03/03/2019   Procedure: A-FLUTTER ABLATION;  Surgeon: Hillis Range, MD;  [facility];  Service: Cardiovascular;  Laterality: N/A;   none     Social History:  reports that he has been smoking cigars. He has quit using smokeless tobacco.  His smokeless tobacco use included snuff. He reports that he does not currently use alcohol. He reports that he does  not use drugs. Family History:  Family History  Problem Relation Age of Onset   Hyperlipidemia Mother    Heart disease Mother    Diabetes Mother    Hyperlipidemia Father    Hypertension Sister    Hyperlipidemia Sister      HOME MEDICATIONS: Allergies as of 07/08/2022   No Known Allergies      Medication List        Accurate as of July 08, 2022  1:14 PM. If you have any questions, ask your nurse or doctor.          acetaminophen 325 MG tablet Commonly known as: TYLENOL Take 650 mg by mouth every 6 (six) hours as needed for mild pain.   calcium carbonate 500 MG chewable tablet Commonly known as: TUMS - dosed in mg elemental calcium Chew 2 tablets by mouth daily as needed for indigestion or heartburn.   Entresto 24-26 MG Generic drug: sacubitril-valsartan TAKE 1 TABLET BY MOUTH TWICE A DAY   latanoprost 0.005 % ophthalmic solution Commonly known as: XALATAN SMARTSIG:1 Drop(s) In Eye(s) Every Evening   methimazole 10 MG tablet Commonly known as: TAPAZOLE Take 1 tablet (10 mg total) by mouth as directed. 2 tabs Saturday, Sunday, and Monday.  1 tablet rest of the week   ONE-A-DAY VITACRAVES ADULT PO Take 1 tablet by mouth daily.   potassium chloride 10 MEQ tablet  Commonly known as: KLOR-CON TAKE 1 TABLET BY MOUTH EVERY DAY          OBJECTIVE:   PHYSICAL EXAM: VS: There were no vitals taken for this visit.   EXAM: General: Pt appears well and is in NAD  Eyes: External eye exam normal with a stare,but no lid lag , right eye exophthalmos noted   Neck: General: Supple without adenopathy. Thyroid: Thyroid size normal. Right lobe prominent   Lungs: Clear with good BS bilat with no rales, rhonchi, or wheezes  Heart: Auscultation: RRR.  Abdomen: Normoactive bowel sounds, soft, nontender, without masses or organomegaly palpable  Extremities:  BL LE: No pretibial edema normal ROM and strength.  Mental Status: Judgment, insight: Intact Orientation: Oriented  to time, place, and person Mood and affect: No depression, anxiety, or agitation     DATA REVIEWED:    Results for LAZAR, TIERCE" (MRN [id number]) as of 07/04/2020 07:29  Ref. Range 04/03/2020 11:55  TRAB Latest Ref Range: <=2.00 IU/L 34.33 (H)    Latest Reference Range & Units 01/08/22 11:09  Sodium 135 - 145 mEq/L 138  Potassium 3.5 - 5.1 mEq/L 3.4 (L)  Chloride 96 - 112 mEq/L 100  CO2 19 - 32 mEq/L 29  Glucose 70 - 99 mg/dL 409 (H)  BUN 6 - 23 mg/dL 15  Creatinine 8.11 - 9.14 mg/dL 7.82  Calcium 8.4 - 95.6 mg/dL 9.6  Alkaline Phosphatase 39 - 117 U/L 66  Albumin 3.5 - 5.2 g/dL 4.3  AST 0 - 37 U/L 42 (H)  ALT 0 - 53 U/L 49  Total Protein 6.0 - 8.3 g/dL 7.7    Latest Reference Range & Units 01/08/22 11:09  WBC 4.0 - 10.5 K/uL 9.0  RBC 4.22 - 5.81 Mil/uL 5.03  Hemoglobin 13.0 - 17.0 g/dL 21.3  HCT 08.6 - 57.8 % 47.1  MCV 78.0 - 100.0 fl 93.6  MCHC 30.0 - 36.0 g/dL 46.9  RDW 62.9 - 52.8 % 12.7  Platelets 150.0 - 400.0 K/uL 292.0  Neutrophils 43.0 - 77.0 % 59.8  Lymphocytes 12.0 - 46.0 % 30.3  Monocytes Relative 3.0 - 12.0 % 9.0  Eosinophil 0.0 - 5.0 % 0.5  Basophil 0.0 - 3.0 % 0.4  NEUT# 1.4 - 7.7 K/uL 5.4  Lymphocyte # 0.7 - 4.0 K/uL 2.7  Monocyte # 0.1 - 1.0 K/uL 0.8  Eosinophils Absolute 0.0 - 0.7 K/uL 0.0  Basophils Absolute 0.0 - 0.1 K/uL 0.0    Latest Reference Range & Units 01/08/22 11:09  TSH 0.35 - 5.50 uIU/mL 0.01 (L)  Triiodothyronine (T3) 76 - 181 ng/dL 413  K4,MWNU(UVOZDG) 6.44 - 1.60 ng/dL 0.34      Thyroid Ultrasound 04/24/2020  Moderately heterogeneous and enlarged thyroid gland without evidence of focal nodules.  ASSESSMENT / PLAN / RECOMMENDATIONS:   Hyperthyroidism Secondary to Graves' Disease :   - Pt is clinically euthyroid  - No local neck symptoms  - We extensively discussed the various treatment options for hyperthyroidism and Graves disease including ablation therapy with radioactive iodine versus antithyroid drug  treatment versus surgical therapy.  We discussed the various possible benefits versus side effects of the various therapies.   - I carefully explained to the patient that one of the consequences of I-131 ablation treatment would likely be permanent hypothyroidism which would require long-term replacement therapy with LT4.  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 Graves' Disease:   -Patient with a stare, patient with bilateral conjunctival injection.  Pt advised to let his ophthalmologist know about Graves' disease    F/U in 6 months      Signed electronically by: Lyndle Herrlich, MD  [facility]  [facility] [address] [place] [place], [place] [address] Phone: [phone] FAX: [phone]      CC: Doreene Nest, NP [address] [place] [address] Phone: [phone]  Fax: [phone]   Return to Endocrinology clinic as below: Future Appointments  Date Time Provider Department Center  07/08/2022  3:00 PM Nehemias Sauceda, Konrad Dolores, MD [facility] None

## 2023-02-10 ENCOUNTER — Other Ambulatory Visit: Payer: Self-pay | Admitting: Internal Medicine

## 2023-12-15 ENCOUNTER — Other Ambulatory Visit

## 2023-12-15 ENCOUNTER — Ambulatory Visit: Admitting: Internal Medicine

## 2023-12-15 ENCOUNTER — Encounter: Payer: Self-pay | Admitting: Internal Medicine

## 2023-12-15 VITALS — BP 150/80 | Ht 68.0 in | Wt 193.0 lb

## 2023-12-15 DIAGNOSIS — E0500 Thyrotoxicosis with diffuse goiter without thyrotoxic crisis or storm: Secondary | ICD-10-CM | POA: Diagnosis not present

## 2023-12-15 DIAGNOSIS — E0590 Thyrotoxicosis, unspecified without thyrotoxic crisis or storm: Secondary | ICD-10-CM

## 2023-12-15 LAB — CBC
HCT: 41.3 % (ref 39.4–51.1)
Hemoglobin: 14.2 g/dL (ref 13.2–17.1)
MCH: 31.4 pg (ref 27.0–33.0)
MCHC: 34.4 g/dL (ref 31.6–35.4)
MCV: 91.4 fL (ref 81.4–101.7)
MPV: 11.1 fL (ref 7.5–12.5)
Platelets: 220 Thousand/uL (ref 140–400)
RBC: 4.52 Million/uL (ref 4.20–5.80)
RDW: 12.1 % (ref 11.0–15.0)
WBC: 7.3 Thousand/uL (ref 3.8–10.8)

## 2023-12-15 LAB — HEPATIC FUNCTION PANEL
AG Ratio: 1 (calc) (ref 1.0–2.5)
ALT: 39 U/L (ref 9–46)
AST: 39 U/L — ABNORMAL HIGH (ref 10–35)
Albumin: 3.6 g/dL (ref 3.6–5.1)
Alkaline phosphatase (APISO): 146 U/L — ABNORMAL HIGH (ref 35–144)
Bilirubin, Direct: 0.4 mg/dL — ABNORMAL HIGH (ref 0.0–0.2)
Globulin: 3.5 g/dL (ref 1.9–3.7)
Indirect Bilirubin: 0.7 mg/dL (ref 0.2–1.2)
Total Bilirubin: 1.1 mg/dL (ref 0.2–1.2)
Total Protein: 7.1 g/dL (ref 6.1–8.1)

## 2023-12-15 LAB — T3, FREE: T3, Free: >20 pg/mL — ABNORMAL HIGH (ref 2.3–4.2)

## 2023-12-15 LAB — T4, FREE: Free T4: 5.4 ng/dL — ABNORMAL HIGH (ref 0.8–1.8)

## 2023-12-15 LAB — TSH: TSH: <0.01 m[IU]/L — ABNORMAL LOW (ref 0.40–4.50)

## 2023-12-15 NOTE — Progress Notes (Unsigned)
 Name: Kevin Branch  MRN/ DOB: [id number], 1970/08/04    Age/ Sex: 53 y.o., male     PCP: Gretta Comer POUR, NP   Reason for Endocrinology Evaluation: Hyperthyroidism     Initial Endocrinology Clinic Visit: 04/03/2020    PATIENT IDENTIFIER: Kevin Branch is a 53 y.o., male with a past medical history of A. Fib ( S/P Ablation ) and hyperthyroidism. He has followed with [facility] since 04/03/2020 for consultative assistance with management of his Hyperthyroidism.      HISTORICAL SUMMARY:  He has diagnosed with hyperthyroidism during his admission for recurrent A. Fib in 03/2020 with a suppressed TSH 0.000 uIU/mL and elevated FT4 at > 5.5 ng/dL . He was started on B-blocker and Methimazole     Thyroid  ultrasound was unrevealing 04/2020  TRAB elevated at 34.33 IU/L   Follows with Cardiology - Dr. Elmira No prior amiodarone use   Mother with thyroid  disease  SUBJECTIVE:    Today (12/15/2023):  Kevin Branch is here for hyperthyroidism.   The patient has not been to our clinic in 2 years.  The pt has been noted with weight loss since his last visit here He lost his mother in 06/2022 , he also changed jobs and did not have enough time off Over the past year, the patient has been rationing methimazole  by taking 1 tablet every 2 days.  He did have an episode of palpitations, and his watch registered A-fib while on vacation when he forgot to take methimazole .  Symptoms resolved within 30 minutes of taking methimazole   Recently no palpitations  No local neck swelling  No tremors  No diarrhea or loose stools  No eye symptoms  Has been having insomnia   Last dose of methimazole  Monday     HISTORY:  Past Medical History:  Past Medical History:  Diagnosis Date   A-fib [facility])    Essential hypertension    Hyperglycemia    Hyperlipidemia    Overweight    Typical atrial flutter (HCC)    Past Surgical History:  Past Surgical History:  Procedure  Laterality Date   A-FLUTTER ABLATION N/A 03/03/2019   Procedure: A-FLUTTER ABLATION;  Surgeon: Kelsie Agent, MD;  Location: [facility];  Service: Cardiovascular;  Laterality: N/A;   none     Social History:  reports that he has been smoking cigars. He has quit using smokeless tobacco.  His smokeless tobacco use included snuff. He reports that he does not currently use alcohol. He reports that he does not use drugs. Family History:  Family History  Problem Relation Age of Onset   Hyperlipidemia Mother    Heart disease Mother    Diabetes Mother    Hyperlipidemia Father    Hypertension Sister    Hyperlipidemia Sister      HOME MEDICATIONS: Allergies as of 12/15/2023   No Known Allergies      Medication List        Accurate as of December 15, 2023  1:07 PM. If you have any questions, ask your nurse or doctor.          STOP taking these medications    Entresto  24-26 MG Generic drug: sacubitril -valsartan  Stopped by: Jensen Kilburg J Deion Forgue   potassium chloride  10 MEQ tablet Commonly known as: KLOR-CON  Stopped by: Essex Perry J Azlan Hanway       TAKE these medications    acetaminophen  325 MG tablet Commonly known as: TYLENOL  Take 650 mg by mouth every 6 (six) hours  as needed for mild pain.   calcium carbonate 500 MG chewable tablet Commonly known as: TUMS - dosed in mg elemental calcium Chew 2 tablets by mouth daily as needed for indigestion or heartburn.   latanoprost 0.005 % ophthalmic solution Commonly known as: XALATAN SMARTSIG:1 Drop(s) In Eye(s) Every Evening   methimazole  10 MG tablet Commonly known as: TAPAZOLE  Take 1 tablet (10 mg total) by mouth as directed. 2 tabs Saturday, Sunday, and Monday.  1 tablet rest of the week   ONE-A-DAY VITACRAVES ADULT PO Take 1 tablet by mouth daily.          OBJECTIVE:   PHYSICAL EXAM: VS: BP (!) 150/80   Ht 5' 8 (1.727 m)   Wt 193 lb (87.5 kg)   BMI 29.35 kg/m    EXAM: General: Pt appears well and  is in NAD  Eyes: External eye exam normal with a stare,but no lid lag , right eye exophthalmos noted   Neck:  Thyroid :  Right lobe prominent .  No bruits  Lungs: Clear with good BS bilat   Heart: Auscultation: RRR.  Extremities:  BL LE: No pretibial edema   Mental Status: Judgment, insight: Intact Orientation: Oriented to time, place, and person Mood and affect: No depression, anxiety, or agitation     DATA REVIEWED:  ****   [name] Branch, Kevin Branch (MRN [id number]) as of 07/04/2020 07:29  Ref. Range 04/03/2020 11:55  TRAB Latest Ref Range: <=2.00 IU/L 34.33 (H)    Thyroid  Ultrasound 04/24/2020  Moderately heterogeneous and enlarged thyroid  gland without evidence of focal nodules.  ASSESSMENT / PLAN / RECOMMENDATIONS:   Hyperthyroidism Secondary to Graves' Disease :  -Patient is clinically euthyroid - We had discussed alternative definitive therapy in the past, I did not recommend RAI ablation due to Graves' orbitopathy, patient is hesitant about proceeding with total thyroidectomy. -  Medications  Take methimazole  10 mg,    2. Graves' Disease:   - Patient with Graves' orbitopathy -Asymptomatic at this time    F/U in 3 months      Signed electronically by: Stefano Redgie Butts, MD  [facility]  [facility] [address] [place] [place], [place] [address] Phone: [phone] FAX: [phone]      CC: Gretta Comer POUR, NP [address] [place] [address] Phone: [phone]  Fax: [phone]   Return to Endocrinology clinic as below: No future appointments.

## 2023-12-16 ENCOUNTER — Ambulatory Visit: Payer: Self-pay | Admitting: Internal Medicine

## 2023-12-16 MED ORDER — METHIMAZOLE 10 MG PO TABS: 20.0000 mg | ORAL_TABLET | Freq: Every day | ORAL | 1 refills | Status: AC

## 2023-12-16 NOTE — Telephone Encounter (Signed)
 Please contact the patient and let him know that his thyroid  is extremely overactive.   He needs to start taking methimazole  2 tablets a day, a new prescription will be sent to his pharmacy    Please schedule him for repeat labs in 5 weeks    Thanks

## 2024-01-20 ENCOUNTER — Other Ambulatory Visit

## 2024-03-17 ENCOUNTER — Ambulatory Visit: Admitting: Internal Medicine
